# Patient Record
Sex: Male | Born: 1935 | Race: Black or African American | Hispanic: No | Marital: Married | Smoking: Current some day smoker
Health system: Southern US, Community
[De-identification: ages and names within clinical notes are randomized; demographics above are authoritative.]

## PROBLEM LIST (undated history)

## (undated) DIAGNOSIS — D72829 Elevated white blood cell count, unspecified: Secondary | ICD-10-CM

## (undated) DIAGNOSIS — N1330 Unspecified hydronephrosis: Secondary | ICD-10-CM

## (undated) DIAGNOSIS — Z992 Dependence on renal dialysis: Secondary | ICD-10-CM

## (undated) DIAGNOSIS — E0590 Thyrotoxicosis, unspecified without thyrotoxic crisis or storm: Secondary | ICD-10-CM

## (undated) DIAGNOSIS — R339 Retention of urine, unspecified: Secondary | ICD-10-CM

## (undated) DIAGNOSIS — A530 Latent syphilis, unspecified as early or late: Secondary | ICD-10-CM

## (undated) DIAGNOSIS — J189 Pneumonia, unspecified organism: Secondary | ICD-10-CM

## (undated) DIAGNOSIS — G9340 Encephalopathy, unspecified: Secondary | ICD-10-CM

## (undated) DIAGNOSIS — Z978 Presence of other specified devices: Secondary | ICD-10-CM

## (undated) DIAGNOSIS — R262 Difficulty in walking, not elsewhere classified: Secondary | ICD-10-CM

## (undated) DIAGNOSIS — M6281 Muscle weakness (generalized): Secondary | ICD-10-CM

## (undated) DIAGNOSIS — N390 Urinary tract infection, site not specified: Secondary | ICD-10-CM

## (undated) DIAGNOSIS — N320 Bladder-neck obstruction: Secondary | ICD-10-CM

## (undated) DIAGNOSIS — K5900 Constipation, unspecified: Secondary | ICD-10-CM

## (undated) DIAGNOSIS — Z960 Presence of urogenital implants: Secondary | ICD-10-CM

## (undated) DIAGNOSIS — N2589 Other disorders resulting from impaired renal tubular function: Secondary | ICD-10-CM

## (undated) DIAGNOSIS — E46 Unspecified protein-calorie malnutrition: Secondary | ICD-10-CM

## (undated) DIAGNOSIS — N179 Acute kidney failure, unspecified: Secondary | ICD-10-CM

## (undated) DIAGNOSIS — D649 Anemia, unspecified: Secondary | ICD-10-CM

---

## 2015-06-01 DIAGNOSIS — J189 Pneumonia, unspecified organism: Secondary | ICD-10-CM

## 2015-06-01 HISTORY — DX: Pneumonia, unspecified organism: J18.9

## 2015-06-13 ENCOUNTER — Emergency Department (HOSPITAL_COMMUNITY): Payer: Medicare Other

## 2015-06-13 ENCOUNTER — Encounter (HOSPITAL_COMMUNITY): Payer: Self-pay | Admitting: *Deleted

## 2015-06-13 ENCOUNTER — Inpatient Hospital Stay (HOSPITAL_COMMUNITY)
Admission: EM | Admit: 2015-06-13 | Discharge: 2015-06-28 | DRG: 871 | Disposition: A | Discharge status: Skilled Nursing Facility | Payer: Medicare Other | Attending: Internal Medicine | Admitting: Internal Medicine

## 2015-06-13 DIAGNOSIS — G9340 Encephalopathy, unspecified: Secondary | ICD-10-CM | POA: Diagnosis present

## 2015-06-13 DIAGNOSIS — E872 Acidosis: Secondary | ICD-10-CM | POA: Diagnosis present

## 2015-06-13 DIAGNOSIS — Z6823 Body mass index (BMI) 23.0-23.9, adult: Secondary | ICD-10-CM

## 2015-06-13 DIAGNOSIS — N390 Urinary tract infection, site not specified: Secondary | ICD-10-CM | POA: Diagnosis present

## 2015-06-13 DIAGNOSIS — K5900 Constipation, unspecified: Secondary | ICD-10-CM | POA: Diagnosis present

## 2015-06-13 DIAGNOSIS — N320 Bladder-neck obstruction: Secondary | ICD-10-CM | POA: Diagnosis present

## 2015-06-13 DIAGNOSIS — D638 Anemia in other chronic diseases classified elsewhere: Secondary | ICD-10-CM | POA: Diagnosis present

## 2015-06-13 DIAGNOSIS — D72829 Elevated white blood cell count, unspecified: Secondary | ICD-10-CM | POA: Diagnosis present

## 2015-06-13 DIAGNOSIS — D649 Anemia, unspecified: Secondary | ICD-10-CM | POA: Diagnosis present

## 2015-06-13 DIAGNOSIS — R339 Retention of urine, unspecified: Secondary | ICD-10-CM | POA: Diagnosis present

## 2015-06-13 DIAGNOSIS — A530 Latent syphilis, unspecified as early or late: Secondary | ICD-10-CM | POA: Diagnosis present

## 2015-06-13 DIAGNOSIS — R109 Unspecified abdominal pain: Secondary | ICD-10-CM

## 2015-06-13 DIAGNOSIS — Y95 Nosocomial condition: Secondary | ICD-10-CM | POA: Diagnosis not present

## 2015-06-13 DIAGNOSIS — N179 Acute kidney failure, unspecified: Secondary | ICD-10-CM | POA: Diagnosis present

## 2015-06-13 DIAGNOSIS — R509 Fever, unspecified: Secondary | ICD-10-CM | POA: Insufficient documentation

## 2015-06-13 DIAGNOSIS — N1330 Unspecified hydronephrosis: Secondary | ICD-10-CM | POA: Diagnosis present

## 2015-06-13 DIAGNOSIS — R030 Elevated blood-pressure reading, without diagnosis of hypertension: Secondary | ICD-10-CM

## 2015-06-13 DIAGNOSIS — J189 Pneumonia, unspecified organism: Secondary | ICD-10-CM | POA: Diagnosis not present

## 2015-06-13 DIAGNOSIS — A419 Sepsis, unspecified organism: Principal | ICD-10-CM | POA: Diagnosis present

## 2015-06-13 DIAGNOSIS — N401 Enlarged prostate with lower urinary tract symptoms: Secondary | ICD-10-CM | POA: Diagnosis present

## 2015-06-13 DIAGNOSIS — N19 Unspecified kidney failure: Secondary | ICD-10-CM | POA: Insufficient documentation

## 2015-06-13 DIAGNOSIS — R531 Weakness: Secondary | ICD-10-CM | POA: Diagnosis present

## 2015-06-13 DIAGNOSIS — R066 Hiccough: Secondary | ICD-10-CM | POA: Diagnosis not present

## 2015-06-13 DIAGNOSIS — E43 Unspecified severe protein-calorie malnutrition: Secondary | ICD-10-CM | POA: Diagnosis present

## 2015-06-13 DIAGNOSIS — K625 Hemorrhage of anus and rectum: Secondary | ICD-10-CM | POA: Diagnosis present

## 2015-06-13 DIAGNOSIS — E0590 Thyrotoxicosis, unspecified without thyrotoxic crisis or storm: Secondary | ICD-10-CM | POA: Diagnosis present

## 2015-06-13 DIAGNOSIS — J181 Lobar pneumonia, unspecified organism: Secondary | ICD-10-CM | POA: Diagnosis not present

## 2015-06-13 DIAGNOSIS — R4182 Altered mental status, unspecified: Secondary | ICD-10-CM

## 2015-06-13 DIAGNOSIS — IMO0001 Reserved for inherently not codable concepts without codable children: Secondary | ICD-10-CM | POA: Diagnosis present

## 2015-06-13 DIAGNOSIS — R630 Anorexia: Secondary | ICD-10-CM | POA: Diagnosis present

## 2015-06-13 DIAGNOSIS — E440 Moderate protein-calorie malnutrition: Secondary | ICD-10-CM | POA: Diagnosis present

## 2015-06-13 HISTORY — DX: Constipation, unspecified: K59.00

## 2015-06-13 LAB — URINALYSIS, ROUTINE W REFLEX MICROSCOPIC
Glucose, UA: NEGATIVE mg/dL
KETONES UR: 15 mg/dL — AB
NITRITE: POSITIVE — AB
PH: 7 (ref 5.0–8.0)
Protein, ur: 100 mg/dL — AB
Specific Gravity, Urine: 1.012 (ref 1.005–1.030)

## 2015-06-13 LAB — URINE MICROSCOPIC-ADD ON: Squamous Epithelial / HPF: NONE SEEN

## 2015-06-13 LAB — COMPREHENSIVE METABOLIC PANEL
ALBUMIN: 2.6 g/dL — AB (ref 3.5–5.0)
ALK PHOS: 203 U/L — AB (ref 38–126)
ALT: 62 U/L (ref 17–63)
AST: 40 U/L (ref 15–41)
Anion gap: 24 — ABNORMAL HIGH (ref 5–15)
BILIRUBIN TOTAL: 1.3 mg/dL — AB (ref 0.3–1.2)
BUN: 160 mg/dL — AB (ref 6–20)
CALCIUM: 8.8 mg/dL — AB (ref 8.9–10.3)
CO2: 15 mmol/L — AB (ref 22–32)
Chloride: 101 mmol/L (ref 101–111)
Creatinine, Ser: 14.21 mg/dL — ABNORMAL HIGH (ref 0.61–1.24)
GFR calc Af Amer: 3 mL/min — ABNORMAL LOW (ref >60)
GFR calc non Af Amer: 3 mL/min — ABNORMAL LOW (ref >60)
GLUCOSE: 110 mg/dL — AB (ref 65–99)
Potassium: 5 mmol/L (ref 3.5–5.1)
SODIUM: 140 mmol/L (ref 135–145)
TOTAL PROTEIN: 8 g/dL (ref 6.5–8.1)

## 2015-06-13 LAB — CBC
HEMATOCRIT: 34.4 % — AB (ref 39.0–52.0)
HEMOGLOBIN: 11.7 g/dL — AB (ref 13.0–17.0)
MCH: 27.9 pg (ref 26.0–34.0)
MCHC: 34 g/dL (ref 30.0–36.0)
MCV: 82.1 fL (ref 78.0–100.0)
Platelets: 383 10*3/uL (ref 150–400)
RBC: 4.19 MIL/uL — ABNORMAL LOW (ref 4.22–5.81)
RDW: 13.9 % (ref 11.5–15.5)
WBC: 19.3 10*3/uL — ABNORMAL HIGH (ref 4.0–10.5)

## 2015-06-13 LAB — PROTIME-INR
INR: 1.41 (ref 0.00–1.49)
Prothrombin Time: 17.3 s — ABNORMAL HIGH (ref 11.6–15.2)

## 2015-06-13 LAB — LIPASE, BLOOD: Lipase: 54 U/L — ABNORMAL HIGH (ref 11–51)

## 2015-06-13 LAB — LACTIC ACID, PLASMA: Lactic Acid, Venous: 1.1 mmol/L (ref 0.5–2.0)

## 2015-06-13 LAB — APTT: aPTT: 34 seconds (ref 24–37)

## 2015-06-13 MED ORDER — DEXTROSE 5 % IV SOLN
2.0000 g | INTRAVENOUS | Status: DC
  Administered 2015-06-14 – 2015-06-19 (×6): 2 g via INTRAVENOUS
  Filled 2015-06-13 (×8): qty 2

## 2015-06-13 MED ORDER — OXYCODONE HCL 5 MG PO TABS
5.0000 mg | ORAL_TABLET | ORAL | Status: DC | PRN
Start: 2015-06-13 — End: 2015-06-22
  Administered 2015-06-13 – 2015-06-19 (×7): 5 mg via ORAL
  Filled 2015-06-13 (×8): qty 1

## 2015-06-13 MED ORDER — SODIUM CHLORIDE 0.9 % IV SOLN
INTRAVENOUS | Status: DC
  Administered 2015-06-13: 22:00:00 via INTRAVENOUS

## 2015-06-13 MED ORDER — ACETAMINOPHEN 325 MG PO TABS
650.0000 mg | ORAL_TABLET | Freq: Four times a day (QID) | ORAL | Status: DC | PRN
  Administered 2015-06-19 – 2015-06-20 (×3): 650 mg via ORAL
  Filled 2015-06-13 (×3): qty 2

## 2015-06-13 MED ORDER — DEXTROSE 5 % IV SOLN
2.0000 g | Freq: Once | INTRAVENOUS | Status: AC
  Administered 2015-06-13: 2 g via INTRAVENOUS
  Filled 2015-06-13: qty 2

## 2015-06-13 MED ORDER — HYDRALAZINE HCL 20 MG/ML IJ SOLN: 2.0000 mg | Freq: Four times a day (QID) | INTRAMUSCULAR | Status: DC | PRN

## 2015-06-13 MED ORDER — ALUM & MAG HYDROXIDE-SIMETH 200-200-20 MG/5ML PO SUSP: 30.0000 mL | Freq: Four times a day (QID) | ORAL | Status: DC | PRN

## 2015-06-13 MED ORDER — SODIUM CHLORIDE 0.9 % IV BOLUS (SEPSIS)
1000.0000 mL | Freq: Once | INTRAVENOUS | Status: AC
  Administered 2015-06-13: 1000 mL via INTRAVENOUS

## 2015-06-13 MED ORDER — ONDANSETRON HCL 4 MG PO TABS: 4.0000 mg | ORAL_TABLET | Freq: Four times a day (QID) | ORAL | Status: DC | PRN

## 2015-06-13 MED ORDER — ENSURE ENLIVE PO LIQD
237.0000 mL | Freq: Two times a day (BID) | ORAL | Status: DC
  Administered 2015-06-14 – 2015-06-20 (×5): 237 mL via ORAL

## 2015-06-13 MED ORDER — HYDROMORPHONE HCL 1 MG/ML IJ SOLN: 0.5000 mg | INTRAMUSCULAR | Status: DC | PRN

## 2015-06-13 MED ORDER — ACETAMINOPHEN 650 MG RE SUPP: 650.0000 mg | Freq: Four times a day (QID) | RECTAL | Status: DC | PRN

## 2015-06-13 MED ORDER — ONDANSETRON HCL 4 MG/2ML IJ SOLN: 4.0000 mg | Freq: Four times a day (QID) | INTRAMUSCULAR | Status: DC | PRN

## 2015-06-13 NOTE — ED Notes (Signed)
Unsuccessful IV attempt; A Nease CN reports will attempt IV.

## 2015-06-13 NOTE — ED Notes (Signed)
Delay in bladder scan due to portable equipment person taking the scanner and also ultrasound in the room.

## 2015-06-13 NOTE — Progress Notes (Signed)
Pharmacy Antibiotic Note  Jeff Parks is a 82 y.o. male admitted on 06/13/2015 with urinary retention x3days, acute renal failure (SCr 14), and r/o sepsis.  Pharmacy has been consulted for Ceftriaxone dosing for UTI.  Plan:  Ceftriaxone 2g IV q24h  Follow up renal fxn, culture results, and clinical course.  Temp (24hrs), Avg:98.2 F (36.8 C), Min:98.2 F (36.8 C), Max:98.2 F (36.8 C)   Recent Labs Lab 06/13/15 1116  WBC 19.3*  CREATININE 14.21*    CrCl cannot be calculated (Unknown ideal weight.).    No Known Allergies  Antimicrobials this admission: 2/13 >> Ceftriaxone >>  Dose adjustments this admission: None  Microbiology results: 2/13 BCx: 2/13 UCx:   Thank you for allowing pharmacy to be a part of this patient's care.  Lynann Beaver PharmD, BCPS Pager [phone] 06/13/2015 6:27 PM

## 2015-06-13 NOTE — Progress Notes (Signed)
After reviewing orders, noted pt has cardiac monitoring orders. Call out to Dr. Mort Sawyers to see if continued cardiac monitoring is needed. Call back from Dr. Lovell Sheehan, she discontinued cardiac monitoring orders. Pt to remain on med surg 5W unit.

## 2015-06-13 NOTE — H&P (Signed)
[facility] Admission History and Physical       Jeff Parks WUJ:[id number] DOB: Jan 27, 1930 DOA: 06/13/2015   Referring physician: EDP PCP: No primary care provider on file.  Specialists:   Chief Complaint: Unable to Urinate  HPI: Jeff Parks is a 82 y.o. male with no previous medical problems who presents to the ED with complaints of Urinary retention x 3- 4 days.   He reports not being able to urinate for the past 12 hours.   He began to have fever today as well.    He was evaluated in the ED, and a foley catheter was placed in the ED, and his BUN/Cr was elevated, and a Renal US was ordered.   He was referred for admission.      Review of Systems:  Constitutional: No Weight Loss, No Weight Gain, Night Sweats, Fevers, Chills, Dizziness, Light Headedness, Fatigue, or Generalized Weakness HEENT: No Headaches, Difficulty Swallowing,Tooth/Dental Problems,Sore Throat,  No Sneezing, Rhinitis, Ear Ache, Nasal Congestion, or Post Nasal Drip,  Cardio-vascular:  No Chest pain, Orthopnea, PND, Edema in Lower Extremities, Anasarca, Dizziness, Palpitations  Resp: No Dyspnea, No DOE, No Productive Cough, No Non-Productive Cough, No Hemoptysis, No Wheezing.    GI: No Heartburn, Indigestion, +Abdominal Pain, Nausea, Vomiting, Diarrhea, Constipation, Hematemesis, Hematochezia, Melena, Change in Bowel Habits,  +Loss of Appetite  GU: No Dysuria, No Change in Color of Urine, +Urinary Retention, No Urgency or Urinary Frequency, No Flank pain.  Musculoskeletal: No Joint Pain or Swelling, No Decreased Range of Motion, No Back Pain.  Neurologic: No Syncope, No Seizures, Muscle Weakness, Paresthesia, Vision Disturbance or Loss, No Diplopia, No Vertigo, No Difficulty Walking,  Skin: No Rash or Lesions. Psych: No Change in Mood or Affect, No Depression or Anxiety, No Memory loss, No Confusion, or Hallucinations   Past Medical History  Diagnosis Date  . Constipation      History reviewed. No  pertinent past surgical history.    Prior to Admission medications   Medication Sig Start Date End Date Taking? Authorizing Provider  Multiple Vitamins-Minerals (MULTIVITAMIN ADULT PO) Take 1 tablet by mouth daily.   Yes Historical Provider, MD  naproxen sodium (ANAPROX) 220 MG tablet Take 400 mg by mouth every 12 (twelve) hours as needed (PAIN).   Yes Historical Provider, MD     No Known Allergies    Social History:  reports that he has never smoked. He does not have any smokeless tobacco history on file. He reports that he does not drink alcohol or use illicit drugs.     Family History :    Mother had Diabetes  Father had Anemia    Physical Exam:  GEN:  Pleasant Thin Elderly  82 y.o. African American male examined and in no acute distress; cooperative with exam Filed Vitals:   06/13/15 0949 06/13/15 1425  BP: 150/69 150/94  Pulse: 107 105  Temp: 98.2 F (36.8 C)   TempSrc: Oral   Resp: 18 14  SpO2: 98% 100%   Blood pressure 150/94, pulse 105, temperature 98.2 F (36.8 C), temperature source Oral, resp. rate 14, SpO2 100 %. PSYCH: He is alert and oriented x4; does not appear anxious does not appear depressed; affect is normal HEENT: Normocephalic and Atraumatic, Mucous membranes pink; PERRLA; EOM intact; Fundi:  Benign;  No scleral icterus, Nares: Patent, Oropharynx: Clear, Edentulous,    Neck:  FROM, No Cervical Lymphadenopathy nor Thyromegaly or Carotid Bruit; No JVD; Breasts:: Not examined CHEST WALL: No tenderness CHEST: Normal respiration, clear  to auscultation bilaterally HEART: Regular rate and rhythm; no murmurs rubs or gallops BACK: No kyphosis or scoliosis; No CVA tenderness ABDOMEN: Positive Bowel Sounds, Scaphoid, Soft Non-Tender, No Rebound or Guarding; No Masses, No Organomegaly Rectal Exam: Not done EXTREMITIES: No Cyanosis, Clubbing, or Edema; No Ulcerations. Genitalia: not examined PULSES: 2+ and symmetric SKIN: Normal hydration no rash or  ulceration CNS:  Alert and Oriented x 4, No Focal Deficits Vascular: pulses palpable throughout    Labs on Admission:  Basic Metabolic Panel:  Recent Labs Lab 06/13/15 1116  NA 140  K 5.0  CL 101  CO2 15*  GLUCOSE 110*  BUN 160*  CREATININE 14.21*  CALCIUM 8.8*   Liver Function Tests:  Recent Labs Lab 06/13/15 1116  AST 40  ALT 62  ALKPHOS 203*  BILITOT 1.3*  PROT 8.0  ALBUMIN 2.6*    Recent Labs Lab 06/13/15 1116  LIPASE 54*   No results for input(s): AMMONIA in the last 168 hours. CBC:  Recent Labs Lab 06/13/15 1116  WBC 19.3*  HGB 11.7*  HCT 34.4*  MCV 82.1  PLT 383   Cardiac Enzymes: No results for input(s): CKTOTAL, CKMB, CKMBINDEX, TROPONINI in the last 168 hours.  BNP (last 3 results) No results for input(s): BNP in the last 8760 hours.  ProBNP (last 3 results) No results for input(s): PROBNP in the last 8760 hours.  CBG: No results for input(s): GLUCAP in the last 168 hours.  Radiological Exams on Admission: Dg Chest 2 View  06/13/2015  CLINICAL DATA:  Shortness of breath for 5 days, constipation for 5 days, weakness EXAM: CHEST  2 VIEW COMPARISON:  None FINDINGS: Normal heart size and pulmonary vascularity. Tortuous thoracic aorta. Bronchitic changes with LEFT basilar atelectasis. Remaining lungs clear. No definite pleural effusion or pneumothorax. IMPRESSION: Bronchitic changes with LEFT basilar atelectasis. Electronically Signed   By: Ulyses Southward M.D.   On: 06/13/2015 17:31   Dg Abd 2 Views  06/13/2015  CLINICAL DATA:  Shortness of breath for 5 days, constipation for 5 days, weakness EXAM: ABDOMEN - 2 VIEW COMPARISON:  None FINDINGS: Bronchitic changes with subsegmental atelectasis at LEFT base. Normal bowel gas pattern. No bowel dilatation, bowel wall thickening or free intraperitoneal air. Probable nipple shadows on upright view. Numerous pelvic phleboliths. No acute osseous findings or urinary tract calcification. IMPRESSION: No acute  abdominal findings. Electronically Signed   By: Ulyses Southward M.D.   On: 06/13/2015 17:32    EKG:   Sinus Tachycardia rate =104   Assessment/Plan:   82 y.o. male with  Active Problems:       1.    AKI (acute kidney injury) (HCC)/Renal failure- due to BOO    Foley Catheter Placed    IVFs    Monitor BUN/Cr      2.    Urinary retention- due to BOO    Renal US ordered    Check PSA    May Need Urology Consult    Monitor Output      3.    Fever    Sepsis Workup initiated    IV Rocephin      4.    Anorexia    Nutrition consult    5.     Anemia    Send Anemia Panel in AM    6.     Leukocytosis- due to Early Sepsis    Monitor Trend     7.     Elevated Blood Pressures- due to HTN,  or due  Pain and Discomfort    Monitor BPs      8.    DVT Prophylaxis    Lovenox      Code Status:     FULL CODE        Family Communication:   Wife at Bedside   Disposition Plan:   Observation Status with Expected LOS 1-2 days      Time spent: 87 Minutes      Ron Parker [facility] Pager [phone]   If 7AM -7PM Please Contact the Day Rounding Team MD for [facility]  If 7PM-7AM, Please Contact Night-Floor Coverage  [email or web address] Password [email or web address] 06/13/2015, 5:53 PM     ADDENDUM:   Patient was seen and examined on 06/13/2015

## 2015-06-13 NOTE — ED Notes (Signed)
Multiple IV attempts by nurses to start IV; main lab to draw pending labs.

## 2015-06-13 NOTE — ED Provider Notes (Signed)
CSN: [id number]     Arrival date & time 06/13/15  0919 History   First MD Initiated Contact with Patient 06/13/15 1537     Chief Complaint  Patient presents with  . no appetite   . Constipation      Patient is a 82 y.o. male presenting with constipation. The history is provided by the patient.  Constipation Severity:  Moderate Associated symptoms: no abdominal pain, no back pain, no dysuria and no nausea    patient states he's been feeling weak over the last week. Has had a decreased oral intake and appetite. Has had some constipation. He is having a hard time describing what stool is light. States his abdomen is not distended. States he has possibly had some decreased urination. States his wife but no more of the stuff about him and he does. May have had some weight loss. No fevers. States he had a good appetite around a week ago for a birthday party. Somewhat poor historian overall. States she's not seen his doctor in a while. He  Past Medical History  Diagnosis Date  . Constipation    History reviewed. No pertinent past surgical history. No family history on file. Social History  Substance Use Topics  . Smoking status: Never Smoker   . Smokeless tobacco: None  . Alcohol Use: No    Review of Systems  Constitutional: Positive for activity change, appetite change and fatigue.  Cardiovascular: Negative for leg swelling.  Gastrointestinal: Positive for constipation. Negative for nausea and abdominal pain.  Genitourinary: Positive for difficulty urinating. Negative for dysuria.  Musculoskeletal: Negative for back pain.      Allergies  Review of patient's allergies indicates no known allergies.  Home Medications   Prior to Admission medications   Medication Sig Start Date End Date Taking? Authorizing Provider  Multiple Vitamins-Minerals (MULTIVITAMIN ADULT PO) Take 1 tablet by mouth daily.   Yes Historical Provider, MD  naproxen sodium (ANAPROX) 220 MG tablet Take 400 mg by  mouth every 12 (twelve) hours as needed (PAIN).   Yes Historical Provider, MD   BP 159/66 mmHg  Pulse 110  Temp(Src) 98.1 F (36.7 C) (Oral)  Resp 18  Wt 179 lb 10.8 oz (81.5 kg)  SpO2 100% Physical Exam  Cardiovascular:  Mild tachycardia  Pulmonary/Chest: Effort normal.  Abdominal: He exhibits distension and mass.  Inferior abdominal mass.  Musculoskeletal: He exhibits no edema.  Neurological: He is alert.  Skin: Skin is warm.    ED Course  Procedures (including critical care time) Labs Review Labs Reviewed  LIPASE, BLOOD - Abnormal; Notable for the following:    Lipase 54 (*)    All other components within normal limits  COMPREHENSIVE METABOLIC PANEL - Abnormal; Notable for the following:    CO2 15 (*)    Glucose, Bld 110 (*)    BUN 160 (*)    Creatinine, Ser 14.21 (*)    Calcium 8.8 (*)    Albumin 2.6 (*)    Alkaline Phosphatase 203 (*)    Total Bilirubin 1.3 (*)    GFR calc non Af Amer 3 (*)    GFR calc Af Amer 3 (*)    Anion gap 24 (*)    All other components within normal limits  CBC - Abnormal; Notable for the following:    WBC 19.3 (*)    RBC 4.19 (*)    Hemoglobin 11.7 (*)    HCT 34.4 (*)    All other components within normal limits  PROTIME-INR - Abnormal; Notable for the following:    Prothrombin Time 17.3 (*)    All other components within normal limits  URINALYSIS, ROUTINE W REFLEX MICROSCOPIC (NOT AT [facility]) - Abnormal; Notable for the following:    Color, Urine RED (*)    APPearance TURBID (*)    Hgb urine dipstick LARGE (*)    Bilirubin Urine MODERATE (*)    Ketones, ur 15 (*)    Protein, ur 100 (*)    Nitrite POSITIVE (*)    Leukocytes, UA LARGE (*)    All other components within normal limits  URINE MICROSCOPIC-ADD ON - Abnormal; Notable for the following:    Bacteria, UA FEW (*)    All other components within normal limits  CULTURE, BLOOD (ROUTINE X 2)  CULTURE, BLOOD (ROUTINE X 2)  LACTIC ACID, PLASMA  PROCALCITONIN  APTT  LACTIC  ACID, PLASMA  VITAMIN B12  FOLATE  IRON AND TIBC  FERRITIN  RETICULOCYTES  BASIC METABOLIC PANEL  CBC    Imaging Review Dg Chest 2 View  06/13/2015  CLINICAL DATA:  Shortness of breath for 5 days, constipation for 5 days, weakness EXAM: CHEST  2 VIEW COMPARISON:  None FINDINGS: Normal heart size and pulmonary vascularity. Tortuous thoracic aorta. Bronchitic changes with LEFT basilar atelectasis. Remaining lungs clear. No definite pleural effusion or pneumothorax. IMPRESSION: Bronchitic changes with LEFT basilar atelectasis. Electronically Signed   By: Ulyses Southward M.D.   On: 06/13/2015 17:31   US Renal  06/13/2015  CLINICAL DATA:  Renal failure EXAM: RENAL / URINARY TRACT ULTRASOUND COMPLETE COMPARISON:  None. FINDINGS: Right Kidney: Length: 12.6 cm. Normal echogenicity. Mild hydronephrosis. There is a 12 cm renal cyst and a 3 cm renal cyst, both of which appear relatively simple. Left Kidney: Length: 12.7 cm. Mild hydronephrosis. Normal echogenicity. 14 cm cysts with numerous internal septations and wall thickening. Bladder: Numerous bladder calculi identified. There is debris layering within the bladder and the largest calculus measures about 13 mm. Prostate is enlarged at 5.4 cm. Incidentally detected is a wall echo shadow complex involving the gallbladder indicating cholelithiasis. IMPRESSION: 1. Extensive cholelithiasis 2. Enlarged prostate 3. Bladder calculi 4. Large bilateral renal cysts. On the left, a 14 cm cyst shows a complex appearance and malignancy is not excluded. Further evaluation with renal protocol CT scan or MRI suggested. 5. Mild bilateral hydronephrosis Electronically Signed   By: Esperanza Heir M.D.   On: 06/13/2015 18:48   Dg Abd 2 Views  06/13/2015  CLINICAL DATA:  Shortness of breath for 5 days, constipation for 5 days, weakness EXAM: ABDOMEN - 2 VIEW COMPARISON:  None FINDINGS: Bronchitic changes with subsegmental atelectasis at LEFT base. Normal bowel gas pattern. No  bowel dilatation, bowel wall thickening or free intraperitoneal air. Probable nipple shadows on upright view. Numerous pelvic phleboliths. No acute osseous findings or urinary tract calcification. IMPRESSION: No acute abdominal findings. Electronically Signed   By: Ulyses Southward M.D.   On: 06/13/2015 17:32   I have personally reviewed and evaluated these images and lab results as part of my medical decision-making.   EKG Interpretation   Date/Time:  Monday June 13 2015 16:39:49 EST Ventricular Rate:  104 PR Interval:  163 QRS Duration: 90 QT Interval:  357 QTC Calculation: 470 R Axis:   20 Text Interpretation:  Sinus tachycardia Confirmed by Rubin Payor  MD, Harrold Donath  [phone]) on 06/13/2015 5:09:31 PM      MDM   Final diagnoses:  Weakness  Renal failure  Urinary retention   Patient with generalized weakness constipation and decreased appetite. Found to be in acute renal failure with a BUN is 160 and a creatinine of 14. Bedside ultrasound showed large distended bladder. Foley catheter to be placed. With this level renal failure patient will require admission.     Benjiman Core, MD 06/14/15 0020

## 2015-06-13 NOTE — ED Notes (Signed)
PT CAN GO UP AT 1930

## 2015-06-13 NOTE — ED Notes (Signed)
Bed: WA10 Expected date:  Expected time:  Means of arrival:  Comments: Hall C 

## 2015-06-13 NOTE — ED Notes (Signed)
Pt transported to [facility]; will bladder scan/insert foley with pt return.

## 2015-06-13 NOTE — Progress Notes (Signed)
Utilization Review completed.  Damond Borchers RN CM  

## 2015-06-13 NOTE — ED Notes (Signed)
Pt's wife reports constipation and decreased appetite with weakness x 1 week.

## 2015-06-13 NOTE — ED Notes (Signed)
Called pt for lab work - currently in bathroom per wife.  Will check back in a few minutes.

## 2015-06-14 DIAGNOSIS — R630 Anorexia: Secondary | ICD-10-CM

## 2015-06-14 DIAGNOSIS — D72829 Elevated white blood cell count, unspecified: Secondary | ICD-10-CM

## 2015-06-14 DIAGNOSIS — R339 Retention of urine, unspecified: Secondary | ICD-10-CM

## 2015-06-14 DIAGNOSIS — N19 Unspecified kidney failure: Secondary | ICD-10-CM

## 2015-06-14 DIAGNOSIS — R509 Fever, unspecified: Secondary | ICD-10-CM

## 2015-06-14 DIAGNOSIS — N179 Acute kidney failure, unspecified: Secondary | ICD-10-CM

## 2015-06-14 DIAGNOSIS — K625 Hemorrhage of anus and rectum: Secondary | ICD-10-CM | POA: Diagnosis present

## 2015-06-14 LAB — BASIC METABOLIC PANEL
ANION GAP: 22 — AB (ref 5–15)
BUN: 151 mg/dL — AB (ref 6–20)
CO2: 15 mmol/L — ABNORMAL LOW (ref 22–32)
Calcium: 8.3 mg/dL — ABNORMAL LOW (ref 8.9–10.3)
Chloride: 106 mmol/L (ref 101–111)
Creatinine, Ser: 12.57 mg/dL — ABNORMAL HIGH (ref 0.61–1.24)
GFR calc Af Amer: 4 mL/min — ABNORMAL LOW (ref >60)
GFR calc non Af Amer: 3 mL/min — ABNORMAL LOW (ref >60)
Glucose, Bld: 127 mg/dL — ABNORMAL HIGH (ref 65–99)
POTASSIUM: 4.4 mmol/L (ref 3.5–5.1)
SODIUM: 143 mmol/L (ref 135–145)

## 2015-06-14 LAB — PROTEIN / CREATININE RATIO, URINE
Creatinine, Urine: 106.16 mg/dL
PROTEIN CREATININE RATIO: 1.27 mg/mg{creat} — AB (ref 0.00–0.15)
TOTAL PROTEIN, URINE: 135 mg/dL

## 2015-06-14 LAB — IRON AND TIBC
IRON: 47 ug/dL (ref 45–182)
Saturation Ratios: 37 % (ref 17.9–39.5)
TIBC: 126 ug/dL — ABNORMAL LOW (ref 250–450)
UIBC: 79 ug/dL

## 2015-06-14 LAB — LACTIC ACID, PLASMA: LACTIC ACID, VENOUS: 0.8 mmol/L (ref 0.5–2.0)

## 2015-06-14 LAB — FOLATE: FOLATE: 12.8 ng/mL (ref >5.9)

## 2015-06-14 LAB — FERRITIN: Ferritin: 987 ng/mL — ABNORMAL HIGH (ref 24–336)

## 2015-06-14 LAB — CBC
HEMATOCRIT: 31.8 % — AB (ref 39.0–52.0)
HEMOGLOBIN: 11 g/dL — AB (ref 13.0–17.0)
MCH: 28.2 pg (ref 26.0–34.0)
MCHC: 34.6 g/dL (ref 30.0–36.0)
MCV: 81.5 fL (ref 78.0–100.0)
Platelets: 390 10*3/uL (ref 150–400)
RBC: 3.9 MIL/uL — ABNORMAL LOW (ref 4.22–5.81)
RDW: 13.9 % (ref 11.5–15.5)
WBC: 20.4 10*3/uL — AB (ref 4.0–10.5)

## 2015-06-14 LAB — RETICULOCYTES
RBC.: 3.9 MIL/uL — AB (ref 4.22–5.81)
RETIC COUNT ABSOLUTE: 27.3 10*3/uL (ref 19.0–186.0)
Retic Ct Pct: 0.7 % (ref 0.4–3.1)

## 2015-06-14 LAB — PROCALCITONIN: Procalcitonin: 7.12 ng/mL

## 2015-06-14 LAB — VITAMIN B12: VITAMIN B 12: 1686 pg/mL — AB (ref 180–914)

## 2015-06-14 LAB — OCCULT BLOOD X 1 CARD TO LAB, STOOL: Fecal Occult Bld: POSITIVE — AB

## 2015-06-14 MED ORDER — POLYETHYLENE GLYCOL 3350 17 G PO PACK: 17.0000 g | PACK | Freq: Every day | ORAL | Status: DC | PRN

## 2015-06-14 MED ORDER — SODIUM BICARBONATE 8.4 % IV SOLN
INTRAVENOUS | Status: DC
  Administered 2015-06-14 – 2015-06-16 (×3): via INTRAVENOUS
  Filled 2015-06-14 (×5): qty 100

## 2015-06-14 MED ORDER — DOCUSATE SODIUM 100 MG PO CAPS
100.0000 mg | ORAL_CAPSULE | Freq: Two times a day (BID) | ORAL | Status: DC
  Administered 2015-06-19 – 2015-06-27 (×12): 100 mg via ORAL
  Filled 2015-06-14 (×22): qty 1

## 2015-06-14 NOTE — Consult Note (Signed)
Urology Consult   Physician requesting consult: Della Goo  Reason for consult: Urinary retention  History of Present Illness: Jeff Parks is a 82 y.o. male with no significant PMH who presented to the ED yesterday with c/o loss of appetite, constipation, and reduced urine output.  He seems somewhat confused and is not able to provide an adequate hx, therefore, the wife provides most of the information.  She states he has been taking NSAIDs on a daily basis for quite some time and Miralax for constipation for approx 1 month.  She has noted fatigue and decreased appetite for approx 2 weeks.  She has also noticed his increased urinary frequency, urgency, and nocturia particularly over the past two weeks.  He denies incontinence, hematuria, and dysuria.  He has had difficulty starting his stream and a weak stream with feelings of incomplete emptying but he does not know for how long.    In the ED he was noted to have a Cr of 14.2, GFR 3, WBC 19.  UA was nitrite positive with increased RBCs, and WBCs.  RUS revealed bilateral mild hydro, normal kidney sizes, a lot of debris and stones (largest 13mm) in the bladder, an enlarged prostate, and large bilateral renal cysts with a 14mm cyst on the left that is complex appearing with malignancy not excluded.   Foley placed in the ED with initial return of 2.15L but has had little urine output since. Cr down to 12.57 today. WBC up to 20.  Pt is currently resting and his only complaint is of spasms and feeling the constant urge to defecate.  He has had a BM since admission.  Past Medical History  Diagnosis Date  . Constipation     History reviewed. No pertinent past surgical history.  Current Hospital Medications:  Home Meds:    Medication List    ASK your doctor about these medications        MULTIVITAMIN ADULT PO  Take 1 tablet by mouth daily.     naproxen sodium 220 MG tablet  Commonly known as:  ANAPROX  Take 400 mg by mouth every 12  (twelve) hours as needed (PAIN).        Scheduled Meds: . cefTRIAXone (ROCEPHIN)  IV  2 g Intravenous Q24H  . feeding supplement (ENSURE ENLIVE)  237 mL Oral BID BM   Continuous Infusions: .  sodium bicarbonate  infusion 1000 mL 125 mL/hr at 06/14/15 1115   PRN Meds:.acetaminophen **OR** acetaminophen, alum & mag hydroxide-simeth, hydrALAZINE, ondansetron **OR** ondansetron (ZOFRAN) IV, oxyCODONE  Allergies: No Known Allergies  No family history on file.  Social History:  reports that he has never smoked. He does not have any smokeless tobacco history on file. He reports that he does not drink alcohol or use illicit drugs.  ROS: A complete review of systems was performed.  All systems are negative except for pertinent findings as noted.  Physical Exam:  Vital signs in last 24 hours: Temp:  [98 F (36.7 C)-98.2 F (36.8 C)] 98 F (36.7 C) (02/14 1055) Pulse Rate:  [53-110] 107 (02/14 1055) Resp:  [14-18] 16 (02/14 1055) BP: (118-159)/(47-94) 118/47 mmHg (02/14 1055) SpO2:  [98 %-100 %] 99 % (02/14 1055) Weight:  [81.5 kg (179 lb 10.8 oz)] 81.5 kg (179 lb 10.8 oz) (02/13 2025) Constitutional:  Alert and oriented, No acute distress Cardiovascular: Regular rate and rhythm Respiratory: Normal respiratory effort GI: Abdomen is soft, nontender, nondistended, no abdominal masses GU: foley in place with very little  pink tinged urine in bag Lymphatic: No lymphadenopathy Neurologic: Grossly intact, no focal deficits Psychiatric: Normal mood and affect  Laboratory Data:   Recent Labs  06/13/15 1116 06/14/15 0250  WBC 19.3* 20.4*  HGB 11.7* 11.0*  HCT 34.4* 31.8*  PLT 383 390     Recent Labs  06/13/15 1116 06/14/15 0250  NA 140 143  K 5.0 4.4  CL 101 106  GLUCOSE 110* 127*  BUN 160* 151*  CALCIUM 8.8* 8.3*  CREATININE 14.21* 12.57*     Results for orders placed or performed during the hospital encounter of 06/13/15 (from the past 24 hour(s))  Urinalysis,  Routine w reflex microscopic (not at [facility])     Status: Abnormal   Collection Time: 06/13/15  7:19 PM  Result Value Ref Range   Color, Urine RED (A) YELLOW   APPearance TURBID (A) CLEAR   Specific Gravity, Urine 1.012 1.005 - 1.030   pH 7.0 5.0 - 8.0   Glucose, UA NEGATIVE NEGATIVE mg/dL   Hgb urine dipstick LARGE (A) NEGATIVE   Bilirubin Urine MODERATE (A) NEGATIVE   Ketones, ur 15 (A) NEGATIVE mg/dL   Protein, ur 161 (A) NEGATIVE mg/dL   Nitrite POSITIVE (A) NEGATIVE   Leukocytes, UA LARGE (A) NEGATIVE  Urine microscopic-add on     Status: Abnormal   Collection Time: 06/13/15  7:19 PM  Result Value Ref Range   Squamous Epithelial / LPF NONE SEEN NONE SEEN   WBC, UA TOO NUMEROUS TO COUNT 0 - 5 WBC/hpf   RBC / HPF TOO NUMEROUS TO COUNT 0 - 5 RBC/hpf   Bacteria, UA FEW (A) NONE SEEN   Urine-Other MICROSCOPIC EXAM PERFORMED ON UNCONCENTRATED URINE   Lactic acid, plasma     Status: None   Collection Time: 06/13/15 11:05 PM  Result Value Ref Range   Lactic Acid, Venous 1.1 0.5 - 2.0 mmol/L  Procalcitonin     Status: None   Collection Time: 06/13/15 11:05 PM  Result Value Ref Range   Procalcitonin 7.12 ng/mL  Protime-INR     Status: Abnormal   Collection Time: 06/13/15 11:05 PM  Result Value Ref Range   Prothrombin Time 17.3 (H) 11.6 - 15.2 seconds   INR 1.41 0.00 - 1.49  APTT     Status: None   Collection Time: 06/13/15 11:05 PM  Result Value Ref Range   aPTT 34 24 - 37 seconds  Lactic acid, plasma     Status: None   Collection Time: 06/14/15  2:47 AM  Result Value Ref Range   Lactic Acid, Venous 0.8 0.5 - 2.0 mmol/L  Vitamin B12     Status: Abnormal   Collection Time: 06/14/15  2:50 AM  Result Value Ref Range   Vitamin B-12 1686 (H) 180 - 914 pg/mL  Folate     Status: None   Collection Time: 06/14/15  2:50 AM  Result Value Ref Range   Folate 12.8 >5.9 ng/mL  Iron and TIBC     Status: Abnormal   Collection Time: 06/14/15  2:50 AM  Result Value Ref Range   Iron 47 45 -  182 ug/dL   TIBC 096 (L) 045 - 409 ug/dL   Saturation Ratios 37 17.9 - 39.5 %   UIBC 79 ug/dL  Ferritin     Status: Abnormal   Collection Time: 06/14/15  2:50 AM  Result Value Ref Range   Ferritin 987 (H) 24 - 336 ng/mL  Reticulocytes     Status: Abnormal  Collection Time: 06/14/15  2:50 AM  Result Value Ref Range   Retic Ct Pct 0.7 0.4 - 3.1 %   RBC. 3.90 (L) 4.22 - 5.81 MIL/uL   Retic Count, Manual 27.3 19.0 - 186.0 K/uL  Basic metabolic panel     Status: Abnormal   Collection Time: 06/14/15  2:50 AM  Result Value Ref Range   Sodium 143 135 - 145 mmol/L   Potassium 4.4 3.5 - 5.1 mmol/L   Chloride 106 101 - 111 mmol/L   CO2 15 (L) 22 - 32 mmol/L   Glucose, Bld 127 (H) 65 - 99 mg/dL   BUN 161 (H) 6 - 20 mg/dL   Creatinine, Ser 09.60 (H) 0.61 - 1.24 mg/dL   Calcium 8.3 (L) 8.9 - 10.3 mg/dL   GFR calc non Af Amer 3 (L) >60 mL/min   GFR calc Af Amer 4 (L) >60 mL/min   Anion gap 22 (H) 5 - 15  CBC     Status: Abnormal   Collection Time: 06/14/15  2:50 AM  Result Value Ref Range   WBC 20.4 (H) 4.0 - 10.5 K/uL   RBC 3.90 (L) 4.22 - 5.81 MIL/uL   Hemoglobin 11.0 (L) 13.0 - 17.0 g/dL   HCT 45.4 (L) 09.8 - 11.9 %   MCV 81.5 78.0 - 100.0 fL   MCH 28.2 26.0 - 34.0 pg   MCHC 34.6 30.0 - 36.0 g/dL   RDW 14.7 82.9 - 56.2 %   Platelets 390 150 - 400 K/uL   No results found for this or any previous visit (from the past 240 hour(s)).  Renal Function:  Recent Labs  06/13/15 1116 06/14/15 0250  CREATININE 14.21* 12.57*   Estimated Creatinine Clearance: 4.9 mL/min (by C-G formula based on Cr of 12.57).  Radiologic Imaging: Dg Chest 2 View  06/13/2015  CLINICAL DATA:  Shortness of breath for 5 days, constipation for 5 days, weakness EXAM: CHEST  2 VIEW COMPARISON:  None FINDINGS: Normal heart size and pulmonary vascularity. Tortuous thoracic aorta. Bronchitic changes with LEFT basilar atelectasis. Remaining lungs clear. No definite pleural effusion or pneumothorax. IMPRESSION:  Bronchitic changes with LEFT basilar atelectasis. Electronically Signed   By: Ulyses Southward M.D.   On: 06/13/2015 17:31   US Renal  06/13/2015  CLINICAL DATA:  Renal failure EXAM: RENAL / URINARY TRACT ULTRASOUND COMPLETE COMPARISON:  None. FINDINGS: Right Kidney: Length: 12.6 cm. Normal echogenicity. Mild hydronephrosis. There is a 12 cm renal cyst and a 3 cm renal cyst, both of which appear relatively simple. Left Kidney: Length: 12.7 cm. Mild hydronephrosis. Normal echogenicity. 14 cm cysts with numerous internal septations and wall thickening. Bladder: Numerous bladder calculi identified. There is debris layering within the bladder and the largest calculus measures about 13 mm. Prostate is enlarged at 5.4 cm. Incidentally detected is a wall echo shadow complex involving the gallbladder indicating cholelithiasis. IMPRESSION: 1. Extensive cholelithiasis 2. Enlarged prostate 3. Bladder calculi 4. Large bilateral renal cysts. On the left, a 14 cm cyst shows a complex appearance and malignancy is not excluded. Further evaluation with renal protocol CT scan or MRI suggested. 5. Mild bilateral hydronephrosis Electronically Signed   By: Esperanza Heir M.D.   On: 06/13/2015 18:48   Dg Abd 2 Views  06/13/2015  CLINICAL DATA:  Shortness of breath for 5 days, constipation for 5 days, weakness EXAM: ABDOMEN - 2 VIEW COMPARISON:  None FINDINGS: Bronchitic changes with subsegmental atelectasis at LEFT base. Normal bowel gas pattern. No bowel  dilatation, bowel wall thickening or free intraperitoneal air. Probable nipple shadows on upright view. Numerous pelvic phleboliths. No acute osseous findings or urinary tract calcification. IMPRESSION: No acute abdominal findings. Electronically Signed   By: Ulyses Southward M.D.   On: 06/13/2015 17:32     Impression/Recommendation  Urinary retention--this is likely an issue that has been going on for quite some time as he has bladder stones and he was not in pain on presentation  from an overly expanded bladder with 2L in it.  He has an enlarged prostate that is contributing but at this point also likely has a stretched/distended bladder from prolonged retention.  Maintain foley for bladder rest and appropriate kidney drainage. He will need rectal exam and possible cysto/urodynamics/TURP in the future. Pt has had constipation which can also contribute to retention and should be addressed.    IM has ordered a PSA, however, this will be falsely elevated in the setting of retention.    UTI--on Rocephin.  Culture pending.  F/u culture and treat appropriately for 7 days  Microhematuria--likely due to overly distended bladder and infection.  F/U UA to ensure resolution.  Bladder stones--not an acute issue but could be a nidus for infection.  Can be addressed at a later time (if the pt has a TURP they can be removed simultaneously).    Hydro--f/u renal U/S to ensure resolution  Complex left renal cyst--if Cr improves pt will need CT vs MRI to completely eval.    AKI--per nephrology.  Cr slightly improved with foley placement but pt with decreased UO since admission. He has agreed to HD if necessary.  Harrie Foreman 06/14/2015, 1:57 PM   Urology Attending Note: Pt seen and examined. Nephrology note noted. U/s reviewed. We will follow pt. He will most likely eventually need cysto and bladder stone removal. Poor prognosis for kidney recovery with gfr=3.

## 2015-06-14 NOTE — Progress Notes (Signed)
[facility] PROGRESS NOTE  Jeff Parks KDX:[id number] DOB: Sep 24, 1929 DOA: 06/13/2015 PCP: No primary care provider on file.  Brief Summary  The patient is an 82 year old male with no significant known past medical history but has not been to the doctor in many many years who presented with progressive weakness, clouded thinking, difficulty urinating and decreased urine output. He also also had hiccups for the last 5 days prior to admission and poor by mouth intake. He denied vomiting. He had some constipation that resolved with stool softeners and resulted in diarrhea.   Assessment/Plan  Acute kidney injury with uremia, likely partly secondary to bladder outlet obstruction from enlarged prostate and from NSAID use. He had been using ibuprofen 1-2 tabs per dose approximately 1-2 times per day and prior to that he had been using Naprosyn. -  Continue Foley catheter -  Start Flomax -  Continue IV fluids -  Nephrology consultation -  Change to renal diet -  Minimize nephrotoxins and renally dose medications -  Patient is teetering on needing hemodialysis  Bladder outlet obstruction -  Continue Foley catheter -  Start Flomax  Left complex renal cyst -  Urology consultation -  Consider noncontrast MRI versus CT scan For further delineation   Sepsis with fever, leukocytosis, tachycardia likely secondary to probable urinary tract infection, present at the time of admission  -  Continue ceftriaxone  -  Follow-up urine culture -  Chest x-ray without acute infiltrate  Constipation, unclear etiology.  No recent colonoscopy and already has a concerning mass on his left kidney.   -  Check TSH -  Conversing with urology about next imaging test, but hoping they will be okay with non-contrast CT of the abd/pelvis -  Colace -  Daily miralax -  Bisacodyl prn  Blood in stools, sounds like hemorrhoids related to constipation -  Clearly occult positive, but hemoglobin relatively stable -   Repeat CBC in AM -  If increased bleeding, may need to transfer to stepdown and get GI consultation  Leukocytosis, likely related to sepsis vs. AKI/acute obstruction -  Repeat in AM   Normocytic anemia -  B12, folate and iron levels wnl -  Elevated ferritin could be related to sepsis vs. Malignancy  + anion gap metabolic acidosis, likely due to uremia -  Sodium bicarbonate fluids per nephrology recommendation  Minimal elevation of bilirubin and alk phos and lipase concerning for dehydration -  Continue IVF and repeat in AM -  No symptoms of gallstones on history or exam -  RUQ [place] does demonstrate gallstones  Gait instability -  PT/OT assessments  Diet:  Renal diet  Access:  PIV IVF:  yes Proph:  SCDs  Code Status: full Family Communication: patient, his wife, and daughter Disposition Plan: Pending declaration of kidney function, further evaluation for possible malignancy   Consultants:  Nephrology  Urology  Procedures:  Renal ultrasound  Antibiotics:  Ceftriaxone from 2/13   HPI/Subjective:  Patient is a difficult historian. He has been somewhat confused for the last week according to his wife. He denies pain. He had some rectal bleeding that was witnessed by one of the earlier nurses. He had a second episode of rectal bleeding that he flushed but left a rim of blood on the stool but his wife cleaned up.    Objective: Filed Vitals:   06/14/15 0200 06/14/15 0452 06/14/15 1055 06/14/15 1406  BP:  156/64 118/47 126/60  Pulse:  53 107 111  Temp:  98.2  F (36.8 C) 98 F (36.7 C) 98 F (36.7 C)  TempSrc:  Oral Oral Oral  Resp:  16 16 16  Height: 6' (1.829 m)     Weight:      SpO2:  98% 99% 97%    Intake/Output Summary (Last 24 hours) at 06/14/15 1409 Last data filed at 06/14/15 1406  Gross per 24 hour  Intake 1113.75 ml  Output   2225 ml  Net -1111.25 ml   Filed Weights   06/13/15 2025  Weight: 81.5 kg (179 lb 10.8 oz)   Body mass index is 24.36  kg/(m^2).  Exam:   General:  Adult male, cachectic around temples, No acute distress  HEENT:  NCAT, mildly dry MM  Cardiovascular:  RRR, nl S1, S2 no mrg, 2+ pulses, warm extremities  Respiratory:  CTAB, no increased WOB  Abdomen:   NABS, soft but distended suggesting ascites, NT  MSK:   Normal tone and bulk, no LEE, dry skin  Neuro:  Grossly intact, somewhat unsteady on his feet when walking  Data Reviewed: Basic Metabolic Panel:  Recent Labs Lab 06/13/15 1116 06/14/15 0250  NA 140 143  K 5.0 4.4  CL 101 106  CO2 15* 15*  GLUCOSE 110* 127*  BUN 160* 151*  CREATININE 14.21* 12.57*  CALCIUM 8.8* 8.3*   Liver Function Tests:  Recent Labs Lab 06/13/15 1116  AST 40  ALT 62  ALKPHOS 203*  BILITOT 1.3*  PROT 8.0  ALBUMIN 2.6*    Recent Labs Lab 06/13/15 1116  LIPASE 54*   No results for input(s): AMMONIA in the last 168 hours. CBC:  Recent Labs Lab 06/13/15 1116 06/14/15 0250  WBC 19.3* 20.4*  HGB 11.7* 11.0*  HCT 34.4* 31.8*  MCV 82.1 81.5  PLT 383 390    No results found for this or any previous visit (from the past 240 hour(s)).   Studies: Dg Chest 2 View  06/13/2015  CLINICAL DATA:  Shortness of breath for 5 days, constipation for 5 days, weakness EXAM: CHEST  2 VIEW COMPARISON:  None FINDINGS: Normal heart size and pulmonary vascularity. Tortuous thoracic aorta. Bronchitic changes with LEFT basilar atelectasis. Remaining lungs clear. No definite pleural effusion or pneumothorax. IMPRESSION: Bronchitic changes with LEFT basilar atelectasis. Electronically Signed   By: Mark  Boles M.D.   On: 06/13/2015 17:31   Us Renal  06/13/2015  CLINICAL DATA:  Renal failure EXAM: RENAL / URINARY TRACT ULTRASOUND COMPLETE COMPARISON:  None. FINDINGS: Right Kidney: Length: 12.6 cm. Normal echogenicity. Mild hydronephrosis. There is a 12 cm renal cyst and a 3 cm renal cyst, both of which appear relatively simple. Left Kidney: Length: 12.7 cm. Mild  hydronephrosis. Normal echogenicity. 14 cm cysts with numerous internal septations and wall thickening. Bladder: Numerous bladder calculi identified. There is debris layering within the bladder and the largest calculus measures about 13 mm. Prostate is enlarged at 5.4 cm. Incidentally detected is a wall echo shadow complex involving the gallbladder indicating cholelithiasis. IMPRESSION: 1. Extensive cholelithiasis 2. Enlarged prostate 3. Bladder calculi 4. Large bilateral renal cysts. On the left, a 14 cm cyst shows a complex appearance and malignancy is not excluded. Further evaluation with renal protocol CT scan or MRI suggested. 5. Mild bilateral hydronephrosis Electronically Signed   By: Raymond  Rubner M.D.   On: 06/13/2015 18:48   Dg Abd 2 Views  06/13/2015  CLINICAL DATA:  Shortness of breath for 5 days, constipation for 5 days, weakness EXAM: ABDOMEN - 2 VIEW COMPARISON:    None FINDINGS: Bronchitic changes with subsegmental atelectasis at LEFT base. Normal bowel gas pattern. No bowel dilatation, bowel wall thickening or free intraperitoneal air. Probable nipple shadows on upright view. Numerous pelvic phleboliths. No acute osseous findings or urinary tract calcification. IMPRESSION: No acute abdominal findings. Electronically Signed   By: Lavonia Dana M.D.   On: 06/13/2015 17:32    Scheduled Meds: . cefTRIAXone (ROCEPHIN)  IV  2 g Intravenous Q24H  . feeding supplement (ENSURE ENLIVE)  237 mL Oral BID BM   Continuous Infusions: .  sodium bicarbonate  infusion 1000 mL 125 mL/hr at 06/14/15 1115    Active Problems:   Renal failure   AKI (acute kidney injury) ([place])   Urinary retention   Fever   Anorexia   Leukocytosis   Elevated blood pressure   Anemia    Time spent: 30 min    Chance Munter, [place] Pager [phone]. If 7PM-7AM, please contact night-coverage at [email or web address], password [facility] 06/14/2015, 2:09 PM  LOS: 1 day

## 2015-06-14 NOTE — Progress Notes (Signed)
Initial Nutrition Assessment  DOCUMENTATION CODES:   Severe malnutrition in context of acute illness/injury  INTERVENTION:   Continue Ensure Enlive po BID, each supplement provides 350 kcal and 20 grams of protein Encourage PO intake RD to continue to monitor  NUTRITION DIAGNOSIS:   Malnutrition related to acute illness as evidenced by moderate depletion of body fat, moderate depletions of muscle mass.  GOAL:   Patient will meet greater than or equal to 90% of their needs  MONITOR:   PO intake, Supplement acceptance, Labs, Weight trends, I & O's  REASON FOR ASSESSMENT:   Consult Assessment of nutrition requirement/status  ASSESSMENT:   82 year old male with no significant known past medical history but has not been to the doctor in many many years who presented with progressive weakness, clouded thinking, difficulty urinating and decreased urine output. He also also had hiccups for the last 5 days prior to admission and poor by mouth intake. He denied vomiting. He had some constipation that resolved with stool softeners and resulted in diarrhea.   Patient in room asleep with daughter at bedside. Per daughter, pt has not been eating well. For lunch today he managed to eat a few bites of greens, a bite of his [place], and a whole container of applesauce. States he will only drink water and juice. He refuses to drink Ensure/Boost drinks. Pt's grandson bought pt Glucerna shakes to try at home and he did not want to drink them. Explained the benefit of drinking nutritional supplements, will continue order at this time but unsure if he will drink them. Pt's daughter states he does not drink dairy. Encouraged pt to try and prioritize his protein foods with meals if he is only eating little amounts.  Pt has been experiencing constipation for a month and poor appetite. Pt woke up during visit but was unable to provide any information. Per daughter, pt has had a BM but it was very bloody 2/13.   Per ED notes, pt's wife reported poor intake for a week PTA.  Nutrition-Focused physical exam completed. Findings are moderate fat depletion, moderate muscle depletion, and no edema.   Labs reviewed: Elevated BUN & Creatinine  Diet Order:  Diet renal with fluid restriction Fluid restriction:: 2000 mL Fluid; Room service appropriate?: Yes; Fluid consistency:: Thin  Skin:  Reviewed, no issues  Last BM:  2/13  Height:   Ht Readings from Last 1 Encounters:  06/14/15 6' (1.829 m)    Weight:   Wt Readings from Last 1 Encounters:  06/13/15 179 lb 10.8 oz (81.5 kg)    Ideal Body Weight:  80.9 kg  BMI:  Body mass index is 24.36 kg/(m^2).  Estimated Nutritional Needs:   Kcal:  2100-2300  Protein:  90-100g  Fluid:  2L/day  EDUCATION NEEDS:   Education needs addressed  Tilda Franco, MS, RD, LDN Pager: [phone] After Hours Pager: [phone]

## 2015-06-14 NOTE — Consult Note (Signed)
[facility] Consult Note Requesting Physician:  Dr. Sheran Fava Reason for Consult:  Renal failure  HPI: The patient is a 82 y.o. year-old man with no significant PMH (wife says has never seen a doctor) who developed a reduced appetite, progressive weakness (starting around Feb 4), reduced urine output with some difficulty urinating, and constipation (of about a months duration) . According to his wife he was taking either motrin or aleve on a daily basis. Only other med was miralax for constipation that started about a month ago. 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  On presentation to the ED he had a creatinine of 14.2, BUN 160. Mild bilateral hydronephrosis with normal kidney sizes and normal echogenicity was noted on ultrasound. Also seen was a lot of debris in the bladder and bladder stones.  Foley was placed and he had an initial output of 2.15 liters but has had little urine since. Creatinine is down to 12.57 (had a liter bolus of NS since admission, currently getting 2/3 isotonic bicarb at 150 per hour. We are asked to see.  Today he has a foley in place with only a few cc of blood tinged urine, and is passing blood from his rectum but no stool. He presently denies CP, SOB, abd pain, suprapubic pain. Says he thinks his appetite is picking up.  Denies any nausea or vomiting.   He had a sister who was on dialysis. He said he would do "whatever it takes" and if that means dialysis he would do it.  Creatinine data are limited to the current admission and are as follows: CREATININE, SER  Date/Time Value Ref Range Status  06/14/2015 02:50 AM 12.57* 0.61 - 1.24 mg/dL Final  06/13/2015 11:16 AM 14.21* 0.61 - 1.24 mg/dL Final    Past Medical History  Diagnosis Date  . Constipation     Past Surgical History: History reviewed. No pertinent past surgical history.  Family History: No family history on  file. Social History:  reports that he has never smoked. He does not have any smokeless tobacco history on file. He reports that he does not drink alcohol or use illicit drugs.Drives a cab. Wife was former CNA at [facility] in the PACU.   Allergies: No Known Allergies  Home medications: Prior to Admission medications   Medication Sig Start Date End Date Taking? Authorizing Provider  Multiple Vitamins-Minerals (MULTIVITAMIN ADULT PO) Take 1 tablet by mouth daily.   Yes Historical Provider, MD  naproxen sodium (ANAPROX) 220 MG tablet Take 400 mg by mouth every 12 (twelve) hours as needed (PAIN).   Yes Historical Provider, MD    Inpatient medications: . cefTRIAXone (ROCEPHIN)  IV  2 g Intravenous Q24H  . feeding supplement (ENSURE ENLIVE)  237 mL Oral BID BM    Review of Systems Gen:  Denies headache, fever, chills, sweats.  No weight loss. HEENT:  No visual change, sore throat, difficulty swallowing. Resp:  No difficulty breathing, DOE.  No cough or hemoptysis. Cardiac:  No chest pain, orthopnea, PND.  Denies edema. GI:   Denies abdominal pain.   No nausea, vomiting, + constipation for a month with some blood when trying to have BM GU:  Denies difficulty or change in voiding until past few days MS:  Denies joint pain or swelling.   Derm:  Denies skin rash or itching.  No chronic skin conditions.  Neuro:   Denies  focal weakness, memory problems, hx stroke or TIA. (though wife says "mentally not himself" past week or so   Physical Exam:  BP 118/47 mmHg  Pulse 107  Temp(Src) 98 F (36.7 C) (Oral)  Resp 16  Ht 6' (1.829 m)  Wt 81.5 kg (179 lb 10.8 oz)  BMI 24.36 kg/m2  SpO2 99% Gen: Tall slender [name] NAD Skin: no rash, cyanosis Neck: No JVD or carotid bruits Chest: Clear lung fields Heart: Regular tachy around 100 S1S2 Normal. No S3 Abdomen: soft, no focal tenderness. No masses felt. Ext: No edema whatsoever Neuro: alert, Ox3, no asterixus Foley in place - a few cc of blood tinged  urine and some sediment   Labs: Basic Metabolic Panel:  Recent Labs Lab 06/13/15 1116 06/14/15 0250  NA 140 143  K 5.0 4.4  CL 101 106  CO2 15* 15*  GLUCOSE 110* 127*  BUN 160* 151*  CREATININE 14.21* 12.57*  CALCIUM 8.8* 8.3*     Recent Labs Lab 06/13/15 1116  AST 40  ALT 62  ALKPHOS 203*  BILITOT 1.3*  PROT 8.0  ALBUMIN 2.6*    Recent Labs Lab 06/13/15 1116  LIPASE 54*   CBC:  Recent Labs Lab 06/13/15 1116 06/14/15 0250  WBC 19.3* 20.4*  HGB 11.7* 11.0*  HCT 34.4* 31.8*  MCV 82.1 81.5  PLT 383 390    Iron Studies:  Recent Labs Lab 06/14/15 0250  IRON 47  TIBC 126*  FERRITIN 987*   Results for ABE, SCHOOLS (MRN [id number]) as of 06/14/2015 11:59  Ref. Range 06/13/2015 19:19  Appearance Latest Ref Range: CLEAR  TURBID (A)  Bacteria, UA Latest Ref Range: NONE SEEN  FEW (A)  Bilirubin Urine Latest Ref Range: NEGATIVE  MODERATE (A)  Color, Urine Latest Ref Range: YELLOW  RED (A)  Glucose Latest Ref Range: NEGATIVE mg/dL NEGATIVE  Hgb urine dipstick Latest Ref Range: NEGATIVE  LARGE (A)  Ketones, ur Latest Ref Range: NEGATIVE mg/dL 15 (A)  Leukocytes, UA Latest Ref Range: NEGATIVE  LARGE (A)  Nitrite Latest Ref Range: NEGATIVE  POSITIVE (A)  pH Latest Ref Range: 5.0-8.0  7.0  Protein Latest Ref Range: NEGATIVE mg/dL 100 (A)  RBC / HPF Latest Ref Range: 0-5 RBC/hpf TOO NUMEROUS TO C...  Specific Gravity, Urine Latest Ref Range: 1.005-1.030  1.012  Squamous Epithelial / LPF Latest Ref Range: NONE SEEN  NONE SEEN  Urine-Other Unknown MICROSCOPIC EXAM ...  WBC, UA Latest Ref Range: 0-5 WBC/hpf TOO NUMEROUS TO C...    Xrays/Other Studies: Dg Chest 2 View  06/13/2015  CLINICAL DATA:  Shortness of breath for 5 days, constipation for 5 days, weakness EXAM: CHEST  2 VIEW COMPARISON:  None FINDINGS: Normal heart size and pulmonary vascularity. Tortuous thoracic aorta. Bronchitic changes with LEFT basilar atelectasis. Remaining lungs clear. No definite  pleural effusion or pneumothorax. IMPRESSION: Bronchitic changes with LEFT basilar atelectasis. Electronically Signed   By: Lavonia Dana M.D.   On: 06/13/2015 17:31   US Renal  06/13/2015  CLINICAL DATA:  Renal failure EXAM: RENAL / URINARY TRACT ULTRASOUND COMPLETE COMPARISON:  None. FINDINGS: Right Kidney: Length: 12.6 cm. Normal echogenicity. Mild hydronephrosis. There is a 12 cm renal cyst and a 3 cm renal cyst, both of which appear relatively simple. Left Kidney: Length: 12.7 cm. Mild hydronephrosis. Normal echogenicity. 14 cm cysts with numerous internal septations and wall thickening. Bladder: Numerous bladder calculi identified. There is debris layering within the bladder and the largest calculus measures about 13  mm. Prostate is enlarged at 5.4 cm. Incidentally detected is a wall echo shadow complex involving the gallbladder indicating cholelithiasis. IMPRESSION: 1. Extensive cholelithiasis 2. Enlarged prostate 3. Bladder calculi 4. Large bilateral renal cysts. On the left, a 14 cm cyst shows a complex appearance and malignancy is not excluded. Further evaluation with renal protocol CT scan or MRI suggested. 5. Mild bilateral hydronephrosis Electronically Signed   By: Skipper Cliche M.D.   On: 06/13/2015 18:48   Dg Abd 2 Views  06/13/2015  CLINICAL DATA:  Shortness of breath for 5 days, constipation for 5 days, weakness EXAM: ABDOMEN - 2 VIEW COMPARISON:  None FINDINGS: Bronchitic changes with subsegmental atelectasis at LEFT base. Normal bowel gas pattern. No bowel dilatation, bowel wall thickening or free intraperitoneal air. Probable nipple shadows on upright view. Numerous pelvic phleboliths. No acute osseous findings or urinary tract calcification. IMPRESSION: No acute abdominal findings. Electronically Signed   By: Lavonia Dana M.D.   On: 06/13/2015 17:32    Background 82 y.o. year-old man with no significant PMH who developed a reduced appetite, progressive weakness, reduced urin output with  some difficulty urinating, and constipation. He was taking NSAIDS's at home PTA. On presentation to the ED he had a creatinine of 14.2, BUN 160. Mild bilateral hydronephrosis with normal kidney sizes and normal echogenicity was noted on ultrasound. Also seen was a lot of debris in the bladder and bladder stones.  Foley was placed and he had an initial output of 2.15 liters but has had little urine since. UA shows WBC's, RBC's, protein.  Creatinine is down to 12.57 today  (had a liter bolus of NS since admission, currently getting 2/3 isotonic bicarb at 150 per hour). We are asked to see.  Impression/Plan  1. Renal failure with bilateral hydronephrosis on [place] - initially with 2L urine in the bladder, minimal UOP since. Creatinine down some but may just be dilution from IVF. Urine with WBC, RBC, protein but looks infected. Hydro on [place], NSAIDS PTA - in the best of all worlds this would be obstruction with NSAIDS and could possibly get better. 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  I think we need to go ahead and throw out a wider net re diagnosis (kidneys are still normal size, normal echogenicity) Will go ahead and send SPEP, UPEP, GN serologies to have those cooking. 2. Metabolic acidosis with gap of 22 - 2/3 isotonic bicarb IVF 3. Leukocytosis - possible UTI. On rocephin 4. Mild anemia - ? CKD. Fe stores adequate. No ESA requirement. 5. Bladder sludge/bladder stones - irrigate foley QShift (to make sure lack of urine output isn't related to bladder sludge occluding foley) 6. Large complex renal cyst - cannot exclude malignancy. Not the cause of his RF but will need eval at some point.  7. Constipation with no stool/+ blood on toilet seat - ? Hemorrhoids. Per primary service. Has never had a colonoscopy.  The constipation is new and fairly severe 8. Elevated alk phos and bili. Gallstones on [place]. Recheck lab AM   Thanks for the consult and will follow with you. Wife and daughter as well as pt updated on the situation, questions answered (I actually know his wife from her past days working in PACU at [facility].)    Caren Griffins  Lorrene Reid,  MD [facility] [phone] pager 06/14/2015, 12:00 PM

## 2015-06-15 ENCOUNTER — Encounter (HOSPITAL_COMMUNITY): Payer: Self-pay | Admitting: *Deleted

## 2015-06-15 DIAGNOSIS — N390 Urinary tract infection, site not specified: Secondary | ICD-10-CM | POA: Diagnosis present

## 2015-06-15 DIAGNOSIS — A419 Sepsis, unspecified organism: Principal | ICD-10-CM | POA: Diagnosis present

## 2015-06-15 DIAGNOSIS — E0590 Thyrotoxicosis, unspecified without thyrotoxic crisis or storm: Secondary | ICD-10-CM | POA: Diagnosis present

## 2015-06-15 DIAGNOSIS — K625 Hemorrhage of anus and rectum: Secondary | ICD-10-CM

## 2015-06-15 DIAGNOSIS — D638 Anemia in other chronic diseases classified elsewhere: Secondary | ICD-10-CM

## 2015-06-15 LAB — T4, FREE: FREE T4: 1.06 ng/dL (ref 0.61–1.12)

## 2015-06-15 LAB — COMPREHENSIVE METABOLIC PANEL
ALT: 43 U/L (ref 17–63)
AST: 29 U/L (ref 15–41)
Albumin: 2 g/dL — ABNORMAL LOW (ref 3.5–5.0)
Alkaline Phosphatase: 133 U/L — ABNORMAL HIGH (ref 38–126)
Anion gap: 20 — ABNORMAL HIGH (ref 5–15)
BILIRUBIN TOTAL: 0.9 mg/dL (ref 0.3–1.2)
BUN: 162 mg/dL — AB (ref 6–20)
CO2: 16 mmol/L — ABNORMAL LOW (ref 22–32)
CREATININE: 12.5 mg/dL — AB (ref 0.61–1.24)
Calcium: 7.8 mg/dL — ABNORMAL LOW (ref 8.9–10.3)
Chloride: 104 mmol/L (ref 101–111)
GFR calc Af Amer: 4 mL/min — ABNORMAL LOW (ref >60)
GFR, EST NON AFRICAN AMERICAN: 3 mL/min — AB (ref >60)
Glucose, Bld: 171 mg/dL — ABNORMAL HIGH (ref 65–99)
Potassium: 4 mmol/L (ref 3.5–5.1)
Sodium: 140 mmol/L (ref 135–145)
TOTAL PROTEIN: 6.6 g/dL (ref 6.5–8.1)

## 2015-06-15 LAB — PHOSPHORUS: Phosphorus: 5.8 mg/dL — ABNORMAL HIGH (ref 2.5–4.6)

## 2015-06-15 LAB — CBC
HCT: 28.6 % — ABNORMAL LOW (ref 39.0–52.0)
Hemoglobin: 10.1 g/dL — ABNORMAL LOW (ref 13.0–17.0)
MCH: 28.6 pg (ref 26.0–34.0)
MCHC: 35.3 g/dL (ref 30.0–36.0)
MCV: 81 fL (ref 78.0–100.0)
Platelets: 371 10*3/uL (ref 150–400)
RBC: 3.53 MIL/uL — ABNORMAL LOW (ref 4.22–5.81)
RDW: 13.8 % (ref 11.5–15.5)
WBC: 22.3 10*3/uL — AB (ref 4.0–10.5)

## 2015-06-15 LAB — TSH: TSH: 0.127 u[IU]/mL — AB (ref 0.350–4.500)

## 2015-06-15 LAB — URINE CULTURE: CULTURE: NO GROWTH

## 2015-06-15 LAB — LACTIC ACID, PLASMA: Lactic Acid, Venous: 1.6 mmol/L (ref 0.5–2.0)

## 2015-06-15 LAB — LIPASE, BLOOD: LIPASE: 49 U/L (ref 11–51)

## 2015-06-15 MED ORDER — CALCIUM ACETATE (PHOS BINDER) 667 MG PO CAPS
667.0000 mg | ORAL_CAPSULE | Freq: Three times a day (TID) | ORAL | Status: DC
  Administered 2015-06-17 – 2015-06-28 (×13): 667 mg via ORAL
  Filled 2015-06-15 (×20): qty 1

## 2015-06-15 NOTE — Progress Notes (Signed)
[facility] Progress Note  Patient: Jeff Parks ZOX:[id number]   PCP: No primary care provider on file. DOB: 17-Jun-1929   DOA: 06/13/2015   DOS: 06/15/2015   Date of Service: the patient was seen and examined on 06/15/2015  Subjective: Patient started complaining of hiccups, also has been breathing heavily. It has some pain in rectum with bowel movement also had one episode of bleeding. Denies any nausea or vomiting. No chest pain. Nutrition: Tolerating oral diet with minimal oral intake Activity: Ambulating in the room Last BM: 06/15/2015  Assessment and Plan: 1. AKI (acute kidney injury) [facility]) Patient presents with complaints of generalized fatigue and weakness and anorexia. Workup shows that he has developed acute kidney injury. No prior lab work to compare renal function. These can represent a possible acute on chronic kidney damage, but cannot verify baseline renal function. Given the presentation on admission and development of hyperventilation, hiccups, dizziness the patient is developing evidence of uremia and will need hemodialysis. Patient will be transferred to [facility]. Nephrology will consult IR for HD catheter placement. Further workup is currently pending.  2. Sepsis due to UTI, urinary retention with bladder stone. Suspected obstructive uropathy. Appreciate input from urology. Patient has mild hydronephrosis and will need follow-up renal ultrasound to ensure resolution. Patient also has minimal hematuria but no overt evidence of blood loss. Continue Foley catheter will need CBI if does not improve. As per urology recommendation patient will need TURP as well as cystoscopy for bladder stone as well as BPH in future once he is clinically more stable. Currently continue ceftriaxone. Follow cultures. Lactic acid level normal.  3. Bright red blood per rectum. No active evidence of significant bleeding. Will check H&H every 12 hours.  4. Suspected  hyperthyroidism. TSH is suppressed we will await free T4. Holding off on treatment at present.  5. Anemia of chronic disease. Hemoglobin currently remains stable. Anemia panel shows normal reticulocyte count which should be elevated if the patient having acute bleeding. Normal iron level as well as saturation, elevated ferritin and B-12 level. Replace folic acid level.  DVT Prophylaxis: mechanical compression device. Nutrition: Renal diet Advance goals of care discussion: Full code  Brief Summary of Hospitalization:  HPI: As per the H and P dictated on admission, "Jeff Parks is a 82 y.o. male with no previous medical problems who presents to the ED with complaints of Urinary retention x 3- 4 days. He reports not being able to urinate for the past 12 hours. He began to have fever today as well. He was evaluated in the ED, and a foley catheter was placed in the ED, and his BUN/Cr was elevated, and a Renal US was ordered. He was referred for admission. " Daily update, Procedures: Transferred to [facility] 06/15/2015 Consultants: Nephrology, urology Antibiotics: Anti-infectives    Start     Dose/Rate Route Frequency Ordered Stop   06/14/15 1800  cefTRIAXone (ROCEPHIN) 2 g in dextrose 5 % 50 mL IVPB     2 g 100 mL/hr over 30 Minutes Intravenous Every 24 hours 06/13/15 1828     06/13/15 1815  cefTRIAXone (ROCEPHIN) 2 g in dextrose 5 % 50 mL IVPB     2 g 100 mL/hr over 30 Minutes Intravenous  Once 06/13/15 1805 06/13/15 1955      Family Communication: family was present at bedside, at the time of interview.  Opportunity was given to ask question and all questions were answered satisfactorily.   Disposition:  Barriers to safe  discharge: Renal function improvement   Intake/Output Summary (Last 24 hours) at 06/15/15 1741 Last data filed at 06/15/15 1600  Gross per 24 hour  Intake   2430 ml  Output    975 ml  Net   1455 ml   Filed Weights   06/13/15 2025  Weight: 81.5 kg  (179 lb 10.8 oz)    Objective: Physical Exam: Filed Vitals:   06/15/15 0114 06/15/15 0615 06/15/15 1000 06/15/15 1400  BP: 162/63 124/68 133/64 137/51  Pulse: 109 110 111 108  Temp: 97.7 F (36.5 C) 99.1 F (37.3 C) 98.9 F (37.2 C) 98.7 F (37.1 C)  TempSrc: Oral Oral Oral Axillary  Resp: Height:      Weight:      SpO2: 99% 100% 98% 97%     General: Appear in moderate distress, no Rash; Oral Mucosa moist. Cardiovascular: S1 and S2 Present, no Murmur, no JVD Respiratory: Bilateral Air entry present and Clear to Auscultation, no Crackles, no wheezes Abdomen: Bowel Sound present, Soft and no tenderness Extremities: no Pedal edema, no calf tenderness Neurology: Grossly no focal neuro deficit.  Data Reviewed: CBC:  Recent Labs Lab 06/13/15 1116 06/14/15 0250 06/15/15 0437  WBC 19.3* 20.4* 22.3*  HGB 11.7* 11.0* 10.1*  HCT 34.4* 31.8* 28.6*  MCV 82.1 81.5 81.0  PLT 383 390 371   Basic Metabolic Panel:  Recent Labs Lab 06/13/15 1116 06/14/15 0250 06/15/15 0437  NA 140 143 140  K 5.0 4.4 4.0  CL 101 106 104  CO2 15* 15* 16*  GLUCOSE 110* 127* 171*  BUN 160* 151* 162*  CREATININE 14.21* 12.57* 12.50*  CALCIUM 8.8* 8.3* 7.8*  PHOS  --   --  5.8*   Liver Function Tests:  Recent Labs Lab 06/13/15 1116 06/15/15 0437  AST 40 29  ALT 62 43  ALKPHOS 203* 133*  BILITOT 1.3* 0.9  PROT 8.0 6.6  ALBUMIN 2.6* 2.0*    Recent Labs Lab 06/13/15 1116 06/15/15 1152  LIPASE 54* 49   No results for input(s): AMMONIA in the last 168 hours.  Cardiac Enzymes: No results for input(s): CKTOTAL, CKMB, CKMBINDEX, TROPONINI in the last 168 hours.  BNP (last 3 results) No results for input(s): BNP in the last 8760 hours.  CBG: No results for input(s): GLUCAP in the last 168 hours.  Recent Results (from the past 240 hour(s))  Culture, blood (x 2)     Status: None (Preliminary result)   Collection Time: 06/13/15 11:03 PM  Result Value Ref Range  Status   Specimen Description BLOOD LEFT ARM  Final   Special Requests BOTTLES DRAWN AEROBIC AND ANAEROBIC 5 CC  Final   Culture   Final    NO GROWTH 1 DAY Performed at [facility]    Report Status PENDING  Incomplete  Culture, blood (x 2)     Status: None (Preliminary result)   Collection Time: 06/13/15 11:04 PM  Result Value Ref Range Status   Specimen Description BLOOD RIGHT HAND  Final   Special Requests BOTTLES DRAWN AEROBIC AND ANAEROBIC 5 CC  Final   Culture   Final    NO GROWTH 1 DAY Performed at [facility]    Report Status PENDING  Incomplete  Culture, Urine     Status: None   Collection Time: 06/14/15  7:41 AM  Result Value Ref Range Status   Specimen Description URINE, RANDOM  Final   Special Requests NONE  Final  Culture   Final    NO GROWTH 1 DAY Performed at [facility]    Report Status 06/15/2015 FINAL  Final     Studies: No results found.   Scheduled Meds: . calcium acetate  667 mg Oral TID WC  . cefTRIAXone (ROCEPHIN)  IV  2 g Intravenous Q24H  . docusate sodium  100 mg Oral BID  . feeding supplement (ENSURE ENLIVE)  237 mL Oral BID BM   Continuous Infusions: .  sodium bicarbonate  infusion 1000 mL 125 mL/hr at 06/15/15 0224   PRN Meds: acetaminophen **OR** acetaminophen, alum & mag hydroxide-simeth, ondansetron **OR** ondansetron (ZOFRAN) IV, oxyCODONE, polyethylene glycol  Time spent: 30 minutes  Author: Lynden Oxford, MD [facility] Pager: [phone] 06/15/2015 5:41 PM  If 7PM-7AM, please contact night-coverage at [email or web address], password [facility]

## 2015-06-15 NOTE — Evaluation (Signed)
Physical Therapy Evaluation Patient Details Name: Jeff Parks MRN: [id number] DOB: [birth year] Today's Date: 06/15/2015   History of Present Illness  Jeff Parks is a 82 y.o. male with no previous medical problems who presents to the ED with complaints of Urinary retention x 3- 4 days. He reports not being able to urinate for the past 12 hours. He began to have fever today as well. He was evaluated in the ED, and a foley catheter was placed in the ED, and his BUN/Cr was elevated, and a Renal US was ordered and was admitted. Dx: Renal failure, urinary retention, Fever, anorexia, anemia, leukocytosis - due to early sepsis, HTN.  Clinical Impression  Pt admitted with above diagnosis. Pt currently with functional limitations due to the deficits listed below (see PT Problem List). Max assist for sit to stand, pt ambulated 50' with RW and min A. May need ST-SNF, depending on progress, wife is hopeful that pt can DC home. She is currently declining SNF.  Pt will benefit from skilled PT to increase their independence and safety with mobility to allow discharge to the venue listed below.       Follow Up Recommendations SNF;Home health PT (pt's wife wants to take pt home, wants to wait to see if he still requires physical assist closer to DC to decide on SNF)    Equipment Recommendations  Rolling walker with 5" wheels    Recommendations for Other Services OT consult     Precautions / Restrictions Precautions Precautions: Fall Restrictions Weight Bearing Restrictions: No      Mobility  Bed Mobility Overal bed mobility: Needs Assistance;+2 for physical assistance Bed Mobility: Sit to Supine   Sidelying to sit: Max assist;+2 for physical assistance;HOB elevated Supine to sit: Max assist;+2 for physical assistance;HOB elevated Sit to supine: Min assist   General bed mobility comments: min A for BLEs into bed  Transfers Overall transfer level: Needs assistance Equipment used: Rolling  walker (2 wheeled) Transfers: Sit to/from Stand Sit to Stand: Max assist Stand pivot transfers: Max assist       General transfer comment: max A to rise from recliner, Verbal cues for hand placment, pt 50%  Ambulation/Gait Ambulation/Gait assistance: Min assist Ambulation Distance (Feet): 50 Feet Assistive device: Rolling walker (2 wheeled) Gait Pattern/deviations: Step-through pattern;Decreased stride length;Trunk flexed   Gait velocity interpretation: Below normal speed for age/gender General Gait Details: min A to maneuver RW with turns, Verbal cues for positioning in RW and to maintain contact with RW when backing up to bed, no LOB  Stairs            Wheelchair Mobility    Modified Rankin (Stroke Patients Only)       Balance Overall balance assessment: Needs assistance Sitting-balance support: Feet supported Sitting balance-Leahy Scale: Fair Sitting balance - Comments: HOB elevated significantly and +2 assist for initial static sitting at EOB (this may be medication related per family/spouse report) Postural control: Posterior lean;Right lateral lean Standing balance support: Bilateral upper extremity supported Standing balance-Leahy Scale: Fair Standing balance comment: Static standing with RW was initially +2 and decreased to +1 Mod A                             Pertinent Vitals/Pain Pain Assessment: No/denies pain    Home Living Family/patient expects to be discharged to:: Private residence Living Arrangements: Spouse/significant other Available Help at Discharge: Family;Available PRN/intermittently Type of Home: House Home Access: Stairs  to enter Entrance Stairs-Rails: Can reach both Entrance Stairs-Number of Steps: 4  STE Home Layout: One level Home Equipment: None      Prior Function Level of Independence: Independent         Comments: no falls in past year per wife, pt did yard work      Higher education careers adviser   Dominant Hand:  Left    Extremity/Trunk Assessment   Upper Extremity Assessment: Overall WFL for tasks assessed           Lower Extremity Assessment: Generalized weakness (knee extension 5/5 LLE, 4/5 RLE; sensation intact to light touch)      Cervical / Trunk Assessment: Normal  Communication   Communication: No difficulties  Cognition Arousal/Alertness: Lethargic;Suspect due to medications   Overall Cognitive Status: Impaired/Different from baseline Area of Impairment: Orientation;Attention;Following commands;Safety/judgement;Awareness;Problem solving Orientation Level: Disoriented to;Time;Situation     Following Commands: Follows one step commands inconsistently Safety/Judgement: Decreased awareness of safety   Problem Solving: Slow processing;Decreased initiation;Difficulty sequencing;Requires verbal cues;Requires tactile cues      General Comments      Exercises        Assessment/Plan    PT Assessment Patient needs continued PT services  PT Diagnosis Difficulty walking;Generalized weakness;Altered mental status   PT Problem List Decreased strength;Decreased activity tolerance;Decreased balance;Decreased knowledge of use of DME;Decreased mobility  PT Treatment Interventions DME instruction;Gait training;Stair training;Functional mobility training;Therapeutic activities;Patient/family education;Balance training;Therapeutic exercise   PT Goals (Current goals can be found in the Care Plan section) Acute Rehab PT Goals Patient Stated Goal: "to do whatever I want", wife wants to bring pt home PT Goal Formulation: With family Time For Goal Achievement: 06/29/15 Potential to Achieve Goals: Good    Frequency Min 3X/week   Barriers to discharge        Co-evaluation               End of Session Equipment Utilized During Treatment: Gait belt Activity Tolerance: Patient tolerated treatment well;Patient limited by fatigue Patient left: in bed;with call bell/phone within  reach;with bed alarm set           Time: 1610-9604 PT Time Calculation (min) (ACUTE ONLY): 23 min   Charges:   PT Evaluation $PT Eval Low Complexity: 1 Procedure PT Treatments $Gait Training: 8-22 mins   PT G Codes:        Tamala Ser 06/15/2015, 11:52 AM [phone]

## 2015-06-15 NOTE — Clinical Documentation Improvement (Signed)
Hospitalist  Can the diagnosis of sepsis be ruled in or out?   In some notes sepsis is documented, other notes say early sepsis and other notes do not list sepsis.   Sepsis from UTI ruled in  Sepsis ruled out  Other  Clinically Undetermined   Supporting Information: Progress note on 2/14 by Dr. Malachi Bonds. -  Sepsis with fever, leukocytosis, tachycardia likely secondary to probable urinary tract infection, present at the time of admission  - Continue ceftriaxone  - Follow-up urine culture  - Chest x-ray without acute infiltrate    Please exercise your independent, professional judgment when responding. A specific answer is not anticipated or expected.   Thank Modesta Messing [facility] Health Information Management [place] [phone]

## 2015-06-15 NOTE — Evaluation (Signed)
Occupational Therapy Evaluation Patient Details Name: Jeff Parks MRN: [id number] DOB: [birth year] Today's Date: 06/15/2015    History of Present Illness Herb Beltre is a 82 y.o. male with no previous medical problems who presents to the ED with complaints of Urinary retention x 3- 4 days. He reports not being able to urinate for the past 12 hours. He began to have fever today as well. He was evaluated in the ED, and a foley catheter was placed in the ED, and his BUN/Cr was elevated, and a Renal US was ordered and was admitted. Dx: Renal failure, urinary retention, Fever, anorexia, anemia, leukocytosis - due to early sepsis, HTN.   Clinical Impression   Pt was admitted as above and currently demonstrates deficits (see OT problem list below) in his ability to perform functional mobility/transfers related to ADL's and self care activities. He required fluctuating levels of assist and this may have been due to medications. Recommended SNF Rehab, however family plans for d/c home w/ PRN A. Will follow acutely for OT.    Follow Up Recommendations  Home health OT;Supervision/Assistance - 24 hour (Rec SNF Rehab, pt currently plans to go home w/ PRN A, cont to monitor)    Equipment Recommendations  3 in 1 bedside comode;Tub/shower bench (Cont to assess as current status may be medication related.)    Recommendations for Other Services       Precautions / Restrictions Precautions Precautions: Fall Restrictions Weight Bearing Restrictions: No      Mobility Bed Mobility Overal bed mobility: Needs Assistance;+2 for physical assistance Bed Mobility: Sidelying to Sit;Supine to Sit   Sidelying to sit: Max assist;+2 for physical assistance;HOB elevated Supine to sit: Max assist;+2 for physical assistance;HOB elevated     General bed mobility comments: Pt required +2 max A for all bed mobility, was given multimodal cues throughout including raising HOB significantly as pt had difficulty  following 1 step commands.  Transfers Overall transfer level: Needs assistance Equipment used: Rolling walker (2 wheeled) Transfers: Sit to/from [employer] Sit to Stand: Max assist;+2 physical assistance Stand pivot transfers: Max assist       General transfer comment: Initial sit to stand was Max +2, then pt became less dependent in standing and decreased to Mod A by end of session    Balance Overall balance assessment: Needs assistance Sitting-balance support: Bilateral upper extremity supported;Feet supported Sitting balance-Leahy Scale: Poor Sitting balance - Comments: HOB elevated significantly and +2 assist for initial static sitting at EOB (this may be medication related per family/spouse report) Postural control: Posterior lean;Right lateral lean Standing balance support: Bilateral upper extremity supported Standing balance-Leahy Scale: Fair Standing balance comment: Static standing with RW was initially +2 and decreased to +1 Mod A                            ADL Overall ADL's : Needs assistance/impaired     Grooming: Set up;Minimal assistance;Sitting   Upper Body Bathing: Set up;Minimal assitance;Sitting   Lower Body Bathing: Set up;Sitting/lateral leans;Sit to/from stand;Cueing for safety;Cueing for sequencing;Maximal assistance   Upper Body Dressing : Set up;Minimal assistance;Sitting   Lower Body Dressing: Maximal assistance;Sitting/lateral leans;Sit to/from stand;Cueing for safety   Toilet Transfer: Maximal assistance;Stand-pivot;BSC;RW;Cueing for safety;Cueing for sequencing (Initially Max Assist then improved to Mod A during assessment for simuklated toilet transfer from EOB to chair)   Toileting- Clothing Manipulation and Hygiene: Total assistance;+2 for physical assistance;+2 for safety/equipment;Sit to/from stand  Functional mobility during ADLs: Moderate assistance;Rolling walker;+2 for physical assistance;+2 for  safety/equipment General ADL Comments: Pt/Family were educated in role of OT.  Pt is very lethargic, fatigues easily during OT assessment. Not following 1 step commands consistantly - may be medication related. Pt was Max x2 for safety then at times would improve to Mod A, but this was inconsistent as well. Family present throughout session. Recommended SNF Rehab, but family plans for home with PRN Assist at d/c as his spouse believes that once medication is out of his system, he will be more independent.  Pt sat EOB w/ +1-2 Assist ~27min with fluctuating levels of assist required. Pt stood statically w/ RW again w/ fluctuating level of assist improving to Mod A +1 and transferred to chair with Mod -max A +1.     Vision  Wears glasses for reading only. Pt states no change from baseline however frequently opens and closes eyes as if trying to focus.   Perception     Praxis      Pertinent Vitals/Pain Pain Assessment: No/denies pain     Hand Dominance Left   Extremity/Trunk Assessment Upper Extremity Assessment Upper Extremity Assessment: Overall WFL for tasks assessed;Generalized weakness   Lower Extremity Assessment Lower Extremity Assessment: Defer to PT evaluation       Communication Communication Communication: No difficulties   Cognition Arousal/Alertness: Lethargic;Suspect due to medications   Overall Cognitive Status: Impaired/Different from baseline Area of Impairment: Orientation;Attention;Following commands;Safety/judgement;Awareness;Problem solving Orientation Level: Disoriented to;Time;Situation     Following Commands: Follows one step commands inconsistently Safety/Judgement: Decreased awareness of safety   Problem Solving: Slow processing;Decreased initiation;Difficulty sequencing;Requires verbal cues;Requires tactile cues     General Comments       Exercises       Shoulder Instructions      Home Living Family/patient expects to be discharged to:: Private  residence Living Arrangements: Spouse/significant other Available Help at Discharge: Family;Available PRN/intermittently Type of Home: House Home Access: Stairs to enter [employer] of Steps: 4  STE Entrance Stairs-Rails: Can reach both Home Layout: One level     Bathroom Shower/Tub: Tub/shower unit Shower/tub characteristics: Engineer, building services: Standard     Home Equipment: None          Prior Functioning/Environment Level of Independence: Independent             OT Diagnosis: Generalized weakness;Altered mental status   OT Problem List: Decreased activity tolerance;Impaired balance (sitting and/or standing);Decreased knowledge of use of DME or AE;Decreased safety awareness;Decreased strength   OT Treatment/Interventions: Self-care/ADL training;DME and/or AE instruction;Patient/family education;Cognitive remediation/compensation;Therapeutic activities;Balance training    OT Goals(Current goals can be found in the care plan section) Acute Rehab OT Goals Patient Stated Goal: Pt unalbe. Family states "Go home" w/ PRN Assist OT Goal Formulation: Patient unable to participate in goal setting Time For Goal Achievement: 06/22/15 Potential to Achieve Goals: Good ADL Goals Pt Will Perform Grooming: with modified independence;sitting;standing Pt Will Perform Lower Body Bathing: with supervision;with caregiver independent in assisting;sit to/from stand;sitting/lateral leans Pt Will Perform Lower Body Dressing: with supervision;with caregiver independent in assisting;sitting/lateral leans;sit to/from stand Pt Will Transfer to Toilet: with supervision;ambulating;bedside commode;stand pivot transfer Pt Will Perform Toileting - Clothing Manipulation and hygiene: with supervision;with caregiver independent in assisting;sitting/lateral leans;sit to/from stand Pt Will Perform Tub/Shower Transfer: with min assist;with caregiver independent in assisting;ambulating;tub  bench;rolling walker  OT Frequency: Min 2X/week   Barriers to D/C:            Co-evaluation  End of Session Equipment Utilized During Treatment: Gait belt;Rolling walker Nurse Communication: Mobility status  Activity Tolerance: Patient tolerated treatment well;Patient limited by lethargy;Patient limited by fatigue Patient left: in chair;with call bell/phone within reach;with chair alarm set;with family/visitor present   Time: 1610-9604 OT Time Calculation (min): 33 min Charges:  OT General Charges $OT Visit: 1 Procedure OT Evaluation $OT Eval Moderate Complexity: 1 Procedure OT Treatments $Therapeutic Activity: 8-22 mins G-Codes:    Alm Bustard, OTR/L 06/15/2015, 8:51 AM

## 2015-06-15 NOTE — Progress Notes (Signed)
[facility] Rounding Note  Subjective:  No appetite, has developed hiccups Some increase in UOP but no improvement in kidney function  IV has fallen out - needs restart Denies CP, SOB Complains of rectal pain and still has blood in stool  Objective Vital signs in last 24 hours: Filed Vitals:   06/15/15 0114 06/15/15 0615 06/15/15 1000 06/15/15 1400  BP: 162/63 124/68 133/64 137/51  Pulse: 109 110 111 108  Temp: 97.7 F (36.5 C) 99.1 F (37.3 C) 98.9 F (37.2 C) 98.7 F (37.1 C)  TempSrc: Oral Oral Oral Axillary  Resp: '18 19 18 18  ' Height:      Weight:      SpO2: 99% 100% 98% 97%   Weight change:   Intake/Output Summary (Last 24 hours) at 06/15/15 1524 Last data filed at 06/15/15 1400  Gross per 24 hour  Intake   1680 ml  Output    975 ml  Net    705 ml   Physical Exam:  BP 137/51 mmHg  Pulse 108  Temp(Src) 98.7 F (37.1 C) (Axillary)  Resp 18  Ht 6' (1.829 m)  Wt 81.5 kg (179 lb 10.8 oz)  BMI 24.36 kg/m2  SpO2 97%  Gen: Tall slender [name] NAD Skin: no rash, cyanosis Neck: No JVD or carotid bruits Chest: Clear lung fields Heart: Regular  S1S2 Normal. No S3 Abdomen: soft, no focal tenderness. No masses felt. Ext: No edema whatsoever Neuro: alert, Ox3, no asterixus but + hiccups Foley in place - a few cc of blood tinged urine    Labs: Basic Metabolic Panel:  Recent Labs Lab 06/13/15 1116 06/14/15 0250 06/15/15 0437  NA 140 143 140  K 5.0 4.4 4.0  CL 101 106 104  CO2 15* 15* 16*  GLUCOSE 110* 127* 171*  BUN 160* 151* 162*  CREATININE 14.21* 12.57* 12.50*  CALCIUM 8.8* 8.3* 7.8*  PHOS  --   --  5.8*   Liver Function Tests:  Recent Labs Lab 06/13/15 1116 06/15/15 0437  AST 40 29  ALT 62 43  ALKPHOS 203* 133*  BILITOT 1.3* 0.9  PROT 8.0 6.6  ALBUMIN 2.6* 2.0*    Recent Labs Lab 06/13/15 1116 06/15/15 1152  LIPASE 54* 49   CBC:  Recent Labs Lab 06/13/15 1116 06/14/15 0250 06/15/15 0437  WBC 19.3* 20.4* 22.3*  HGB 11.7* 11.0* 10.1*   HCT 34.4* 31.8* 28.6*  MCV 82.1 81.5 81.0  PLT 383 390 371   Results for MENELIK, MCFARREN (MRN [id number]) as of 06/14/2015 11:59  Ref. Range 06/13/2015 19:19  Appearance Latest Ref Range: CLEAR  TURBID (A)  Bacteria, UA Latest Ref Range: NONE SEEN  FEW (A)  Bilirubin Urine Latest Ref Range: NEGATIVE  MODERATE (A)  Color, Urine Latest Ref Range: YELLOW  RED (A)  Glucose Latest Ref Range: NEGATIVE mg/dL NEGATIVE  Hgb urine dipstick Latest Ref Range: NEGATIVE  LARGE (A)  Ketones, ur Latest Ref Range: NEGATIVE mg/dL 15 (A)  Leukocytes, UA Latest Ref Range: NEGATIVE  LARGE (A)  Nitrite Latest Ref Range: NEGATIVE  POSITIVE (A)  pH Latest Ref Range: 5.0-8.0  7.0  Protein Latest Ref Range: NEGATIVE mg/dL 100 (A)  RBC / HPF Latest Ref Range: 0-5 RBC/hpf TOO NUMEROUS TO C...  Specific Gravity, Urine Latest Ref Range: 1.005-1.030  1.012  Squamous Epithelial / LPF Latest Ref Range: NONE SEEN  NONE SEEN  Urine-Other Unknown MICROSCOPIC EXAM ...  WBC, UA Latest Ref Range: 0-5 WBC/hpf TOO NUMEROUS TO C.Marland KitchenMarland Kitchen  Iron Studies:  Recent Labs Lab 06/14/15 0250  IRON 47  TIBC 126*  FERRITIN 987*   SPEP, UPEP, ANCA, C3, C4, anti GBM, hep B and C all pending UPC  1.27 grams\  Studies/Results: Dg Chest 2 View  06/13/2015  CLINICAL DATA:  Shortness of breath for 5 days, constipation for 5 days, weakness EXAM: CHEST  2 VIEW COMPARISON:  None FINDINGS: Normal heart size and pulmonary vascularity. Tortuous thoracic aorta. Bronchitic changes with LEFT basilar atelectasis. Remaining lungs clear. No definite pleural effusion or pneumothorax. IMPRESSION: Bronchitic changes with LEFT basilar atelectasis. Electronically Signed   By: Lavonia Dana M.D.   On: 06/13/2015 17:31   US Renal  06/13/2015  CLINICAL DATA:  Renal failure EXAM: RENAL / URINARY TRACT ULTRASOUND COMPLETE COMPARISON:  None. FINDINGS: Right Kidney: Length: 12.6 cm. Normal echogenicity. Mild  hydronephrosis. There is a 12 cm renal cyst and a 3 cm renal cyst, both of which appear relatively simple. Left Kidney: Length: 12.7 cm. Mild hydronephrosis. Normal echogenicity. 14 cm cysts with numerous internal septations and wall thickening. Bladder: Numerous bladder calculi identified. There is debris layering within the bladder and the largest calculus measures about 13 mm. Prostate is enlarged at 5.4 cm. Incidentally detected is a wall echo shadow complex involving the gallbladder indicating cholelithiasis. IMPRESSION: 1. Extensive cholelithiasis 2. Enlarged prostate 3. Bladder calculi 4. Large bilateral renal cysts. On the left, a 14 cm cyst shows a complex appearance and malignancy is not excluded. Further evaluation with renal protocol CT scan or MRI suggested. 5. Mild bilateral hydronephrosis Electronically Signed   By: Skipper Cliche M.D.   On: 06/13/2015 18:48   Dg Abd 2 Views  06/13/2015  CLINICAL DATA:  Shortness of breath for 5 days, constipation for 5 days, weakness EXAM: ABDOMEN - 2 VIEW COMPARISON:  None FINDINGS: Bronchitic changes with subsegmental atelectasis at LEFT base. Normal bowel gas pattern. No bowel dilatation, bowel wall thickening or free intraperitoneal air. Probable nipple shadows on upright view. Numerous pelvic phleboliths. No acute osseous findings or urinary tract calcification. IMPRESSION: No acute abdominal findings. Electronically Signed   By: Lavonia Dana M.D.   On: 06/13/2015 17:32   Medications: .  sodium bicarbonate  infusion 1000 mL 125 mL/hr at 06/15/15 0224   . cefTRIAXone (ROCEPHIN)  IV  2 g Intravenous Q24H  . docusate sodium  100 mg Oral BID  . feeding supplement (ENSURE ENLIVE)  237 mL Oral BID BM    Background 82 y.o. year-old man with no significant PMH who developed a reduced appetite, progressive weakness, reduced urin output with some difficulty urinating, and constipation. He was taking NSAIDS's at home PTA. On presentation to the ED he had a  creatinine of 14.2, BUN 160. Mild bilateral hydronephrosis with normal kidney sizes and normal echogenicity was noted on ultrasound. Also seen was a lot of debris in the bladder and bladder stones. Foley was placed and he had an initial output of 2.15 liters but little urine since. UA shows WBC's, RBC's, protein. Creatinine remains around 12.5 (had a liter bolus of NS since admission, currently getting 2/3 isotonic bicarb at 150 per hour).  Advised transfer to [facility] for HD while continuing to investigate causes of renal failure  Impression/Plan  1. Renal failure with bilateral hydronephrosis on [place] - initially with 2L urine in the bladder, minimal UOP since foley placed. Creatinine came down a little the first day but today unchanged today at 12.5 and with uremic symptoms.  Had hydro on  [place], was on NSAIDS PTA - in the best of all worlds this would have been obstruction with NSAIDS but he is not improving with foley/IVF. Feel we need to transfer to [facility] for HD as we continue to investigate causes for his renal failure. Multiple tests are pending. Kidneys are normal size on [place] and not echogenic - if serologies yield no dx a renal biopsy, even at his age, is not out of the question.  2. Metabolic acidosis with gap of 22 - 2/3 isotonic bicarb IVF - continue until HD started. Needs IV restart.  3. Leukocytosis - possible UTI. On rocephin (this may influence IR decision re TDC vs temp - all cx neg so far and afeb today)   4. Mild anemia - ? CKD. Fe stores adequate. No ESA requirement yet  5. Bladder sludge/bladder stones - irrigate foley QShift (to make sure lack of urine output isn't related to bladder sludge occluding foley)  6. Large complex renal cyst - cannot exclude malignancy. Not the cause of his RF but will need eval at some point.  7. Constipation with no stool/+ blood on toilet seat - ? Hemorrhoids. Per primary service. Has never had a colonoscopy. The constipation is new and fairly severe - I  think this needs to be investigated  8. Elevated alk phos and bili. Gallstones on [place]. Labs normalized as of today    Jamal Maes, MD [facility] [phone] pager 06/15/2015, 3:24 PM

## 2015-06-15 NOTE — Clinical Documentation Improvement (Signed)
Nephrology/Renal  Can the diagnosis of CKD be further specified?   CKD Stage I - GFR greater than or equal to 90  CKD Stage II - GFR 60-89  CKD Stage III - GFR 30-59  CKD Stage IV - GFR 15-29  CKD Stage V - GFR < 15  ESRD (End Stage Renal Disease)  Other condition  Unable to clinically determine   Supporting Information: : (risk factors, signs and symptoms, diagnostics, treatment) On admission BUN was 160; Creatinine 14.21 and GFR 3.  As of today no real change in values.  Please exercise your independent, professional judgment when responding. A specific answer is not anticipated or expected.   Thank Modesta Messing [facility] Health Information Management [place] [phone]

## 2015-06-16 ENCOUNTER — Inpatient Hospital Stay (HOSPITAL_COMMUNITY): Payer: Medicare Other

## 2015-06-16 LAB — ANTINUCLEAR ANTIBODIES, IFA: ANA Ab, IFA: NEGATIVE

## 2015-06-16 LAB — PROTEIN ELECTROPHORESIS, SERUM
A/G RATIO SPE: 0.7 (ref 0.7–1.7)
ALPHA-1-GLOBULIN: 0.4 g/dL (ref 0.0–0.4)
Albumin ELP: 2.6 g/dL — ABNORMAL LOW (ref 2.9–4.4)
Alpha-2-Globulin: 0.9 g/dL (ref 0.4–1.0)
BETA GLOBULIN: 1.1 g/dL (ref 0.7–1.3)
GLOBULIN, TOTAL: 3.5 g/dL (ref 2.2–3.9)
Gamma Globulin: 1.1 g/dL (ref 0.4–1.8)
TOTAL PROTEIN ELP: 6.1 g/dL (ref 6.0–8.5)

## 2015-06-16 LAB — GLOMERULAR BASEMENT MEMBRANE ANTIBODIES: GBM AB: 4 U (ref 0–20)

## 2015-06-16 LAB — C3 COMPLEMENT: C3 COMPLEMENT: 155 mg/dL (ref 82–167)

## 2015-06-16 LAB — MPO/PR-3 (ANCA) ANTIBODIES: Myeloperoxidase Abs: <9 U/mL (ref 0.0–9.0)

## 2015-06-16 LAB — HEPATITIS C ANTIBODY (REFLEX): HCV Ab: <0.1 s/co ratio (ref 0.0–0.9)

## 2015-06-16 LAB — HEPATITIS B SURFACE ANTIGEN: HEP B S AG: NEGATIVE

## 2015-06-16 LAB — HCV COMMENT:

## 2015-06-16 LAB — C4 COMPLEMENT: Complement C4, Body Fluid: 32 mg/dL (ref 14–44)

## 2015-06-16 MED ORDER — HEPARIN SODIUM (PORCINE) 1000 UNIT/ML IJ SOLN
INTRAMUSCULAR | Status: AC
  Filled 2015-06-16: qty 1

## 2015-06-16 MED ORDER — LIDOCAINE HCL 1 % IJ SOLN
INTRAMUSCULAR | Status: AC
  Filled 2015-06-16: qty 20

## 2015-06-16 MED ORDER — RENA-VITE PO TABS
1.0000 | ORAL_TABLET | Freq: Every day | ORAL | Status: DC
  Administered 2015-06-17 – 2015-06-27 (×10): 1 via ORAL
  Filled 2015-06-16 (×12): qty 1

## 2015-06-16 NOTE — NC FL2 (Signed)
[place] MEDICAID FL2 LEVEL OF CARE SCREENING TOOL     IDENTIFICATION  Patient Name: Jeff Parks Birthdate: May 20, 1929 Sex: male Admission Date (Current Location): 06/13/2015  [facility] and [place] Number:  Producer, television/film/video and Address:  [facility] [place], [address] [place], [place] [address]      Provider Number: [id number]  Attending Physician Name and Address:  Penny Pia, MD  Relative Name and Phone Number:  Wake Conlee [phone])    Current Level of Care: Hospital Recommended Level of Care: Skilled Nursing Facility Prior Approval Number:    Date Approved/Denied:   PASRR Number: [id number]  Discharge Plan: SNF    Current Diagnoses: Patient Active Problem List   Diagnosis Date Noted  . UTI (urinary tract infection) 06/15/2015  . Sepsis secondary to UTI (HCC) 06/15/2015  . Hyperthyroidism 06/15/2015  . Bright red blood per rectum 06/14/2015  . Renal failure 06/13/2015  . AKI (acute kidney injury) (HCC) 06/13/2015  . Urinary retention 06/13/2015  . Fever 06/13/2015  . Anorexia 06/13/2015  . Leukocytosis 06/13/2015  . Elevated blood pressure 06/13/2015  . Anemia 06/13/2015  . Weakness     Orientation RESPIRATION BLADDER Height & Weight     Self, Time, Situation, Place  Normal Continent Weight: 179 lb 10.8 oz (81.5 kg) Height:  6' (182.9 cm)  BEHAVIORAL SYMPTOMS/MOOD NEUROLOGICAL BOWEL NUTRITION STATUS      Incontinent Diet (See discharge summary)  AMBULATORY STATUS COMMUNICATION OF NEEDS Skin   Limited Assist Verbally Normal                       Personal Care Assistance Level of Assistance  Bathing, Feeding, Dressing Bathing Assistance: Maximum assistance Feeding assistance: Independent Dressing Assistance: Maximum assistance (Upper body dressing: minimal assist with set up)     Functional Limitations Info  Sight, Hearing, Speech Sight Info: Adequate (Reading glasses only ) Hearing Info: Adequate Speech Info:  Adequate    SPECIAL CARE FACTORS FREQUENCY  PT (By licensed PT), OT (By licensed OT)     PT Frequency: Evaluated: 06-15-15, Min 3x/week OT Frequency: Evaluated: 06-15-15, Min 2x/week             Contractures      Additional Factors Info  Code Status, Allergies Code Status Info: FULL CODE  Allergies Info: No known allergies            Current Medications (06/16/2015):  This is the current hospital active medication list Current Facility-Administered Medications  Medication Dose Route Frequency Provider Last Rate Last Dose  . acetaminophen (TYLENOL) tablet 650 mg  650 mg Oral Q6H PRN Ron Parker, MD       Or  . acetaminophen (TYLENOL) suppository 650 mg  650 mg Rectal Q6H PRN Ron Parker, MD      . calcium acetate (PHOSLO) capsule 667 mg  667 mg Oral TID WC Camille Bal, MD   667 mg at 06/15/15 1744  . cefTRIAXone (ROCEPHIN) 2 g in dextrose 5 % 50 mL IVPB  2 g Intravenous Q24H Winfield Rast, RPH   2 g at 06/15/15 1737  . docusate sodium (COLACE) capsule 100 mg  100 mg Oral BID Renae Fickle, MD   100 mg at 06/14/15 2200  . feeding supplement (ENSURE ENLIVE) (ENSURE ENLIVE) liquid 237 mL  237 mL Oral BID BM Ron Parker, MD   237 mL at 06/14/15 1500  . heparin 1000 UNIT/ML injection           .  lidocaine (XYLOCAINE) 1 % (with pres) injection           . multivitamin (RENA-VIT) tablet 1 tablet  1 tablet Oral QHS Weston Settle, PA-C      . ondansetron [facility]) tablet 4 mg  4 mg Oral Q6H PRN Ron Parker, MD       Or  . ondansetron (ZOFRAN) injection 4 mg  4 mg Intravenous Q6H PRN Ron Parker, MD      . oxyCODONE (Oxy IR/ROXICODONE) immediate release tablet 5 mg  5 mg Oral Q4H PRN Ron Parker, MD   5 mg at 06/16/15 0051  . polyethylene glycol (MIRALAX / GLYCOLAX) packet 17 g  17 g Oral Daily PRN Renae Fickle, MD      . sodium bicarbonate 100 mEq in dextrose 5 % 1,000 mL infusion   Intravenous Continuous Renae Fickle, MD 125  mL/hr at 06/16/15 0051       Discharge Medications: Please see discharge summary for a list of discharge medications.  Relevant Imaging Results:  Relevant Lab Results:   Additional Information SSN: [id number]  Baldo Daub, Student-SW [phone]

## 2015-06-16 NOTE — Care Management Important Message (Signed)
Important Message  Patient Details  Name: Jeff Parks MRN: [id number] Date of Birth: October 02, 1929   Medicare Important Message Given:  Yes    [name] [name] 06/16/2015, 12:08 PM

## 2015-06-16 NOTE — Clinical Social Work Placement (Signed)
   CLINICAL SOCIAL WORK PLACEMENT  NOTE  Date:  06/16/2015  Patient Details  Name: Sourish Allender MRN: [id number] Date of Birth: 1929/06/09  Clinical Social Work is seeking post-discharge placement for this patient at the Skilled  Nursing Facility level of care (*CSW will initial, date and re-position this form in  chart as items are completed):  Yes   Patient/family provided with [place] Clinical Social Work Department's list of facilities offering this level of care within the geographic area requested by the patient (or if unable, by the patient's family).  Yes   Patient/family informed of their freedom to choose among providers that offer the needed level of care, that participate in Medicare, Medicaid or managed care program needed by the patient, have an available bed and are willing to accept the patient.  Yes   Patient/family informed of [facility]'s ownership interest in [facility] and [facility], as well as of the fact that they are under no obligation to receive care at these facilities.  PASRR submitted to EDS on       PASRR number received on 06/16/15     Existing PASRR number confirmed on 06/16/15     FL2 transmitted to all facilities in geographic area requested by pt/family on 06/16/15     FL2 transmitted to all facilities within larger geographic area on 06/16/15     Patient informed that his/her managed care company has contracts with or will negotiate with certain facilities, including the following:            Patient/family informed of bed offers received.  Patient chooses bed at       Physician recommends and patient chooses bed at      Patient to be transferred to   on  .  Patient to be transferred to facility by       Patient family notified on   of transfer.  Name of family member notified:        PHYSICIAN Please sign FL2, Please prepare priority discharge summary, including medications     Additional Comment:     _______________________________________________ Baldo Daub, Student-SW 06/16/2015, 11:32 AM

## 2015-06-16 NOTE — Progress Notes (Signed)
Patient arrived on unit via [facility]. Patient alert and oriented x4. Patient oriented to unit, staff and room. Patient's daughter at bedside. Patient placed on telemetry monitor, CCMD notified. Skin assessment completed with two RNs, no skin issues noted. Patient's IV clean, dry and infusing. Patient denies pain. Safety Fall Prevention Plan was given, discussed and signed. Orders have been reviewed and implemented. Call light has been placed within reach and bed alarm has been activated. RN will continue to monitor the patient.  Rivka Barbara BSN, RN  Phone Number: [phone]

## 2015-06-16 NOTE — Procedures (Signed)
Placement of right jugular Trialysis catheter.  Tip at SVC/RA junction.  No immediate complication.  Catheter is ready to use.

## 2015-06-16 NOTE — Progress Notes (Signed)
[facility] PROGRESS NOTE  Jeff Parks VFI:[id number] DOB: 1930/01/03 DOA: 06/13/2015 PCP: No primary care provider on file.  Assessment/Plan:  Principal Problem:   AKI (acute kidney injury) [facility]) - Nephrology on board and managing - Renal failure with BL hydronephrosis on [place]  Active Problems:   Urinary retention - foley to be irrigated q shift - As per urology recommendation patient will need TURP as well as cystoscopy for bladder stone as well as BPH in future once he is clinically more stable.    Anorexia   Leukocytosis - suspecting uti. Continue rocephin    Elevated blood pressure - we have had fluctuations in BP's    Anemia - no active bleeding reported.    Bright red blood per rectum -No active evidence of significant bleeding. Will check H&H every 12 hours.   Code Status: full Family Communication: d/c patient directly Disposition Plan: Pending recommendations from specialist   Consultants:  Nephrology  Radiology  Procedures:  HD  Antibiotics:  Rocephin  HPI/Subjective: Pt has no new complaints  Objective: Filed Vitals:   06/16/15 0442 06/16/15 1040  BP: 131/64 173/65  Pulse: 105 110  Temp: 97.9 F (36.6 C) 98.4 F (36.9 C)  Resp: 20 20    Intake/Output Summary (Last 24 hours) at 06/16/15 1412 Last data filed at 06/16/15 0900  Gross per 24 hour  Intake   1875 ml  Output     30 ml  Net   1845 ml   Filed Weights   06/13/15 2025  Weight: 81.5 kg (179 lb 10.8 oz)    Exam:   General:  Pt in nad, alert and awake  Cardiovascular: rrr, no mrg  Respiratory: cta bl, no wheezes  Abdomen: soft, no guarding  Musculoskeletal: no cyanosis on limited exam   Data Reviewed: Basic Metabolic Panel:  Recent Labs Lab 06/13/15 1116 06/14/15 0250 06/15/15 0437  NA 140 143 140  K 5.0 4.4 4.0  CL 101 106 104  CO2 15* 15* 16*  GLUCOSE 110* 127* 171*  BUN 160* 151* 162*  CREATININE 14.21* 12.57* 12.50*  CALCIUM 8.8* 8.3* 7.8*   PHOS  --   --  5.8*   Liver Function Tests:  Recent Labs Lab 06/13/15 1116 06/15/15 0437  AST 40 29  ALT 62 43  ALKPHOS 203* 133*  BILITOT 1.3* 0.9  PROT 8.0 6.6  ALBUMIN 2.6* 2.0*    Recent Labs Lab 06/13/15 1116 06/15/15 1152  LIPASE 54* 49   No results for input(s): AMMONIA in the last 168 hours. CBC:  Recent Labs Lab 06/13/15 1116 06/14/15 0250 06/15/15 0437  WBC 19.3* 20.4* 22.3*  HGB 11.7* 11.0* 10.1*  HCT 34.4* 31.8* 28.6*  MCV 82.1 81.5 81.0  PLT 383 390 371   Cardiac Enzymes: No results for input(s): CKTOTAL, CKMB, CKMBINDEX, TROPONINI in the last 168 hours. BNP (last 3 results) No results for input(s): BNP in the last 8760 hours.  ProBNP (last 3 results) No results for input(s): PROBNP in the last 8760 hours.  CBG: No results for input(s): GLUCAP in the last 168 hours.  Recent Results (from the past 240 hour(s))  Culture, blood (x 2)     Status: None (Preliminary result)   Collection Time: 06/13/15 11:03 PM  Result Value Ref Range Status   Specimen Description BLOOD LEFT ARM  Final   Special Requests BOTTLES DRAWN AEROBIC AND ANAEROBIC 5 CC  Final   Culture   Final    NO GROWTH 2 DAYS Performed  at [facility]    Report Status PENDING  Incomplete  Culture, blood (x 2)     Status: None (Preliminary result)   Collection Time: 06/13/15 11:04 PM  Result Value Ref Range Status   Specimen Description BLOOD RIGHT HAND  Final   Special Requests BOTTLES DRAWN AEROBIC AND ANAEROBIC 5 CC  Final   Culture   Final    NO GROWTH 2 DAYS Performed at [facility]    Report Status PENDING  Incomplete  Culture, Urine     Status: None   Collection Time: 06/14/15  7:41 AM  Result Value Ref Range Status   Specimen Description URINE, RANDOM  Final   Special Requests NONE  Final   Culture   Final    NO GROWTH 1 DAY Performed at [facility]    Report Status 06/15/2015 FINAL  Final     Studies: Ir Fluoro Guide Cv Line  Right  06/16/2015  INDICATION: Renal failure and needs hemodialysis. Plan for placement of non tunneled catheter due to leukocytosis and possible urinary tract infection. EXAM: FLUOROSCOPIC AND ULTRASOUND GUIDED PLACEMENT OF A NON TUNNELED DIALYSIS CATHETER Physician: Rachelle Hora. Henn, MD MEDICATIONS: None ANESTHESIA/SEDATION: None FLUOROSCOPY TIME:  Fluoroscopy Time: 24 seconds, 1.3 mGy COMPLICATIONS: None immediate. PROCEDURE: Informed consent was obtained for placement of a tunneled dialysis catheter. The patient was placed supine on the interventional table. Ultrasound confirmed a patent right internal jugular vein. Ultrasound images were obtained for documentation. The right side of the neck was prepped and draped in a sterile fashion. The right side of the neck was anesthetized with 1% lidocaine. Maximal barrier sterile technique was utilized including caps, mask, sterile gowns, sterile gloves, sterile drape, hand hygiene and skin antiseptic. A small incision was made with #11 blade scalpel. A 21 gauge needle directed into the right internal jugular vein with ultrasound guidance. A micropuncture dilator set was placed. J wire was advanced into the central veins. A 20 cm Trialysis catheter was advanced over the wire and positioned at the superior cavoatrial junction. Both dialysis lumens were found to aspirate and flush well. The proper amount of heparin was placed in both lumens. The central lumen aspirated and flushed well. Central lumen was flushed with normal saline. The catheter was secured to the skin using Prolene suture. Fluoroscopic and ultrasound images were taken and saved for documentation. FINDINGS: Catheter tip at the superior cavoatrial junction. IMPRESSION: Successful placement of a non tunneled dialysis catheter using ultrasound and fluoroscopic guidance. Electronically Signed   By: Richarda Overlie M.D.   On: 06/16/2015 12:32   Ir US Guide Vasc Access Right  06/16/2015  INDICATION: Renal failure and  needs hemodialysis. Plan for placement of non tunneled catheter due to leukocytosis and possible urinary tract infection. EXAM: FLUOROSCOPIC AND ULTRASOUND GUIDED PLACEMENT OF A NON TUNNELED DIALYSIS CATHETER Physician: Rachelle Hora. Henn, MD MEDICATIONS: None ANESTHESIA/SEDATION: None FLUOROSCOPY TIME:  Fluoroscopy Time: 24 seconds, 1.3 mGy COMPLICATIONS: None immediate. PROCEDURE: Informed consent was obtained for placement of a tunneled dialysis catheter. The patient was placed supine on the interventional table. Ultrasound confirmed a patent right internal jugular vein. Ultrasound images were obtained for documentation. The right side of the neck was prepped and draped in a sterile fashion. The right side of the neck was anesthetized with 1% lidocaine. Maximal barrier sterile technique was utilized including caps, mask, sterile gowns, sterile gloves, sterile drape, hand hygiene and skin antiseptic. A small incision was made with #11 blade scalpel. A  21 gauge needle directed into the right internal jugular vein with ultrasound guidance. A micropuncture dilator set was placed. J wire was advanced into the central veins. A 20 cm Trialysis catheter was advanced over the wire and positioned at the superior cavoatrial junction. Both dialysis lumens were found to aspirate and flush well. The proper amount of heparin was placed in both lumens. The central lumen aspirated and flushed well. Central lumen was flushed with normal saline. The catheter was secured to the skin using Prolene suture. Fluoroscopic and ultrasound images were taken and saved for documentation. FINDINGS: Catheter tip at the superior cavoatrial junction. IMPRESSION: Successful placement of a non tunneled dialysis catheter using ultrasound and fluoroscopic guidance. Electronically Signed   By: Richarda Overlie M.D.   On: 06/16/2015 12:32    Scheduled Meds: . calcium acetate  667 mg Oral TID WC  . cefTRIAXone (ROCEPHIN)  IV  2 g Intravenous Q24H  . docusate  sodium  100 mg Oral BID  . feeding supplement (ENSURE ENLIVE)  237 mL Oral BID BM  . heparin      . lidocaine      . multivitamin  1 tablet Oral QHS   Continuous Infusions: .  sodium bicarbonate  infusion 1000 mL 125 mL/hr at 06/16/15 0051    Time spent: > 35 minutes  Penny Pia  [facility] Pager [phone] If 7PM-7AM, please contact night-coverage at [email or web address], password [facility] 06/16/2015, 2:12 PM  LOS: 3 days

## 2015-06-16 NOTE — Progress Notes (Signed)
[facility] Progress Note  Background 82 y.o. year-old man with no significant PMH who developed a reduced appetite, progressive weakness, reduced urin output with some difficulty urinating, and constipation. He was taking NSAIDS's at home PTA. On presentation to the ED he had a creatinine of 14.2, BUN 160. Mild bilateral hydronephrosis with normal kidney sizes and normal echogenicity was noted on ultrasound. Also seen was a lot of debris in the bladder and bladder stones. Foley was placed and he had an initial output of 2.15 liters but has had little urine since. UA shows WBC's, RBC's, protein. Creatinine around 12.5, BUN 160's. No improvement with fluids.  We were asked to see.  Assessment/Plan: 1. Renal failure with bilateral hydronephrosis on [place] - initially  2L urine in the bladder, minimal UOP since foley placed. Creatinine came down a little the first day but  unchanged 2/15 at 12.5  BUN 162, K 4.0, CO2 16 and with uremic symptoms. Was on NSAIDS PTA - in the best of all worlds this would have been obstruction plus  NSAIDS but he is not improving with foley/IVF. Transferred  to [facility] for HD as we continue to investigate causes for his renal failure. Multiple tests are pending. Kidneys are normal size on [place] and not echogenic - if serologies yield no dx a renal biopsy, even at his age, is not out of the question. He will have a catheter placed today in IR with HD to follow - labs will be be drawn with HD. Output today 280. (800 2/14-2/15)  2. Metabolic acidosis with gap of 22 - 2/3 isotonic bicarb IVF - continue until HD started later today  3. Leukocytosis - possible UTI, but urine culture no growth final. On rocephin WBC still elevated 2/15 - recheck with pre HD labs today  4. Mild anemia - Hgb 10.1 possibly due to CKD. Fe stores adequate 37% ferritin 987  sat  B12 1686 No ESA requirement yet  5. Bladder sludge/bladder stones - irrigate foley QShift (to make sure lack of  urine output isn't related to bladder sludge occluding foley)  6. Large complex renal cyst - cannot exclude malignancy. Not the cause of his RF but will need eval at some point.  7. Constipation with no stool/+ blood on toilet seat - ? Hemorrhoids. Per primary service. Has never had a colonoscopy. The constipation is new and fairly severe - I think this needs to be investigated  8. Elevated alk phos and bili. Gallstones on [place]. Labs normalized as of today  9. MBD - check iPTH -  P 5.8 - on phoslo - last Ca 7.8  10. Nutrition Alb 2 -NPO at present/Ensure when taking pos - add multivit  Myriam Jacobson, PA-C [place] 06/16/2015,11:21 AM  LOS: 3 days   I have seen and examined this patient and agree with plan and assessment. For HD today and another treatment tomorrow for uremia. Still not sure if this is acute or chronic.As urology points out they  suspect longstanding, since no discomfort from an overly expanded bladder with 2 liters in it on admission.  They recommend maintaining foley, and possible further urologic eval in the future. Pt has temp cath rather than tunnelled d/t his leukocytosis.    Hevin Jeffcoat B,MD 06/16/2015 6:31 PM  Subjective:    Still has hiccups  Objective Filed Vitals:   06/15/15 2127 06/15/15 2228 06/16/15 0442 06/16/15 1040  BP: 166/94 156/73 131/64 173/65  Pulse: 121 114 105 110  Temp: 98.4 F (36.9 C) 97.8 F (36.6 C) 97.9 F (36.6 C) 98.4 F (36.9 C)  TempSrc:  Oral Oral Oral  Resp:  '20 20 20  ' Height:      Weight:      SpO2: 94% 95% 99% 98%   Physical Exam  General: Thin older WM NAD Heart: Regular S1S2 No S3 Lungs: Clear Abdomen: Soft and not tende (IR 2/16)r Extremities: No edema Dialysis Access: Right jugular trialysis catheter  Dialysis Orders:  Additional Objective Labs: Basic Metabolic Panel:  Recent Labs Lab 06/13/15 1116 06/14/15 0250 06/15/15 0437  NA 140 143 140  K 5.0 4.4 4.0   CL 101 106 104  CO2 15* 15* 16*  GLUCOSE 110* 127* 171*  BUN 160* 151* 162*  CREATININE 14.21* 12.57* 12.50*  CALCIUM 8.8* 8.3* 7.8*  PHOS  --   --  5.8*   Liver Function Tests:  Recent Labs Lab 06/13/15 1116 06/15/15 0437  AST 40 29  ALT 62 43  ALKPHOS 203* 133*  BILITOT 1.3* 0.9  PROT 8.0 6.6  ALBUMIN 2.6* 2.0*    Recent Labs Lab 06/13/15 1116 06/15/15 1152  LIPASE 54* 49   CBC:  Recent Labs Lab 06/13/15 1116 06/14/15 0250 06/15/15 0437  WBC 19.3* 20.4* 22.3*  HGB 11.7* 11.0* 10.1*  HCT 34.4* 31.8* 28.6*  MCV 82.1 81.5 81.0  PLT 383 390 371   Blood Culture    Component Value Date/Time   SDES URINE, RANDOM 06/14/2015 0741   SPECREQUEST NONE 06/14/2015 0741   CULT  06/14/2015 0741    NO GROWTH 1 DAY Performed at [facility] 06/15/2015 FINAL 06/14/2015 0741    Iron Studies:  Recent Labs  06/14/15 0250  IRON 47  TIBC 126*  FERRITIN 987*   ANCA neg, complements normal, Hep B and C negative, UPEP no m-spike, SPEP no M-spike  Medications: .  sodium bicarbonate  infusion 1000 mL 125 mL/hr at 06/16/15 0051   . calcium acetate  667 mg Oral TID WC  . cefTRIAXone (ROCEPHIN)  IV  2 g Intravenous Q24H  . docusate sodium  100 mg Oral BID  . feeding supplement (ENSURE ENLIVE)  237 mL Oral BID BM  . heparin      . lidocaine

## 2015-06-16 NOTE — Progress Notes (Signed)
Referring Physician(s): Eliott Nine  Chief Complaint:  Acute kidney injury Cr worsening  Subjective:  No known previous illness--no baseline Cr Admitted through ED 2/13 with anorexia; urinary retention x 3 days---no urination x 12 hrs or more Fatigue; weakness; hiccups Uremia  US shows mild B hydronephrosis and stones BRBPR---not active now  Request for tunneled dialysis catheter per Dr Eliott Nine Still with Wbc 22.3-- UTI 2/13 Afeb; Cx neg  Will plan for temporary catheter placement today in IR Possible conversion when leukocytosis is resolved   Allergies: Review of patient's allergies indicates no known allergies.  Medications: Prior to Admission medications   Medication Sig Start Date End Date Taking? Authorizing Provider  Multiple Vitamins-Minerals (MULTIVITAMIN ADULT PO) Take 1 tablet by mouth daily.   Yes Historical Provider, MD  naproxen sodium (ANAPROX) 220 MG tablet Take 400 mg by mouth every 12 (twelve) hours as needed (PAIN).   Yes Historical Provider, MD     Vital Signs: BP 131/64 mmHg  Pulse 105  Temp(Src) 97.9 F (36.6 C) (Oral)  Resp 20  Ht 6' (1.829 m)  Wt 179 lb 10.8 oz (81.5 kg)  BMI 24.36 kg/m2  SpO2 99%  Physical Exam  Cardiovascular: Normal rate and regular rhythm.   Pulmonary/Chest: Effort normal and breath sounds normal.  Abdominal: Soft. Bowel sounds are normal.  Musculoskeletal: Normal range of motion.  Neurological: He is alert.  Slight confusion Able to answer name and dob -- although with dificulty  Skin: Skin is warm and dry.  Psychiatric: He has a normal mood and affect.  Consented dtr at bedside  Nursing note and vitals reviewed.   Imaging: Dg Chest 2 View  06/13/2015  CLINICAL DATA:  Shortness of breath for 5 days, constipation for 5 days, weakness EXAM: CHEST  2 VIEW COMPARISON:  None FINDINGS: Normal heart size and pulmonary vascularity. Tortuous thoracic aorta. Bronchitic changes with LEFT basilar atelectasis. Remaining  lungs clear. No definite pleural effusion or pneumothorax. IMPRESSION: Bronchitic changes with LEFT basilar atelectasis. Electronically Signed   By: Ulyses Southward M.D.   On: 06/13/2015 17:31   US Renal  06/13/2015  CLINICAL DATA:  Renal failure EXAM: RENAL / URINARY TRACT ULTRASOUND COMPLETE COMPARISON:  None. FINDINGS: Right Kidney: Length: 12.6 cm. Normal echogenicity. Mild hydronephrosis. There is a 12 cm renal cyst and a 3 cm renal cyst, both of which appear relatively simple. Left Kidney: Length: 12.7 cm. Mild hydronephrosis. Normal echogenicity. 14 cm cysts with numerous internal septations and wall thickening. Bladder: Numerous bladder calculi identified. There is debris layering within the bladder and the largest calculus measures about 13 mm. Prostate is enlarged at 5.4 cm. Incidentally detected is a wall echo shadow complex involving the gallbladder indicating cholelithiasis. IMPRESSION: 1. Extensive cholelithiasis 2. Enlarged prostate 3. Bladder calculi 4. Large bilateral renal cysts. On the left, a 14 cm cyst shows a complex appearance and malignancy is not excluded. Further evaluation with renal protocol CT scan or MRI suggested. 5. Mild bilateral hydronephrosis Electronically Signed   By: Esperanza Heir M.D.   On: 06/13/2015 18:48   Dg Abd 2 Views  06/13/2015  CLINICAL DATA:  Shortness of breath for 5 days, constipation for 5 days, weakness EXAM: ABDOMEN - 2 VIEW COMPARISON:  None FINDINGS: Bronchitic changes with subsegmental atelectasis at LEFT base. Normal bowel gas pattern. No bowel dilatation, bowel wall thickening or free intraperitoneal air. Probable nipple shadows on upright view. Numerous pelvic phleboliths. No acute osseous findings or urinary tract calcification. IMPRESSION: No acute  abdominal findings. Electronically Signed   By: Ulyses Southward M.D.   On: 06/13/2015 17:32    Labs:  CBC:  Recent Labs  06/13/15 1116 06/14/15 0250 06/15/15 0437  WBC 19.3* 20.4* 22.3*  HGB 11.7*  11.0* 10.1*  HCT 34.4* 31.8* 28.6*  PLT 383 390 371    COAGS:  Recent Labs  06/13/15 2305  INR 1.41  APTT 34    BMP:  Recent Labs  06/13/15 1116 06/14/15 0250 06/15/15 0437  NA 140 143 140  K 5.0 4.4 4.0  CL 101 106 104  CO2 15* 15* 16*  GLUCOSE 110* 127* 171*  BUN 160* 151* 162*  CALCIUM 8.8* 8.3* 7.8*  CREATININE 14.21* 12.57* 12.50*  GFRNONAA 3* 3* 3*  GFRAA 3* 4* 4*    LIVER FUNCTION TESTS:  Recent Labs  06/13/15 1116 06/15/15 0437  BILITOT 1.3* 0.9  AST 40 29  ALT 62 43  ALKPHOS 203* 133*  PROT 8.0 6.6  ALBUMIN 2.6* 2.0*    Assessment and Plan:  Acute kidney injury Cr 12.5 Uremia Scheduled for temporary dialysis catheter placement in IR today Pts family aware of procedure benefits and risks including but not limited to: infection; bleeding; Vessel damage; damage to surrounding structures Agreeable to proceed Consent signed and in chart  Electronically Signed: Veverly Larimer A 06/16/2015, 7:54 AM   I spent a total of 25 Minutes at the the patient's bedside AND on the patient's hospital floor or unit, greater than 50% of which was counseling/coordinating care for temp cath

## 2015-06-17 DIAGNOSIS — D631 Anemia in chronic kidney disease: Secondary | ICD-10-CM

## 2015-06-17 DIAGNOSIS — N189 Chronic kidney disease, unspecified: Secondary | ICD-10-CM

## 2015-06-17 LAB — RENAL FUNCTION PANEL
ALBUMIN: 1.7 g/dL — AB (ref 3.5–5.0)
ANION GAP: 22 — AB (ref 5–15)
BUN: 132 mg/dL — AB (ref 6–20)
CHLORIDE: 97 mmol/L — AB (ref 101–111)
CO2: 21 mmol/L — ABNORMAL LOW (ref 22–32)
Calcium: 7.8 mg/dL — ABNORMAL LOW (ref 8.9–10.3)
Creatinine, Ser: 13.52 mg/dL — ABNORMAL HIGH (ref 0.61–1.24)
GFR calc Af Amer: 3 mL/min — ABNORMAL LOW (ref >60)
GFR, EST NON AFRICAN AMERICAN: 3 mL/min — AB (ref >60)
Glucose, Bld: 150 mg/dL — ABNORMAL HIGH (ref 65–99)
PHOSPHORUS: 7.1 mg/dL — AB (ref 2.5–4.6)
POTASSIUM: 4.2 mmol/L (ref 3.5–5.1)
Sodium: 140 mmol/L (ref 135–145)

## 2015-06-17 LAB — CBC
HCT: 29.1 % — ABNORMAL LOW (ref 39.0–52.0)
HEMOGLOBIN: 10.2 g/dL — AB (ref 13.0–17.0)
MCH: 28.5 pg (ref 26.0–34.0)
MCHC: 35.1 g/dL (ref 30.0–36.0)
MCV: 81.3 fL (ref 78.0–100.0)
PLATELETS: 302 10*3/uL (ref 150–400)
RBC: 3.58 MIL/uL — AB (ref 4.22–5.81)
RDW: 13.8 % (ref 11.5–15.5)
WBC: 29.3 10*3/uL — ABNORMAL HIGH (ref 4.0–10.5)

## 2015-06-17 LAB — CBC WITH DIFFERENTIAL/PLATELET
BASOS ABS: 0 10*3/uL (ref 0.0–0.1)
Basophils Relative: 0 %
EOS PCT: 1 %
Eosinophils Absolute: 0.3 10*3/uL (ref 0.0–0.7)
HCT: 29.7 % — ABNORMAL LOW (ref 39.0–52.0)
Hemoglobin: 10.1 g/dL — ABNORMAL LOW (ref 13.0–17.0)
Lymphocytes Relative: 6 %
Lymphs Abs: 1.9 10*3/uL (ref 0.7–4.0)
MCH: 27.7 pg (ref 26.0–34.0)
MCHC: 34 g/dL (ref 30.0–36.0)
MCV: 81.6 fL (ref 78.0–100.0)
MONO ABS: 0.9 10*3/uL (ref 0.1–1.0)
Monocytes Relative: 3 %
Neutro Abs: 27.8 10*3/uL — ABNORMAL HIGH (ref 1.7–7.7)
Neutrophils Relative %: 90 %
Platelets: 307 10*3/uL (ref 150–400)
RBC: 3.64 MIL/uL — AB (ref 4.22–5.81)
RDW: 13.7 % (ref 11.5–15.5)
WBC: 30.9 10*3/uL — AB (ref 4.0–10.5)

## 2015-06-17 LAB — HEPATITIS B SURFACE ANTIBODY,QUALITATIVE: HEP B S AB: NONREACTIVE

## 2015-06-17 LAB — HEPATITIS B CORE ANTIBODY, TOTAL: HEP B C TOTAL AB: NEGATIVE

## 2015-06-17 NOTE — Progress Notes (Signed)
Subjective:  Feels slightly better ,tolerated first HD yest. / for sec hd today/ no urine in foley today  Objective Vital signs in last 24 hours: Filed Vitals:   06/16/15 1637 06/16/15 1818 06/16/15 2045 06/17/15 0535  BP: 133/67 149/71 133/57 123/64  Pulse: 118 118 114 103  Temp: 98.6 F (37 C) 97.8 F (36.6 C) 98.8 F (37.1 C) 98.6 F (37 C)  TempSrc: Oral Oral Oral   Resp: '20 20 18 18  ' Height:      Weight:      SpO2: 98% 99% 99% 96%   Weight change:   Physical Exam: General: Thin Elderly  [name] NAD , appears appropriate Heart: RRR  no m, r, g /No S3 Lungs: CTA  bilat Abdomen: Soft and not tender Extremities: No  Pedal edema Dialysis Access: Right jugular trialysis catheter   Problem/Plan: 1. Renal failure with bilateral hydronephrosis on [place] -Was on NSAIDS/ initially 2L urine in the bladder, minimal UOP since foley placed. Creatinine came down a little the first day but unchanged 2/15 at 12.5 BUN 162, K 4.0, CO2 16 and with uremic symptoms. labs pend HD today /  PTA - in the best of all worlds this would have been obstruction plus NSAIDS but he is not improving with foley/IVF. Transferred to [facility] for HD as we continue to investigate causes for his renal failure.  Kidneys are normal size on [place] and not echogenic - if serologies yield no dx a renal biopsy, even at his age, is not out of the question.  Temporary  HD catheter placed yesterday in IR with HD#1 yesterday and for HD #2 today.  Will also re-ultrasound to see of hydro has resolved with bladder decompression.   2. Metabolic acidosis- labs pend today pre hd / isotonic bicarb IVF dc after continue until HD started yest.  3. Leukocytosis -20.4>22.3> 29.3 today/ possible UTI, but urine culture no growth final./ BC 2/14= No growth so far / AFEBRILE/ On rocephin  4. Mild anemia - Hgb 10.1 labs pending pre hd today / possibly due to CKD. Fe stores adequate 37% ferritin 987 sat B12 1686 No ESA requirement  yet  5. Bladder sludge/bladder stones - irrigate foley QShift (to make sure lack of urine output isn't related to bladder sludge occluding foley)/ RX per urology  6. Large complex renal cyst - cannot exclude malignancy. Not the cause of his RF but will need eval at some point. / Urology RX   7.    Elevated alk phos and bili. Gallstones on [place]. Labs normalized  8      MBD - check iPTH - P 5.8 - on phoslo - last Ca 7.8  9      Nutrition - Alb 2.0 - Ensure / renal diet -  multivit   Larin Haber, PA-C [place] [phone] 06/17/2015,8:35 AM  LOS: 4 days   I have seen and examined this patient and agree with plan and assessment above. For second HD today No labs done pre HD as ordered. Check labs in AM and decide on more HD. Repeat renal US to determine if hydro has resolved with bladder decompression (making no urine after large amyt initially in the bladder) Dalonda Simoni B,MD 06/17/2015 11:11 PM  Labs: Basic Metabolic Panel:  Recent Labs Lab 06/13/15 1116 06/14/15 0250 06/15/15 0437  NA 140 143 140  K 5.0 4.4 4.0  CL 101 106 104  CO2 15* 15* 16*  GLUCOSE 110* 127* 171*  BUN  160* 151* 162*  CREATININE 14.21* 12.57* 12.50*  CALCIUM 8.8* 8.3* 7.8*  PHOS  --   --  5.8*   Liver Function Tests:  Recent Labs Lab 06/13/15 1116 06/15/15 0437  AST 40 29  ALT 62 43  ALKPHOS 203* 133*  BILITOT 1.3* 0.9  PROT 8.0 6.6  ALBUMIN 2.6* 2.0*    Recent Labs Lab 06/13/15 1116 06/15/15 1152  LIPASE 54* 49    Recent Labs Lab 06/13/15 1116 06/14/15 0250 06/15/15 0437  WBC 19.3* 20.4* 22.3*  HGB 11.7* 11.0* 10.1*  HCT 34.4* 31.8* 28.6*  MCV 82.1 81.5 81.0  PLT 383 390 371   Studies/Results: Ir Fluoro Guide Cv Line Right  06/16/2015  INDICATION: Renal failure and needs hemodialysis. Plan for placement of non tunneled catheter due to leukocytosis and possible urinary tract infection. EXAM: FLUOROSCOPIC AND ULTRASOUND GUIDED PLACEMENT OF A NON  TUNNELED DIALYSIS CATHETER Physician: Stephan Minister. Henn, MD MEDICATIONS: None ANESTHESIA/SEDATION: None FLUOROSCOPY TIME:  Fluoroscopy Time: 24 seconds, 1.3 mGy COMPLICATIONS: None immediate. PROCEDURE: Informed consent was obtained for placement of a tunneled dialysis catheter. The patient was placed supine on the interventional table. Ultrasound confirmed a patent right internal jugular vein. Ultrasound images were obtained for documentation. The right side of the neck was prepped and draped in a sterile fashion. The right side of the neck was anesthetized with 1% lidocaine. Maximal barrier sterile technique was utilized including caps, mask, sterile gowns, sterile gloves, sterile drape, hand hygiene and skin antiseptic. A small incision was made with #11 blade scalpel. A 21 gauge needle directed into the right internal jugular vein with ultrasound guidance. A micropuncture dilator set was placed. J wire was advanced into the central veins. A 20 cm Trialysis catheter was advanced over the wire and positioned at the superior cavoatrial junction. Both dialysis lumens were found to aspirate and flush well. The proper amount of heparin was placed in both lumens. The central lumen aspirated and flushed well. Central lumen was flushed with normal saline. The catheter was secured to the skin using Prolene suture. Fluoroscopic and ultrasound images were taken and saved for documentation. FINDINGS: Catheter tip at the superior cavoatrial junction. IMPRESSION: Successful placement of a non tunneled dialysis catheter using ultrasound and fluoroscopic guidance. Electronically Signed   By: Markus Daft M.D.   On: 06/16/2015 12:32   Ir US Guide Vasc Access Right  06/16/2015  INDICATION: Renal failure and needs hemodialysis. Plan for placement of non tunneled catheter due to leukocytosis and possible urinary tract infection. EXAM: FLUOROSCOPIC AND ULTRASOUND GUIDED PLACEMENT OF A NON TUNNELED DIALYSIS CATHETER Physician: Stephan Minister.  Henn, MD MEDICATIONS: None ANESTHESIA/SEDATION: None FLUOROSCOPY TIME:  Fluoroscopy Time: 24 seconds, 1.3 mGy COMPLICATIONS: None immediate. PROCEDURE: Informed consent was obtained for placement of a tunneled dialysis catheter. The patient was placed supine on the interventional table. Ultrasound confirmed a patent right internal jugular vein. Ultrasound images were obtained for documentation. The right side of the neck was prepped and draped in a sterile fashion. The right side of the neck was anesthetized with 1% lidocaine. Maximal barrier sterile technique was utilized including caps, mask, sterile gowns, sterile gloves, sterile drape, hand hygiene and skin antiseptic. A small incision was made with #11 blade scalpel. A 21 gauge needle directed into the right internal jugular vein with ultrasound guidance. A micropuncture dilator set was placed. J wire was advanced into the central veins. A 20 cm Trialysis catheter was advanced over the wire and positioned at the superior cavoatrial  junction. Both dialysis lumens were found to aspirate and flush well. The proper amount of heparin was placed in both lumens. The central lumen aspirated and flushed well. Central lumen was flushed with normal saline. The catheter was secured to the skin using Prolene suture. Fluoroscopic and ultrasound images were taken and saved for documentation. FINDINGS: Catheter tip at the superior cavoatrial junction. IMPRESSION: Successful placement of a non tunneled dialysis catheter using ultrasound and fluoroscopic guidance. Electronically Signed   By: Markus Daft M.D.   On: 06/16/2015 12:32   Medications:   . calcium acetate  667 mg Oral TID WC  . cefTRIAXone (ROCEPHIN)  IV  2 g Intravenous Q24H  . docusate sodium  100 mg Oral BID  . feeding supplement (ENSURE ENLIVE)  237 mL Oral BID BM  . multivitamin  1 tablet Oral QHS

## 2015-06-17 NOTE — Progress Notes (Signed)
[facility] PROGRESS NOTE  Jeff Parks ZOX:[id number] DOB: December 26, 1929 DOA: 06/13/2015 PCP: No primary care provider on file.  Assessment/Plan:   AKI (acute kidney injury) [facility]) - Nephrology on board and managing - Renal failure with BL hydronephrosis on [place] -Currently requiring dialysis  -Will follow recommendations and further instructions    Urinary retention - foley to be irrigated q shift - As per urology recommendation patient will need TURP as well as cystoscopy for bladder stone as well as BPH in future once he is clinically more stable.    Anorexia -A slightly improved -Continue feeding supplements and encouraged patient to take by mouth     sepsis secondary to UTI/Leukocytosis -sepsis was present on admission  -Sepsis features now improving (no dysuria, improvement on his heart rate and afebrile) -Wbc's still elevated; concerns for initiation of dialysis and stressed demargination  -Continue rocephin -Follow culture data and clinical response    Elevated blood pressure -Stable currently. -Will continue current antihypertensive regimen    Anemia - Appears to be anemia of chronic disease -Iron therapy and epogen as per renal service discretion  -mild episode of blood in stool due to hemorrhoids most likely  -will monitor hemoglobin trend -Most recent hemoglobin 10.1    Bright red blood per rectum -No active evidence of significant bleeding.  -Most likely secondary to hemorrhoids -Will follow hemoglobin treatment   Code Status: full Family Communication: d/c patient directly Disposition Plan: Pending recommendations from specialist; physical therapy has been asked to evaluate patient and provide recommendations.   Consultants:  Nephrology  Radiology  Procedures:  HD  Antibiotics:  Rocephin  HPI/Subjective: Patient denies chest pain, shortness of breath, nausea and vomiting. Is now actively moving his bowels and has not had any further episode  of blood in his stools. Patient is more alert and oriented 2.  Objective: Filed Vitals:   06/17/15 1457 06/17/15 1500  BP: 140/73 131/67  Pulse: 103 101  Temp:    Resp:      Intake/Output Summary (Last 24 hours) at 06/17/15 1655 Last data filed at 06/17/15 1417  Gross per 24 hour  Intake   1110 ml  Output     10 ml  Net   1100 ml   Filed Weights   06/13/15 2025 06/16/15 1344 06/17/15 1442  Weight: 81.5 kg (179 lb 10.8 oz) 83.4 kg (183 lb 13.8 oz) 84.7 kg (186 lb 11.7 oz)    Exam:   General:  Pt in nad, more oriented and able to follow commands. Still with off-balance working with physical therapy, but more active.  Cardiovascular: rrr, no mrg  Respiratory: cta bl, no wheezes, no crackles on exam  Abdomen: soft, no guarding  Musculoskeletal: no cyanosis on limited exam   Data Reviewed: Basic Metabolic Panel:  Recent Labs Lab 06/13/15 1116 06/14/15 0250 06/15/15 0437 06/17/15 1511  NA 140 143 140 140  K 5.0 4.4 4.0 4.2  CL 101 106 104 97*  CO2 15* 15* 16* 21*  GLUCOSE 110* 127* 171* 150*  BUN 160* 151* 162* 132*  CREATININE 14.21* 12.57* 12.50* 13.52*  CALCIUM 8.8* 8.3* 7.8* 7.8*  PHOS  --   --  5.8* 7.1*   Liver Function Tests:  Recent Labs Lab 06/13/15 1116 06/15/15 0437 06/17/15 1511  AST 40 29  --   ALT 62 43  --   ALKPHOS 203* 133*  --   BILITOT 1.3* 0.9  --   PROT 8.0 6.6  --   ALBUMIN 2.6*  2.0* 1.7*    Recent Labs Lab 06/13/15 1116 06/15/15 1152  LIPASE 54* 49   CBC:  Recent Labs Lab 06/13/15 1116 06/14/15 0250 06/15/15 0437 06/17/15 0722 06/17/15 1511  WBC 19.3* 20.4* 22.3* 29.3* 30.9*  NEUTROABS  --   --   --   --  27.8*  HGB 11.7* 11.0* 10.1* 10.2* 10.1*  HCT 34.4* 31.8* 28.6* 29.1* 29.7*  MCV 82.1 81.5 81.0 81.3 81.6  PLT 383 390 371 302 307    CBG: No results for input(s): GLUCAP in the last 168 hours.  Recent Results (from the past 240 hour(s))  Culture, blood (x 2)     Status: None (Preliminary result)    Collection Time: 06/13/15 11:03 PM  Result Value Ref Range Status   Specimen Description BLOOD LEFT ARM  Final   Special Requests BOTTLES DRAWN AEROBIC AND ANAEROBIC 5 CC  Final   Culture   Final    NO GROWTH 3 DAYS Performed at [facility]    Report Status PENDING  Incomplete  Culture, blood (x 2)     Status: None (Preliminary result)   Collection Time: 06/13/15 11:04 PM  Result Value Ref Range Status   Specimen Description BLOOD RIGHT HAND  Final   Special Requests BOTTLES DRAWN AEROBIC AND ANAEROBIC 5 CC  Final   Culture   Final    NO GROWTH 3 DAYS Performed at [facility]    Report Status PENDING  Incomplete  Culture, Urine     Status: None   Collection Time: 06/14/15  7:41 AM  Result Value Ref Range Status   Specimen Description URINE, RANDOM  Final   Special Requests NONE  Final   Culture   Final    NO GROWTH 1 DAY Performed at [facility]    Report Status 06/15/2015 FINAL  Final     Studies: Ir Fluoro Guide Cv Line Right  06/16/2015  INDICATION: Renal failure and needs hemodialysis. Plan for placement of non tunneled catheter due to leukocytosis and possible urinary tract infection. EXAM: FLUOROSCOPIC AND ULTRASOUND GUIDED PLACEMENT OF A NON TUNNELED DIALYSIS CATHETER Physician: Rachelle Hora. Henn, MD MEDICATIONS: None ANESTHESIA/SEDATION: None FLUOROSCOPY TIME:  Fluoroscopy Time: 24 seconds, 1.3 mGy COMPLICATIONS: None immediate. PROCEDURE: Informed consent was obtained for placement of a tunneled dialysis catheter. The patient was placed supine on the interventional table. Ultrasound confirmed a patent right internal jugular vein. Ultrasound images were obtained for documentation. The right side of the neck was prepped and draped in a sterile fashion. The right side of the neck was anesthetized with 1% lidocaine. Maximal barrier sterile technique was utilized including caps, mask, sterile gowns, sterile gloves, sterile drape, hand hygiene and skin  antiseptic. A small incision was made with #11 blade scalpel. A 21 gauge needle directed into the right internal jugular vein with ultrasound guidance. A micropuncture dilator set was placed. J wire was advanced into the central veins. A 20 cm Trialysis catheter was advanced over the wire and positioned at the superior cavoatrial junction. Both dialysis lumens were found to aspirate and flush well. The proper amount of heparin was placed in both lumens. The central lumen aspirated and flushed well. Central lumen was flushed with normal saline. The catheter was secured to the skin using Prolene suture. Fluoroscopic and ultrasound images were taken and saved for documentation. FINDINGS: Catheter tip at the superior cavoatrial junction. IMPRESSION: Successful placement of a non tunneled dialysis catheter using ultrasound and fluoroscopic guidance. Electronically Signed  By: Richarda Overlie M.D.   On: 06/16/2015 12:32   Ir US Guide Vasc Access Right  06/16/2015  INDICATION: Renal failure and needs hemodialysis. Plan for placement of non tunneled catheter due to leukocytosis and possible urinary tract infection. EXAM: FLUOROSCOPIC AND ULTRASOUND GUIDED PLACEMENT OF A NON TUNNELED DIALYSIS CATHETER Physician: Rachelle Hora. Henn, MD MEDICATIONS: None ANESTHESIA/SEDATION: None FLUOROSCOPY TIME:  Fluoroscopy Time: 24 seconds, 1.3 mGy COMPLICATIONS: None immediate. PROCEDURE: Informed consent was obtained for placement of a tunneled dialysis catheter. The patient was placed supine on the interventional table. Ultrasound confirmed a patent right internal jugular vein. Ultrasound images were obtained for documentation. The right side of the neck was prepped and draped in a sterile fashion. The right side of the neck was anesthetized with 1% lidocaine. Maximal barrier sterile technique was utilized including caps, mask, sterile gowns, sterile gloves, sterile drape, hand hygiene and skin antiseptic. A small incision was made with #11  blade scalpel. A 21 gauge needle directed into the right internal jugular vein with ultrasound guidance. A micropuncture dilator set was placed. J wire was advanced into the central veins. A 20 cm Trialysis catheter was advanced over the wire and positioned at the superior cavoatrial junction. Both dialysis lumens were found to aspirate and flush well. The proper amount of heparin was placed in both lumens. The central lumen aspirated and flushed well. Central lumen was flushed with normal saline. The catheter was secured to the skin using Prolene suture. Fluoroscopic and ultrasound images were taken and saved for documentation. FINDINGS: Catheter tip at the superior cavoatrial junction. IMPRESSION: Successful placement of a non tunneled dialysis catheter using ultrasound and fluoroscopic guidance. Electronically Signed   By: Richarda Overlie M.D.   On: 06/16/2015 12:32    Scheduled Meds: . calcium acetate  667 mg Oral TID WC  . cefTRIAXone (ROCEPHIN)  IV  2 g Intravenous Q24H  . docusate sodium  100 mg Oral BID  . feeding supplement (ENSURE ENLIVE)  237 mL Oral BID BM  . multivitamin  1 tablet Oral QHS   Continuous Infusions:    Time spent: 25 minutes  Vassie Loll  [facility] Pager [phone] If 7PM-7AM, please contact night-coverage at [email or web address], password [facility] 06/17/2015, 4:55 PM  LOS: 4 days

## 2015-06-17 NOTE — Progress Notes (Signed)
Pharmacy Antibiotic Note  Jeff Parks is a 82 y.o. male admitted on 06/13/2015 with urinary retention, ARF.   Pharmacy has been consulted for Ceftriaxone dosing for UTI and sepsis coverage.   Day # 5 Ceftriaxone.   Afebrile, WBC up to 29.3. Urine culture negative. Blood cultures negative to date.  Plan:  Continue Ceftriaxone 2gm IV q24hrs. Daily dose due at 6pm.  Follow up final cultures, length of therapy.  Height: 6' (182.9 cm) Weight: 183 lb 13.8 oz (83.4 kg) (Standing scale) IBW/kg (Calculated) : 77.6  Temp (24hrs), Avg:98.5 F (36.9 C), Min:97.8 F (36.6 C), Max:98.8 F (37.1 C)   Recent Labs Lab 06/13/15 1116 06/13/15 2305 06/14/15 0247 06/14/15 0250 06/15/15 0437 06/15/15 1152 06/17/15 0722  WBC 19.3*  --   --  20.4* 22.3*  --  29.3*  CREATININE 14.21*  --   --  12.57* 12.50*  --   --   LATICACIDVEN  --  1.1 0.8  --   --  1.6  --     Estimated Creatinine Clearance: 4.9 mL/min (by C-G formula based on Cr of 12.5).    No Known Allergies  Antimicrobials this admission: Ceftriaxone 2/13 >>  Dose adjustments this admission:  none. Renal adjustment not needed for Ceftriaxone  Microbiology results: 2/13 Blood x 2 - [place] x 2 days so far 2/13 Urine - neg  Dennie Fetters, [place] Pager: [phone] 06/17/2015 1:29 PM

## 2015-06-17 NOTE — Procedures (Signed)
I have personally attended this patient's dialysis session.   HD #2 250/500 3 hours Trialysis cath Well tolerated   Camille Bal, MD [facility] [phone] Pager 06/17/2015, 6:31 PM

## 2015-06-17 NOTE — Progress Notes (Signed)
Physical Therapy Treatment Patient Details Name: Jeff Parks MRN: [id number] DOB: 12/31/1929 Today's Date: 06/17/2015    History of Present Illness Jeff Parks is a 82 y.o. male with no previous medical problems who presents to the ED with complaints of Urinary retention x 3- 4 days. He reports not being able to urinate for the past 12 hours. He began to have fever today as well. He was evaluated in the ED, and a foley catheter was placed in the ED, and his BUN/Cr was elevated, and a Renal US was ordered and was admitted. Dx: Renal failure, urinary retention, Fever, anorexia, anemia, leukocytosis - due to early sepsis, HTN.    PT Comments    Making some progress towards functional goals. Still with difficulty following simple commands, easily distracted. Min assist frequently for balance and walker control with short distance gait; refused to ambulate outside of room and he was quite fatigued at end of short distance today. Patient will continue to benefit from skilled physical therapy services to further improve independence with functional mobility.   Follow Up Recommendations  SNF;Supervision/Assistance - 24 hour (Pending progress - wife wants pt home) She seems more open to possible need for SNF at d/c. We will monitor and progress as tolerated.     Equipment Recommendations  Rolling walker with 5" wheels    Recommendations for Other Services       Precautions / Restrictions Precautions Precautions: Fall Restrictions Weight Bearing Restrictions: No    Mobility  Bed Mobility               General bed mobility comments: sitting in recliner when PT entered room  Transfers Overall transfer level: Needs assistance Equipment used: Rolling walker (2 wheeled) Transfers: Sit to/from Stand Sit to Stand: Min assist         General transfer comment: Min assist for boost and balance. Max VC for hand placement and technique.  Ambulation/Gait Ambulation/Gait assistance:  Min assist Ambulation Distance (Feet): 30 Feet Assistive device: Rolling walker (2 wheeled) Gait Pattern/deviations: Step-through pattern;Decreased stride length;Scissoring;Ataxic;Drifts right/left;Narrow base of support Gait velocity: slow Gait velocity interpretation: <1.8 ft/sec, indicative of risk for recurrent falls General Gait Details: Poor coordination of gait sequencing and use of RW simultaneously requiring frequent min assist for balance and walker control. VC for walker placement and to hold at all times. Posterior loss of balance noted intermittently. No buckling. Refused to step outside of room. Very tired, tremulous at end of distance.   Stairs            Wheelchair Mobility    Modified Rankin (Stroke Patients Only)       Balance                                    Cognition Arousal/Alertness: Awake/alert Behavior During Therapy: Flat affect Overall Cognitive Status: Impaired/Different from baseline Area of Impairment: Orientation;Attention;Following commands;Safety/judgement;Problem solving Orientation Level: Disoriented to;Time;Situation Current Attention Level: Focused   Following Commands: Follows one step commands inconsistently;Follows one step commands with increased time Safety/Judgement: Decreased awareness of safety   Problem Solving: Slow processing;Decreased initiation;Difficulty sequencing;Requires verbal cues;Requires tactile cues      Exercises      General Comments General comments (skin integrity, edema, etc.): Bowel incontinence in chair. Pt cleaned, linens changed in chair.      Pertinent Vitals/Pain Pain Assessment: No/denies pain    Home Living  Prior Function            PT Goals (current goals can now be found in the care plan section) Acute Rehab PT Goals PT Goal Formulation: With family Time For Goal Achievement: 06/29/15 Potential to Achieve Goals: Good Progress towards PT  goals: Progressing toward goals    Frequency  Min 3X/week    PT Plan Current plan remains appropriate    Co-evaluation             End of Session Equipment Utilized During Treatment: Gait belt Activity Tolerance: Patient limited by fatigue Patient left: with call bell/phone within reach;in chair;with family/visitor present (MD in room)     Time: 1610-9604 PT Time Calculation (min) (ACUTE ONLY): 22 min  Charges:  $Gait Training: 8-22 mins                    G Codes:      Berton Mount 06-20-2015, 10:23 AM  Charlsie Merles, PT [phone]

## 2015-06-18 ENCOUNTER — Other Ambulatory Visit (HOSPITAL_COMMUNITY): Payer: Medicare Other

## 2015-06-18 ENCOUNTER — Inpatient Hospital Stay (HOSPITAL_COMMUNITY): Payer: Medicare Other

## 2015-06-18 LAB — RENAL FUNCTION PANEL
ALBUMIN: 1.6 g/dL — AB (ref 3.5–5.0)
Anion gap: 13 (ref 5–15)
BUN: 72 mg/dL — ABNORMAL HIGH (ref 6–20)
CHLORIDE: 96 mmol/L — AB (ref 101–111)
CO2: 26 mmol/L (ref 22–32)
Calcium: 7.3 mg/dL — ABNORMAL LOW (ref 8.9–10.3)
Creatinine, Ser: 9.44 mg/dL — ABNORMAL HIGH (ref 0.61–1.24)
GFR calc Af Amer: 5 mL/min — ABNORMAL LOW (ref >60)
GFR calc non Af Amer: 4 mL/min — ABNORMAL LOW (ref >60)
GLUCOSE: 151 mg/dL — AB (ref 65–99)
PHOSPHORUS: 5.3 mg/dL — AB (ref 2.5–4.6)
POTASSIUM: 3.5 mmol/L (ref 3.5–5.1)
Sodium: 135 mmol/L (ref 135–145)

## 2015-06-18 LAB — CBC
HEMATOCRIT: 29.1 % — AB (ref 39.0–52.0)
HEMOGLOBIN: 9.5 g/dL — AB (ref 13.0–17.0)
MCH: 27.1 pg (ref 26.0–34.0)
MCHC: 32.6 g/dL (ref 30.0–36.0)
MCV: 82.9 fL (ref 78.0–100.0)
Platelets: 243 10*3/uL (ref 150–400)
RBC: 3.51 MIL/uL — ABNORMAL LOW (ref 4.22–5.81)
RDW: 13.8 % (ref 11.5–15.5)
WBC: 26.2 10*3/uL — ABNORMAL HIGH (ref 4.0–10.5)

## 2015-06-18 MED ORDER — SODIUM CHLORIDE 0.9% FLUSH
10.0000 mL | INTRAVENOUS | Status: DC | PRN
  Administered 2015-06-21 – 2015-06-24 (×4): 10 mL
  Filled 2015-06-18 (×4): qty 40

## 2015-06-18 NOTE — Progress Notes (Signed)
[facility] PROGRESS NOTE  Ademide Schaberg ZOX:[id number] DOB: [birth year] DOA: 06/13/2015 PCP: No primary care provider on file.  Assessment/Plan:  Principal Problem:   AKI (acute kidney injury) [facility]) - Nephrology on board and managing - Renal failure with BL hydronephrosis on [place]  Active Problems:   Urinary retention - foley to be irrigated q shift - As per urology recommendation patient will need TURP as well as cystoscopy for bladder stone as well as BPH in future once he is clinically more stable.    Anorexia   Leukocytosis - suspecting uti. Continue rocephin    Elevated blood pressure - we have had fluctuations in BP's    Anemia - no active bleeding reported.    Bright red blood per rectum - No active evidence of significant bleeding. Will check H&H every 12 hours. - hgb stable   Code Status: full Family Communication: d/c patient directly Disposition Plan: Pending recommendations from specialist   Consultants:  Nephrology  Radiology  Procedures:  HD  Antibiotics:  Rocephin  HPI/Subjective: Pt has no new complaints reported to me today  Objective: Filed Vitals:   06/18/15 0820 06/18/15 1517  BP: 147/68   Pulse: 111   Temp: 98.4 F (36.9 C) 99.1 F (37.3 C)  Resp: 18     Intake/Output Summary (Last 24 hours) at 06/18/15 1539 Last data filed at 06/18/15 1047  Gross per 24 hour  Intake    530 ml  Output    100 ml  Net    430 ml   Filed Weights   06/16/15 1344 06/17/15 1442 06/17/15 1820  Weight: 83.4 kg (183 lb 13.8 oz) 84.7 kg (186 lb 11.7 oz) 84.7 kg (186 lb 11.7 oz)    Exam:   General:  Pt in nad, alert and awake  Cardiovascular: rrr, no mrg  Respiratory: cta bl, no wheezes  Abdomen: soft, no guarding  Musculoskeletal: no cyanosis on limited exam   Data Reviewed: Basic Metabolic Panel:  Recent Labs Lab 06/13/15 1116 06/14/15 0250 06/15/15 0437 06/17/15 1511 06/18/15 1119  NA 140 143 140 140 135  K 5.0 4.4 4.0  4.2 3.5  CL 101 106 104 97* 96*  CO2 15* 15* 16* 21* 26  GLUCOSE 110* 127* 171* 150* 151*  BUN 160* 151* 162* 132* 72*  CREATININE 14.21* 12.57* 12.50* 13.52* 9.44*  CALCIUM 8.8* 8.3* 7.8* 7.8* 7.3*  PHOS  --   --  5.8* 7.1* 5.3*   Liver Function Tests:  Recent Labs Lab 06/13/15 1116 06/15/15 0437 06/17/15 1511 06/18/15 1119  AST 40 29  --   --   ALT 62 43  --   --   ALKPHOS 203* 133*  --   --   BILITOT 1.3* 0.9  --   --   PROT 8.0 6.6  --   --   ALBUMIN 2.6* 2.0* 1.7* 1.6*    Recent Labs Lab 06/13/15 1116 06/15/15 1152  LIPASE 54* 49   No results for input(s): AMMONIA in the last 168 hours. CBC:  Recent Labs Lab 06/14/15 0250 06/15/15 0437 06/17/15 0722 06/17/15 1511 06/18/15 1119  WBC 20.4* 22.3* 29.3* 30.9* 26.2*  NEUTROABS  --   --   --  27.8*  --   HGB 11.0* 10.1* 10.2* 10.1* 9.5*  HCT 31.8* 28.6* 29.1* 29.7* 29.1*  MCV 81.5 81.0 81.3 81.6 82.9  PLT 390 371 302 307 243   Cardiac Enzymes: No results for input(s): CKTOTAL, CKMB, CKMBINDEX, TROPONINI in the last 168  hours. BNP (last 3 results) No results for input(s): BNP in the last 8760 hours.  ProBNP (last 3 results) No results for input(s): PROBNP in the last 8760 hours.  CBG: No results for input(s): GLUCAP in the last 168 hours.  Recent Results (from the past 240 hour(s))  Culture, blood (x 2)     Status: None (Preliminary result)   Collection Time: 06/13/15 11:03 PM  Result Value Ref Range Status   Specimen Description BLOOD LEFT ARM  Final   Special Requests BOTTLES DRAWN AEROBIC AND ANAEROBIC 5 CC  Final   Culture   Final    NO GROWTH 4 DAYS Performed at [facility]    Report Status PENDING  Incomplete  Culture, blood (x 2)     Status: None (Preliminary result)   Collection Time: 06/13/15 11:04 PM  Result Value Ref Range Status   Specimen Description BLOOD RIGHT HAND  Final   Special Requests BOTTLES DRAWN AEROBIC AND ANAEROBIC 5 CC  Final   Culture   Final    NO GROWTH 4  DAYS Performed at [facility]    Report Status PENDING  Incomplete  Culture, Urine     Status: None   Collection Time: 06/14/15  7:41 AM  Result Value Ref Range Status   Specimen Description URINE, RANDOM  Final   Special Requests NONE  Final   Culture   Final    NO GROWTH 1 DAY Performed at [facility]    Report Status 06/15/2015 FINAL  Final     Studies: US Renal  06/18/2015  CLINICAL DATA:  Acute renal failure. Follow-up bilateral hydronephrosis. EXAM: RENAL / URINARY TRACT ULTRASOUND COMPLETE COMPARISON:  06/13/2015. FINDINGS: Right Kidney: Length: 12.6 cm. Normal echogenicity. Stable dilatation of the collecting system and previously described cysts. Left Kidney: Length: 12.7 cm. Normal echogenicity. Stable dilatation of the collecting system. Stable large complex cyst. Bladder: Interval Foley catheter.  Stable debris and multiple calculi. IMPRESSION: 1. Stable mild bilateral hydronephrosis. 2. Stable large, complex left renal cyst concerning for the possibility of a cystic neoplasm. 3. Stable debris and calculi in the bladder. Electronically Signed   By: Beckie Salts M.D.   On: 06/18/2015 14:51    Scheduled Meds: . calcium acetate  667 mg Oral TID WC  . cefTRIAXone (ROCEPHIN)  IV  2 g Intravenous Q24H  . docusate sodium  100 mg Oral BID  . feeding supplement (ENSURE ENLIVE)  237 mL Oral BID BM  . multivitamin  1 tablet Oral QHS   Continuous Infusions:    Time spent: > 35 minutes  Penny Pia  [facility] Pager [phone] If 7PM-7AM, please contact night-coverage at [email or web address], password [facility] 06/18/2015, 3:39 PM  LOS: 5 days

## 2015-06-18 NOTE — Progress Notes (Signed)
[facility] Rounding Note  Subjective:  Lethargic this am =>Given recent pain med for foley discomfort per wife in room HD#2 yesterday For HD again today Using Temp R IJ cath Per wife ate somewhat better this am but somewhat restless last night  Objective Vital signs in last 24 hours: Filed Vitals:   06/17/15 1907 06/17/15 2129 06/18/15 0635 06/18/15 0820  BP: 156/65 129/63 139/66 147/68  Pulse: 109 117 109 111  Temp: 98.1 F (36.7 C) 98 F (36.7 C) 98.1 F (36.7 C) 98.4 F (36.9 C)  TempSrc: Oral Oral Oral Oral  Resp: '17 18 19 18  ' Height:      Weight:      SpO2: 99% 99% 97% 93%   Weight change: 1.3 kg (2 lb 13.9 oz)  Physical Exam: General: Thin Elderly [name] /lethargic awoken to touch stimuli  Has no cos ,will move all extrem to requests though States HD was OK yest Heart: RRR no m, r, g /No S3 Lungs: CTA bilat Abdomen: Soft and not tender Extremities: No Pedal edema Dialysis Access: Right jugular trialysis catheter  Problem/Plan: 1. Renal failure with bilateral hydronephrosis on [place] -Was on NSAIDS/ initially 2L urine in the bladder, minimal UOP since foley placed. Creatinine came down a little the first day but unchanged yest labs pre hd 2/15 at Cr 13.52 BUN 132 K 4.2, CO2 21 and with uremic symptoms= lethargy  Hard to trell thus am sec pain med/am  labs  still pend and [place] pend  / PTA - in the best of all worlds this would have been obstruction plus NSAIDS but he is not improving with foley/IVF. Transferred to [facility] for HD as we continue to investigate causes for his renal failure. Kidneys are normal size on [place] and not echogenic - if serologies yield no dx a renal biopsy, even at his age, is not out of the question. Temporary HD catheter placed 06/16/15 in IR with HD#2 yesterday / today"s  re-ultrasound pending  to see of hydro has resolved with bladder decompression. For HD#3 today  2. Metabolic acidosis-improving. Bicarb drip stopped when HD started.  3. Leukocytosis  -20.4>22.3> 29.3>30.9 yest . Pend today today/ possible UTI, but urine culture no growth final./ BC 2/14= No growth so far / AFEBRILE/ On rocephin Source not clear  4. Mild anemia - Hgb 10.1 labs  yest /pending  today / possibly due to CKD. Fe stores adequate 37% ferritin 987 sat B12 1686 No ESA requirement yet  5. Bladder sludge/bladder stones - irrigate foley QShift (to make sure lack of urine output isn't related to bladder sludge occluding foley)/ RX per urology  6. Large complex renal cyst - cannot exclude malignancy. Not the cause of his RF but will need eval at some point. / Urology RX   7. Elevated alk phos and bili. Gallstones on [place]. Labs normalized  8 MBD - check iPTH - P 5.8>7.1 yest with  Start  of binder  phoslo - last Ca 7.8  9 Nutrition - Alb 2.0 >1.8- Ensure / renal diet - multivit    Herson Haber, PA-C [facility] Beeper [phone] 06/18/2015,11:39 AM  LOS: 5 days   I have seen and examined this patient and agree with plan and assessment in the above note. For HD#3 today. Plan renal US to see if hydro resolved (had 2L urine in bladder initially "without discomfort" but has made little urine since despite irrigation of foley. I am not clear why WBC is now nearly 31,000.  Urine culture neg, initial BC's neg.  Chrishawn Boley B,MD 06/18/2015 4:06 PM  Labs: Basic Metabolic Panel:  Recent Labs Lab 06/14/15 0250 06/15/15 0437 06/17/15 1511  NA 143 140 140  K 4.4 4.0 4.2  CL 106 104 97*  CO2 15* 16* 21*  GLUCOSE 127* 171* 150*  BUN 151* 162* 132*  CREATININE 12.57* 12.50* 13.52*  CALCIUM 8.3* 7.8* 7.8*  PHOS  --  5.8* 7.1*   Liver Function Tests:  Recent Labs Lab 06/13/15 1116 06/15/15 0437 06/17/15 1511  AST 40 29  --   ALT 62 43  --   ALKPHOS 203* 133*  --   BILITOT 1.3* 0.9  --   PROT 8.0 6.6  --   ALBUMIN 2.6* 2.0* 1.7*    Recent Labs Lab 06/13/15 1116 06/15/15 1152  LIPASE 54* 49   No results for  input(s): AMMONIA in the last 168 hours. CBC:  Recent Labs Lab 06/13/15 1116 06/14/15 0250 06/15/15 0437 06/17/15 0722 06/17/15 1511  WBC 19.3* 20.4* 22.3* 29.3* 30.9*  NEUTROABS  --   --   --   --  27.8*  HGB 11.7* 11.0* 10.1* 10.2* 10.1*  HCT 34.4* 31.8* 28.6* 29.1* 29.7*  MCV 82.1 81.5 81.0 81.3 81.6  PLT 383 390 371 302 307   Medications:   . calcium acetate  667 mg Oral TID WC  . cefTRIAXone (ROCEPHIN)  IV  2 g Intravenous Q24H  . docusate sodium  100 mg Oral BID  . feeding supplement (ENSURE ENLIVE)  237 mL Oral BID BM  . multivitamin  1 tablet Oral QHS

## 2015-06-19 ENCOUNTER — Inpatient Hospital Stay (HOSPITAL_COMMUNITY): Payer: Medicare Other

## 2015-06-19 LAB — RENAL FUNCTION PANEL
ALBUMIN: 1.6 g/dL — AB (ref 3.5–5.0)
ANION GAP: 14 (ref 5–15)
BUN: 36 mg/dL — AB (ref 6–20)
CHLORIDE: 99 mmol/L — AB (ref 101–111)
CO2: 26 mmol/L (ref 22–32)
Calcium: 7.6 mg/dL — ABNORMAL LOW (ref 8.9–10.3)
Creatinine, Ser: 6.58 mg/dL — ABNORMAL HIGH (ref 0.61–1.24)
GFR calc Af Amer: 8 mL/min — ABNORMAL LOW (ref >60)
GFR calc non Af Amer: 7 mL/min — ABNORMAL LOW (ref >60)
GLUCOSE: 110 mg/dL — AB (ref 65–99)
PHOSPHORUS: 3.8 mg/dL (ref 2.5–4.6)
POTASSIUM: 4 mmol/L (ref 3.5–5.1)
Sodium: 139 mmol/L (ref 135–145)

## 2015-06-19 LAB — COMPREHENSIVE METABOLIC PANEL
ALBUMIN: 1.5 g/dL — AB (ref 3.5–5.0)
ALT: 32 U/L (ref 17–63)
ANION GAP: 14 (ref 5–15)
AST: 38 U/L (ref 15–41)
Alkaline Phosphatase: 90 U/L (ref 38–126)
BUN: 41 mg/dL — ABNORMAL HIGH (ref 6–20)
CO2: 25 mmol/L (ref 22–32)
Calcium: 7.7 mg/dL — ABNORMAL LOW (ref 8.9–10.3)
Chloride: 98 mmol/L — ABNORMAL LOW (ref 101–111)
Creatinine, Ser: 7.43 mg/dL — ABNORMAL HIGH (ref 0.61–1.24)
GFR calc Af Amer: 7 mL/min — ABNORMAL LOW (ref >60)
GFR calc non Af Amer: 6 mL/min — ABNORMAL LOW (ref >60)
Glucose, Bld: 186 mg/dL — ABNORMAL HIGH (ref 65–99)
POTASSIUM: 3.9 mmol/L (ref 3.5–5.1)
Sodium: 137 mmol/L (ref 135–145)
TOTAL PROTEIN: 6.3 g/dL — AB (ref 6.5–8.1)
Total Bilirubin: 0.3 mg/dL (ref 0.3–1.2)

## 2015-06-19 LAB — CULTURE, BLOOD (ROUTINE X 2)
Culture: NO GROWTH
Culture: NO GROWTH

## 2015-06-19 LAB — CBC
HEMATOCRIT: 31.4 % — AB (ref 39.0–52.0)
HEMOGLOBIN: 10.2 g/dL — AB (ref 13.0–17.0)
MCH: 27.3 pg (ref 26.0–34.0)
MCHC: 32.5 g/dL (ref 30.0–36.0)
MCV: 84 fL (ref 78.0–100.0)
PLATELETS: 199 10*3/uL (ref 150–400)
RBC: 3.74 MIL/uL — AB (ref 4.22–5.81)
RDW: 13.9 % (ref 11.5–15.5)
WBC: 25.6 10*3/uL — AB (ref 4.0–10.5)

## 2015-06-19 LAB — AMMONIA: AMMONIA: 45 umol/L — AB (ref 9–35)

## 2015-06-19 NOTE — Progress Notes (Signed)
[facility] PROGRESS NOTE  Jeff Parks ZOX:[id number] DOB: 02/21/1930 DOA: 06/13/2015 PCP: No primary care provider on file.  Assessment/Plan:  Principal Problem: Altered mental status - Patient is more somnolent, nephrology suspects there may be something else going on. As such will work up for routine altered mental status. Also obtain CT of head. Medication list reviewed     AKI (acute kidney injury) [facility]) - Nephrology on board and managing  Active Problems:   Urinary retention - Per Nephrology recommendations will reach out to Urology again given persistent hydronephrosis despite foley placement.     Anorexia   Leukocytosis - suspecting uti. Continue rocephin    Elevated blood pressure - we have had fluctuations in BP's    Anemia - no active bleeding reported.    Bright red blood per rectum - No active evidence of significant bleeding.  - hgb stable   Code Status: full Family Communication: d/c patient directly Disposition Plan: Pending recommendations from specialist   Consultants:  Nephrology  Radiology  Procedures:  HD  Antibiotics:  Rocephin  HPI/Subjective: Pt has no new complaints reported to me today.  Objective: Filed Vitals:   06/19/15 0502 06/19/15 1000  BP: 116/69 130/59  Pulse: 81 114  Temp: 98.2 F (36.8 C) 99 F (37.2 C)  Resp: 20 20    Intake/Output Summary (Last 24 hours) at 06/19/15 1538 Last data filed at 06/19/15 0855  Gross per 24 hour  Intake    140 ml  Output    200 ml  Net    -60 ml   Filed Weights   06/17/15 1820 06/18/15 2021 06/19/15 0000  Weight: 84.7 kg (186 lb 11.7 oz) 78.9 kg (173 lb 15.1 oz) 78.9 kg (173 lb 15.1 oz)    Exam:   General:  Pt in nad, somnolent  Cardiovascular: rrr, no mrg  Respiratory: cta bl, no wheezes  Abdomen: soft, no guarding  Musculoskeletal: no cyanosis on limited exam   Data Reviewed: Basic Metabolic Panel:  Recent Labs Lab 06/14/15 0250 06/15/15 0437  06/17/15 1511 06/18/15 1119 06/19/15 1015  NA 143 140 140 135 139  K 4.4 4.0 4.2 3.5 4.0  CL 106 104 97* 96* 99*  CO2 15* 16* 21* 26 26  GLUCOSE 127* 171* 150* 151* 110*  BUN 151* 162* 132* 72* 36*  CREATININE 12.57* 12.50* 13.52* 9.44* 6.58*  CALCIUM 8.3* 7.8* 7.8* 7.3* 7.6*  PHOS  --  5.8* 7.1* 5.3* 3.8   Liver Function Tests:  Recent Labs Lab 06/13/15 1116 06/15/15 0437 06/17/15 1511 06/18/15 1119 06/19/15 1015  AST 40 29  --   --   --   ALT 62 43  --   --   --   ALKPHOS 203* 133*  --   --   --   BILITOT 1.3* 0.9  --   --   --   PROT 8.0 6.6  --   --   --   ALBUMIN 2.6* 2.0* 1.7* 1.6* 1.6*    Recent Labs Lab 06/13/15 1116 06/15/15 1152  LIPASE 54* 49   No results for input(s): AMMONIA in the last 168 hours. CBC:  Recent Labs Lab 06/15/15 0437 06/17/15 0722 06/17/15 1511 06/18/15 1119 06/19/15 0445  WBC 22.3* 29.3* 30.9* 26.2* 25.6*  NEUTROABS  --   --  27.8*  --   --   HGB 10.1* 10.2* 10.1* 9.5* 10.2*  HCT 28.6* 29.1* 29.7* 29.1* 31.4*  MCV 81.0 81.3 81.6 82.9 84.0  PLT 371  302 307 243 199   Cardiac Enzymes: No results for input(s): CKTOTAL, CKMB, CKMBINDEX, TROPONINI in the last 168 hours. BNP (last 3 results) No results for input(s): BNP in the last 8760 hours.  ProBNP (last 3 results) No results for input(s): PROBNP in the last 8760 hours.  CBG: No results for input(s): GLUCAP in the last 168 hours.  Recent Results (from the past 240 hour(s))  Culture, blood (x 2)     Status: None (Preliminary result)   Collection Time: 06/13/15 11:03 PM  Result Value Ref Range Status   Specimen Description BLOOD LEFT ARM  Final   Special Requests BOTTLES DRAWN AEROBIC AND ANAEROBIC 5 CC  Final   Culture   Final    NO GROWTH 4 DAYS Performed at [facility]    Report Status PENDING  Incomplete  Culture, blood (x 2)     Status: None (Preliminary result)   Collection Time: 06/13/15 11:04 PM  Result Value Ref Range Status   Specimen Description  BLOOD RIGHT HAND  Final   Special Requests BOTTLES DRAWN AEROBIC AND ANAEROBIC 5 CC  Final   Culture   Final    NO GROWTH 4 DAYS Performed at [facility]    Report Status PENDING  Incomplete  Culture, Urine     Status: None   Collection Time: 06/14/15  7:41 AM  Result Value Ref Range Status   Specimen Description URINE, RANDOM  Final   Special Requests NONE  Final   Culture   Final    NO GROWTH 1 DAY Performed at [facility]    Report Status 06/15/2015 FINAL  Final     Studies: Ct Abdomen Pelvis Wo Contrast  06/19/2015  CLINICAL DATA:  Bilateral hydronephrosis on recent ultrasounds. Renal insufficiency. Abdominal pain. Bloody stools. EXAM: CT ABDOMEN AND PELVIS WITHOUT CONTRAST TECHNIQUE: Multidetector CT imaging of the abdomen and pelvis was performed following the standard protocol without IV contrast. COMPARISON:  Renal ultrasound obtained yesterday FINDINGS: The examination is limited by streak artifact produced by the patient's arms. Lower chest: Small to moderate-sized left pleural effusion and small right pleural effusion. Bilateral lower lobe atelectasis, greater on the left. Hepatobiliary: Large number of small gallstones in the gallbladder. The largest measures 5 mm. No gallbladder wall thickening or pericholecystic fluid. Four left lobe liver cysts. There is also a 3.8 x 2.7 cm oval, oval area of low density in the anterior segment of the right lobe of the liver measuring 26 Hounsfield units in density on image 10. Pancreas: No mass or inflammatory process identified on this un-enhanced exam. Spleen: Within normal limits in size. Adrenals/Urinary Tract: Normal appearing right adrenal gland. The left adrenal gland is poorly visualized due to adjacent soft tissue stranding. No gross left adrenal abnormality seen. Again demonstrated is a large upper pole left renal cyst with diffuse, irregular wall thickening and a thin internal septation. This measures 13.2 cm in maximum  diameter on image number 25 of series 2. There is surrounding soft tissue stranding. A smaller lower pole left renal cyst is noted. A large lower pole right renal cyst is demonstrated with a more simple appearance, measuring 12.1 cm in maximum diameter. There is a small linear calcification along the wall of this cyst laterally. There are 2 additional smaller right renal cysts. Mild to moderate dilatation of both renal collecting systems. Moderate dilatation of the left ureter to the ureterovesical junction. Mild to moderate dilatation of the right ureter to the level  of the ureterovesical junction. No obstructing stones seen at those locations. There are multiple bladder calculi measuring up to 1.2 cm in maximum diameter each. There is also air in the urinary bladder and mild diffuse bladder wall thickening. A Foley catheter is in place. Stomach/Bowel: Multiple colonic diverticula without evidence of diverticulitis. No evidence of appendicitis. No gastric or small bowel abnormalities. Vascular/Lymphatic: Atheromatous arterial calcifications. No enlarged lymph nodes. Reproductive: Moderately enlarged prostate gland. Other: Small supraumbilical hernia containing fat. Musculoskeletal: Lumbar and lower thoracic spine degenerative changes. Mild right hip degenerative changes. Minimal left hip degenerative changes. IMPRESSION: 1. Limited examination due to the lack of intravenous and oral contrast and due to streak artifacts produced by the patient's arms. 2. 13.2 cm complex left renal cyst with surrounding soft tissue stranding. This could represent an infected cyst. A cystic neoplasm is also possible. 3. Followup 0.1 cm minimally complex right renal cyst. 4. Mild to moderate bilateral hydronephrosis and bilateral hydroureter wrist new levels of the ureterovesical junctions with no obstructing calculus or mass seen. 5. Multiple bladder calculi. 6. Moderately enlarged prostate gland. 7. Mild diffuse bladder wall  thickening, compatible with chronic bladder outlet obstruction. 8. Bilateral pleural effusions and bilateral lower lobe atelectasis. 9. Cholelithiasis. 10. 3.8 cm right lobe liver mass. This may represent a complicated cyst or solid mass. 11. Small supraumbilical hernia containing fat. Electronically Signed   By: Beckie Salts M.D.   On: 06/19/2015 13:20   Dg Chest 2 View  06/19/2015  CLINICAL DATA:  Leukocytosis.  Abdominal pain. EXAM: CHEST  2 VIEW COMPARISON:  06/13/2015. FINDINGS: Poor inspiration. No gross change in a normal sized heart. Mildly increased left basilar atelectasis. Interval right basilar atelectasis. Thoracic spine degenerative changes. Interval right jugular catheter with its tip in the inferior right atrium. No pneumothorax. IMPRESSION: 1. Right jugular catheter tip in the inferior right atrium. This could be retracted 6.5 cm to place in the superior vena cava. 2. Mildly increased left basilar atelectasis with interval mild to moderate right basilar atelectasis. Electronically Signed   By: Beckie Salts M.D.   On: 06/19/2015 13:28   US Renal  06/18/2015  CLINICAL DATA:  Acute renal failure. Follow-up bilateral hydronephrosis. EXAM: RENAL / URINARY TRACT ULTRASOUND COMPLETE COMPARISON:  06/13/2015. FINDINGS: Right Kidney: Length: 12.6 cm. Normal echogenicity. Stable dilatation of the collecting system and previously described cysts. Left Kidney: Length: 12.7 cm. Normal echogenicity. Stable dilatation of the collecting system. Stable large complex cyst. Bladder: Interval Foley catheter.  Stable debris and multiple calculi. IMPRESSION: 1. Stable mild bilateral hydronephrosis. 2. Stable large, complex left renal cyst concerning for the possibility of a cystic neoplasm. 3. Stable debris and calculi in the bladder. Electronically Signed   By: Beckie Salts M.D.   On: 06/18/2015 14:51    Scheduled Meds: . calcium acetate  667 mg Oral TID WC  . cefTRIAXone (ROCEPHIN)  IV  2 g Intravenous Q24H   . docusate sodium  100 mg Oral BID  . feeding supplement (ENSURE ENLIVE)  237 mL Oral BID BM  . multivitamin  1 tablet Oral QHS   Continuous Infusions:    Time spent: > 35 minutes  Penny Pia  [facility] Pager [phone] If 7PM-7AM, please contact night-coverage at [email or web address], password [facility] 06/19/2015, 3:38 PM  LOS: 6 days

## 2015-06-19 NOTE — Progress Notes (Signed)
[facility] Rounding Note  Subjective:    Has now had 3 dialysis treatments (last was 2/18) Using Temp R IJ cath (placed 2/16) He is more lethargic today than I have seen  Answers simple questions only but falls asleep Still with minimal output which is bloody from foley  Objective Vital signs in last 24 hours: Filed Vitals:   06/18/15 2330 06/19/15 0000 06/19/15 0502 06/19/15 1000  BP: 154/76 142/79 116/69 130/59  Pulse: 108 109 81 114  Temp:  97.5 F (36.4 C) 98.2 F (36.8 C) 99 F (37.2 C)  TempSrc:  Oral Oral Oral  Resp:  '21 20 20  ' Height:      Weight:  78.9 kg (173 lb 15.1 oz)    SpO2:  100% 95% 96%   Weight change: -5.8 kg (-12 lb 12.6 oz)  Physical Exam: General: Lethargic [name] NAD (more lethargic than I have seen) Lying in bed. Wife not in room Heart: Lungs are clear Rhythm regular S1S2 No S3 Abdomen: Soft  With some right lower quadrant tenderness to deep palp No definite flank pain Extremities: No Pedal edema Dialysis Access: Right jugular trialysis catheter 92/16)  Labs: Basic Metabolic Panel:  Recent Labs Lab 06/17/15 1511 06/18/15 1119 06/19/15 1015  NA 140 135 139  K 4.2 3.5 4.0  CL 97* 96* 99*  CO2 21* 26 26  GLUCOSE 150* 151* 110*  BUN 132* 72* 36*  CREATININE 13.52* 9.44* 6.58*  CALCIUM 7.8* 7.3* 7.6*  PHOS 7.1* 5.3* 3.8   Liver Function Tests:  Recent Labs Lab 06/13/15 1116 06/15/15 0437 06/17/15 1511 06/18/15 1119 06/19/15 1015  AST 40 29  --   --   --   ALT 62 43  --   --   --   ALKPHOS 203* 133*  --   --   --   BILITOT 1.3* 0.9  --   --   --   PROT 8.0 6.6  --   --   --   ALBUMIN 2.6* 2.0* 1.7* 1.6* 1.6*    Recent Labs Lab 06/13/15 1116 06/15/15 1152  LIPASE 54* 49   CBC:  Recent Labs Lab 06/15/15 0437 06/17/15 0722 06/17/15 1511 06/18/15 1119 06/19/15 0445  WBC 22.3* 29.3* 30.9* 26.2* 25.6*  NEUTROABS  --   --  27.8*  --   --   HGB 10.1* 10.2* 10.1* 9.5* 10.2*  HCT 28.6* 29.1* 29.7* 29.1* 31.4*  MCV 81.0 81.3  81.6 82.9 84.0  PLT 371 302 307 243 199   Medications:   . calcium acetate  667 mg Oral TID WC  . cefTRIAXone (ROCEPHIN)  IV  2 g Intravenous Q24H  . docusate sodium  100 mg Oral BID  . feeding supplement (ENSURE ENLIVE)  237 mL Oral BID BM  . multivitamin  1 tablet Oral QHS   SPEP, UPEP, ANCA, C3, C4, anti GBM, hep B and C all negative UPC 1.27 grams  2/19 Renal ultrasound to followup hydro: Length: 12.6 cm. Normal echogenicity. Stable dilatation of the collecting system and previously described cysts. Left Kidney: Length: 12.7 cm. Normal echogenicity. Stable dilatation of the collecting system. Stable large complex cyst  Problem/Plan: 1. Renal failure with bilateral hydronephrosis on initial [place] (and was also taking NSAIDS) Unknown chronicity.  Foley placed and initially 2L urine in the bladder (painless bladder distension) , but minimal UOP since foley placed. Creatinine came down a little the first day butthen remained around 12. Temporary HD catheter placed 06/16/15 in IR  Dialysis initiated and has had 3 treatments.  Labs are much improved. Repeated renal ultrasound and the collecting systems of both kidneys are still dilated - so the foley has apparently not take care of the obstructive process.  Will obtain CT scan to evaluate further for site of obstruction. May need additional help in the short term from Urology. Will look at AM labs to decide on next HD. 2. Encephalopathy - this is worsened for unclear reasons to me. I think needs workup. 3. Metabolic acidosis-resolved 3. Leukocytosis - WBC as high as 30,000 without fever, on rocephin since 2/14, persistent bladder and abd pain. Reculture. CT as above. CXR.  4. Mild anemia - Hgb remains around 10, Fe stores adequate.  5. Bladder sludge/bladder stones - irrigating foley QShift (to make sure lack of urine output isn't related to bladder sludge occluding foley) 6. Large complex renal cyst - cannot exclude malignancy. Not the  cause of his RF but will need eval at some point.. Urology aware 7. Elevated alk phos and bili. Gallstones on [place]. Labs normalized  8.    MBD - check iPTH - P 5.8>7.1 yest with  Start  of binder  phoslo - last Ca 7.8   9     Nutrition - Alb 2.0 >1.8- Ensure / renal diet - multivit    Jeff Maes, MD [facility] [phone] Pager 06/19/2015, 11:49 AM

## 2015-06-20 LAB — CBC
HCT: 29.2 % — ABNORMAL LOW (ref 39.0–52.0)
Hemoglobin: 9.5 g/dL — ABNORMAL LOW (ref 13.0–17.0)
MCH: 27.5 pg (ref 26.0–34.0)
MCHC: 32.5 g/dL (ref 30.0–36.0)
MCV: 84.6 fL (ref 78.0–100.0)
PLATELETS: 191 10*3/uL (ref 150–400)
RBC: 3.45 MIL/uL — AB (ref 4.22–5.81)
RDW: 14.1 % (ref 11.5–15.5)
WBC: 26.8 10*3/uL — AB (ref 4.0–10.5)

## 2015-06-20 LAB — RENAL FUNCTION PANEL
Albumin: 1.5 g/dL — ABNORMAL LOW (ref 3.5–5.0)
Anion gap: 12 (ref 5–15)
BUN: 47 mg/dL — ABNORMAL HIGH (ref 6–20)
CALCIUM: 7.6 mg/dL — AB (ref 8.9–10.3)
CO2: 25 mmol/L (ref 22–32)
CREATININE: 8.63 mg/dL — AB (ref 0.61–1.24)
Chloride: 99 mmol/L — ABNORMAL LOW (ref 101–111)
GFR calc non Af Amer: 5 mL/min — ABNORMAL LOW (ref >60)
GFR, EST AFRICAN AMERICAN: 6 mL/min — AB (ref >60)
Glucose, Bld: 195 mg/dL — ABNORMAL HIGH (ref 65–99)
Phosphorus: 5.1 mg/dL — ABNORMAL HIGH (ref 2.5–4.6)
Potassium: 3.9 mmol/L (ref 3.5–5.1)
SODIUM: 136 mmol/L (ref 135–145)

## 2015-06-20 LAB — RPR, QUANT+TP ABS (REFLEX)
Rapid Plasma Reagin, Quant: 1:4 {titer} — ABNORMAL HIGH
TREPONEMA PALLIDUM AB: POSITIVE — AB

## 2015-06-20 LAB — RPR: RPR: REACTIVE — AB

## 2015-06-20 LAB — HIV ANTIBODY (ROUTINE TESTING W REFLEX): HIV SCREEN 4TH GENERATION: NONREACTIVE

## 2015-06-20 MED ORDER — DARBEPOETIN ALFA 60 MCG/0.3ML IJ SOSY
60.0000 ug | PREFILLED_SYRINGE | INTRAMUSCULAR | Status: DC
  Administered 2015-06-21: 60 ug via INTRAVENOUS
  Filled 2015-06-20 (×2): qty 0.3

## 2015-06-20 MED ORDER — NEPRO/CARBSTEADY PO LIQD
237.0000 mL | Freq: Two times a day (BID) | ORAL | Status: DC
Start: 2015-06-20 — End: 2015-06-28
  Administered 2015-06-20 – 2015-06-28 (×10): 237 mL via ORAL

## 2015-06-20 NOTE — Progress Notes (Addendum)
[facility] PROGRESS NOTE  Jeff Parks ZOX:[id number] DOB: 1929/11/12 DOA: 06/13/2015 PCP: No primary care provider on file.  Assessment/Plan:  Principal Problem: Altered mental status - Patient is more alert.  - CT of head reports no acute intracranial pathology. Medication list reviewed    AKI (acute kidney injury) [facility]) - Nephrology on board and managing  Active Problems:   Urinary retention - Per Nephrology recommendations - d/c urology    Anorexia   Leukocytosis - suspecting uti. Continue rocephin    Elevated blood pressure - we have had fluctuations in BP's    Anemia - no active bleeding reported.    Bright red blood per rectum - No active evidence of significant bleeding.  - hgb stable   Code Status: full Family Communication: d/c patient directly Disposition Plan: Pending recommendations from specialist   Consultants:  Nephrology  Radiology  Urology  Procedures:  HD  Antibiotics:  Rocephin  HPI/Subjective: Pt has no new complaints reported to me today.  Objective: Filed Vitals:   06/19/15 2040 06/20/15 0446  BP: 112/60 128/69  Pulse: 118 116  Temp: 99.4 F (37.4 C) 98.1 F (36.7 C)  Resp: 18 20    Intake/Output Summary (Last 24 hours) at 06/20/15 1743 Last data filed at 06/20/15 0600  Gross per 24 hour  Intake    150 ml  Output      0 ml  Net    150 ml   Filed Weights   06/17/15 1820 06/18/15 2021 06/19/15 0000  Weight: 84.7 kg (186 lb 11.7 oz) 78.9 kg (173 lb 15.1 oz) 78.9 kg (173 lb 15.1 oz)    Exam:   General:  Pt in nad, somnolent  Cardiovascular: rrr, no mrg  Respiratory: cta bl, no wheezes  Abdomen: soft, no guarding  Musculoskeletal: no cyanosis on limited exam   Data Reviewed: Basic Metabolic Panel:  Recent Labs Lab 06/15/15 0437 06/17/15 1511 06/18/15 1119 06/19/15 1015 06/19/15 1530 06/20/15 0520  NA 140 140 135 139 137 136  K 4.0 4.2 3.5 4.0 3.9 3.9  CL 104 97* 96* 99* 98* 99*  CO2 16*  21* 26 26 25 25   GLUCOSE 171* 150* 151* 110* 186* 195*  BUN 162* 132* 72* 36* 41* 47*  CREATININE 12.50* 13.52* 9.44* 6.58* 7.43* 8.63*  CALCIUM 7.8* 7.8* 7.3* 7.6* 7.7* 7.6*  PHOS 5.8* 7.1* 5.3* 3.8  --  5.1*   Liver Function Tests:  Recent Labs Lab 06/15/15 0437 06/17/15 1511 06/18/15 1119 06/19/15 1015 06/19/15 1530 06/20/15 0520  AST 29  --   --   --  38  --   ALT 43  --   --   --  32  --   ALKPHOS 133*  --   --   --  90  --   BILITOT 0.9  --   --   --  0.3  --   PROT 6.6  --   --   --  6.3*  --   ALBUMIN 2.0* 1.7* 1.6* 1.6* 1.5* 1.5*    Recent Labs Lab 06/15/15 1152  LIPASE 49    Recent Labs Lab 06/19/15 1530  AMMONIA 45*   CBC:  Recent Labs Lab 06/17/15 0722 06/17/15 1511 06/18/15 1119 06/19/15 0445 06/20/15 0520  WBC 29.3* 30.9* 26.2* 25.6* 26.8*  NEUTROABS  --  27.8*  --   --   --   HGB 10.2* 10.1* 9.5* 10.2* 9.5*  HCT 29.1* 29.7* 29.1* 31.4* 29.2*  MCV 81.3 81.6  82.9 84.0 84.6  PLT 302 307 243 199 191   Cardiac Enzymes: No results for input(s): CKTOTAL, CKMB, CKMBINDEX, TROPONINI in the last 168 hours. BNP (last 3 results) No results for input(s): BNP in the last 8760 hours.  ProBNP (last 3 results) No results for input(s): PROBNP in the last 8760 hours.  CBG: No results for input(s): GLUCAP in the last 168 hours.  Recent Results (from the past 240 hour(s))  Culture, blood (x 2)     Status: None   Collection Time: 06/13/15 11:03 PM  Result Value Ref Range Status   Specimen Description BLOOD LEFT ARM  Final   Special Requests BOTTLES DRAWN AEROBIC AND ANAEROBIC 5 CC  Final   Culture   Final    NO GROWTH 5 DAYS Performed at [facility]    Report Status 06/19/2015 FINAL  Final  Culture, blood (x 2)     Status: None   Collection Time: 06/13/15 11:04 PM  Result Value Ref Range Status   Specimen Description BLOOD RIGHT HAND  Final   Special Requests BOTTLES DRAWN AEROBIC AND ANAEROBIC 5 CC  Final   Culture   Final    NO GROWTH  5 DAYS Performed at [facility]    Report Status 06/19/2015 FINAL  Final  Culture, Urine     Status: None   Collection Time: 06/14/15  7:41 AM  Result Value Ref Range Status   Specimen Description URINE, RANDOM  Final   Special Requests NONE  Final   Culture   Final    NO GROWTH 1 DAY Performed at [facility]    Report Status 06/15/2015 FINAL  Final  Culture, blood (Routine X 2) w Reflex to ID Panel     Status: None (Preliminary result)   Collection Time: 06/19/15  3:06 PM  Result Value Ref Range Status   Specimen Description BLOOD RIGHT ASSIST CONTROL  Final   Special Requests BOTTLES DRAWN AEROBIC ONLY 5CC  Final   Culture NO GROWTH < 24 HOURS  Final   Report Status PENDING  Incomplete  Culture, blood (Routine X 2) w Reflex to ID Panel     Status: None (Preliminary result)   Collection Time: 06/19/15  3:12 PM  Result Value Ref Range Status   Specimen Description BLOOD LEFT ASSIST CONTROL  Final   Special Requests BOTTLES DRAWN AEROBIC ONLY 5CC  Final   Culture NO GROWTH < 24 HOURS  Final   Report Status PENDING  Incomplete     Studies: Ct Abdomen Pelvis Wo Contrast  06/19/2015  CLINICAL DATA:  Bilateral hydronephrosis on recent ultrasounds. Renal insufficiency. Abdominal pain. Bloody stools. EXAM: CT ABDOMEN AND PELVIS WITHOUT CONTRAST TECHNIQUE: Multidetector CT imaging of the abdomen and pelvis was performed following the standard protocol without IV contrast. COMPARISON:  Renal ultrasound obtained yesterday FINDINGS: The examination is limited by streak artifact produced by the patient's arms. Lower chest: Small to moderate-sized left pleural effusion and small right pleural effusion. Bilateral lower lobe atelectasis, greater on the left. Hepatobiliary: Large number of small gallstones in the gallbladder. The largest measures 5 mm. No gallbladder wall thickening or pericholecystic fluid. Four left lobe liver cysts. There is also a 3.8 x 2.7 cm oval, oval area of  low density in the anterior segment of the right lobe of the liver measuring 26 Hounsfield units in density on image 10. Pancreas: No mass or inflammatory process identified on this un-enhanced exam. Spleen: Within normal limits in size.  Adrenals/Urinary Tract: Normal appearing right adrenal gland. The left adrenal gland is poorly visualized due to adjacent soft tissue stranding. No gross left adrenal abnormality seen. Again demonstrated is a large upper pole left renal cyst with diffuse, irregular wall thickening and a thin internal septation. This measures 13.2 cm in maximum diameter on image number 25 of series 2. There is surrounding soft tissue stranding. A smaller lower pole left renal cyst is noted. A large lower pole right renal cyst is demonstrated with a more simple appearance, measuring 12.1 cm in maximum diameter. There is a small linear calcification along the wall of this cyst laterally. There are 2 additional smaller right renal cysts. Mild to moderate dilatation of both renal collecting systems. Moderate dilatation of the left ureter to the ureterovesical junction. Mild to moderate dilatation of the right ureter to the level of the ureterovesical junction. No obstructing stones seen at those locations. There are multiple bladder calculi measuring up to 1.2 cm in maximum diameter each. There is also air in the urinary bladder and mild diffuse bladder wall thickening. A Foley catheter is in place. Stomach/Bowel: Multiple colonic diverticula without evidence of diverticulitis. No evidence of appendicitis. No gastric or small bowel abnormalities. Vascular/Lymphatic: Atheromatous arterial calcifications. No enlarged lymph nodes. Reproductive: Moderately enlarged prostate gland. Other: Small supraumbilical hernia containing fat. Musculoskeletal: Lumbar and lower thoracic spine degenerative changes. Mild right hip degenerative changes. Minimal left hip degenerative changes. IMPRESSION: 1. Limited  examination due to the lack of intravenous and oral contrast and due to streak artifacts produced by the patient's arms. 2. 13.2 cm complex left renal cyst with surrounding soft tissue stranding. This could represent an infected cyst. A cystic neoplasm is also possible. 3. Followup 0.1 cm minimally complex right renal cyst. 4. Mild to moderate bilateral hydronephrosis and bilateral hydroureter wrist new levels of the ureterovesical junctions with no obstructing calculus or mass seen. 5. Multiple bladder calculi. 6. Moderately enlarged prostate gland. 7. Mild diffuse bladder wall thickening, compatible with chronic bladder outlet obstruction. 8. Bilateral pleural effusions and bilateral lower lobe atelectasis. 9. Cholelithiasis. 10. 3.8 cm right lobe liver mass. This may represent a complicated cyst or solid mass. 11. Small supraumbilical hernia containing fat. Electronically Signed   By: Beckie Salts M.D.   On: 06/19/2015 13:20   Dg Chest 2 View  06/19/2015  CLINICAL DATA:  Leukocytosis.  Abdominal pain. EXAM: CHEST  2 VIEW COMPARISON:  06/13/2015. FINDINGS: Poor inspiration. No gross change in a normal sized heart. Mildly increased left basilar atelectasis. Interval right basilar atelectasis. Thoracic spine degenerative changes. Interval right jugular catheter with its tip in the inferior right atrium. No pneumothorax. IMPRESSION: 1. Right jugular catheter tip in the inferior right atrium. This could be retracted 6.5 cm to place in the superior vena cava. 2. Mildly increased left basilar atelectasis with interval mild to moderate right basilar atelectasis. Electronically Signed   By: Beckie Salts M.D.   On: 06/19/2015 13:28   Ct Head Wo Contrast  06/20/2015  CLINICAL DATA:  Acute onset of somnolence. Altered mental status. Initial encounter. EXAM: CT HEAD WITHOUT CONTRAST TECHNIQUE: Contiguous axial images were obtained from the base of the skull through the vertex without intravenous contrast. COMPARISON:   None. FINDINGS: There is no evidence of acute infarction, mass lesion, or intra- or extra-axial hemorrhage on CT. Mild periventricular white matter change likely reflects small vessel ischemic microangiopathy. The posterior fossa, including the cerebellum, brainstem and fourth ventricle, is within normal limits. The third  and lateral ventricles, and basal ganglia are unremarkable in appearance. The cerebral hemispheres are symmetric in appearance, with normal gray-white differentiation. No mass effect or midline shift is seen. There is no evidence of fracture; visualized osseous structures are unremarkable in appearance. The visualized portions of the orbits are within normal limits. The paranasal sinuses and mastoid air cells are well-aerated. No significant soft tissue abnormalities are seen. IMPRESSION: 1. No acute intracranial pathology seen on CT. 2. Mild small vessel ischemic microangiopathy. Electronically Signed   By: Roanna Raider M.D.   On: 06/20/2015 02:12    Scheduled Meds: . calcium acetate  667 mg Oral TID WC  . cefTRIAXone (ROCEPHIN)  IV  2 g Intravenous Q24H  . [START ON 06/21/2015] darbepoetin (ARANESP) injection - DIALYSIS  60 mcg Intravenous Q Tue-HD  . docusate sodium  100 mg Oral BID  . feeding supplement (NEPRO CARB STEADY)  237 mL Oral BID BM  . multivitamin  1 tablet Oral QHS   Continuous Infusions:    Time spent: > 35 minutes  Penny Pia  [facility] Pager [phone] If 7PM-7AM, please contact night-coverage at [email or web address], password [facility] 06/20/2015, 5:43 PM  LOS: 7 days

## 2015-06-20 NOTE — Care Management Important Message (Signed)
Important Message  Patient Details  Name: Jeff Parks MRN: [id number] Date of Birth: 1930/04/19   Medicare Important Message Given:  Yes    Bernadette Hoit 06/20/2015, 11:36 AM

## 2015-06-20 NOTE — Progress Notes (Signed)
Subjective:  Wife feeding brk . More alert today but still pleasantly confused = knows he is at [facility] but not aware of date or why here, no cos to me/  Has now had 3 dialysis treatments (last was 2/18) Using Temp R IJ cath (placed 2/16) and Still with minimal output which is bloody from foley Objective Vital signs in last 24 hours: Filed Vitals:   06/19/15 1000 06/19/15 1706 06/19/15 2040 06/20/15 0446  BP: 130/59 134/74 112/60 128/69  Pulse: 114 112 118 116  Temp: 99 F (37.2 C) 98.8 F (37.1 C) 99.4 F (37.4 C) 98.1 F (36.7 C)  TempSrc: Oral Oral Oral Oral  Resp: _0 Height:      Weight:      SpO2: 96% 98% 96% 95%   Weight change:   Physical Exam: General: [name] NAD/ more alert  With wife feeding / Oriented  only to [facility] Heart:rrrr, no rub, mur galop  Lungs; CTA somewhat poor resp effort/ non labored breathing Abdomen: Soft/ improved right lower quadrant tenderness to deep palp/ ND Extremities: No Pedal edema Dialysis Access: Right jugular trialysis catheter ( inserted 2/16)  2/19 Renal ultrasound to followup hydro: Length: 12.6 cm. Normal echogenicity. Stable dilatation of the collecting system and previously described cysts. Left Kidney: Length: 12.7 cm. Normal echogenicity. Stable dilatation of the collecting system. Stable large complex cyst   06/19/15  CT ABD=IMPRESSION:1. Limited examination due to the lack of intravenous and oralcontrast and due to streak artifacts produced by the patient's arms. 2. 13.2 cm complex left renal cyst with surrounding soft tissue stranding. This could represent an infected cyst. A cystic neoplasm is also possible. 3. Followup 0.1 cm minimally complex right renal cyst. 4. Mild to moderate bilateral hydronephrosis and bilateral hydroureter wrist new levels of the ureterovesical junctions with no obstructing calculus or mass seen. 5. Multiple bladder calculi. 6. Moderately enlarged prostate gland. 7. Mild diffuse bladder wall  thickening, compatible with chronic bladder outlet obstruction. 8. Bilateral pleural effusions and bilateral lower lobe atelectasis. 9. Cholelithiasis. 10. 3.8 cm right lobe liver mass. This may represent a complicated cyst or solid mass. 11. Small supraumbilical hernia containing fat.  Problem /Plan;  1. Renal failure with bilateral hydronephrosis on initial [place] (and was also taking NSAIDS) Unknown chronicity. Foley placed and initially 2L urine in the bladder (painless bladder distension) , but minimal UOP since foley placed. Creatinine came down a little the first day butthen remained around 12. Temporary HD catheter placed 06/16/15 in IR Dialysis initiated and has had 3 treatments- 2/16, 2/17 and 2/18. Labs are improved but worsening off of HD. Repeated renal ultrasound and CT show the collecting systems of both kidneys are still dilated - so the foley has apparently not take care of the obstructive process vs just dilation and not overt obstruction. CT  abd YEST AS ABOVE- again no overt source of obstruction /  Urology is not optomistic that further intervention will help his AKI.  I am allright just waiting for now and consider imaging again later in week if renal function continues to do poorly. Will need HD tomorrow for support 2.  Encephalopathy - sl better this am / CT Head = Mild Sm Vess Ischem microangiopathy/ no acute intracarn. Pathology/ ?? 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 Leukocytosis - WBC as high as 30,000 without fever, down  to 26.8 this am /on rocephin since 2/14, persistent bladder and abd pain. Reculture.as above. CXR yest=Mildly increased left basilar atelectasis with interval mild to moderate right basilar atelectasis 4.Mild anemia - Hgb remains around 10.2 >9.5 this am  Start Aranesp 60  q week ly hd / Fe stores adequate.  5.5Bladder sludge/bladder stones -  irrigating foley QShift (to make sure lack of urine output isn't related to bladder sludge occluding foley) 6  Large complex renal cyst - cannot exclude malignancy. Not the cause of his RF but will need eval at some point.. Urology aware 7. Elevated alk phos and bili. Gallstones on [place]. Labs normalized  8. MBD - check iPTH - P 5.8>7.1> 5.1 this am yest/  Started phoslo - last Ca   7.6 Corec ca 9.0 9.  Nutrition - Alb 2.0 >1.8>1.5 - Ensure / renal diet - multivit / sl better appetite this am    Godric Haber, PA-C [place] [phone] 06/20/2015,8:26 AM  LOS: 7 days    Patient seen and examined, agree with above note with above modifications. Labs worsening off of HD- MS seems poor to me.  Difficult to interpret these imaging studies to know if further urologic intervention would be helpful- plan for HD tomorrow - watch labs and UOP closely- consider re image at end of week.  Corliss Parish, MD 06/20/2015     Labs: Basic Metabolic Panel:  Recent Labs Lab 06/18/15 1119 06/19/15 1015 06/19/15 1530 06/20/15 0520  NA 135 139 137 136  K 3.5 4.0 3.9 3.9  CL 96* 99* 98* 99*  CO2 _0 GLUCOSE 151* 110* 186* 195*  BUN 72* 36* 41* 47*  CREATININE 9.44* 6.58* 7.43* 8.63*  CALCIUM 7.3* 7.6* 7.7* 7.6*  PHOS 5.3* 3.8  --  5.1*   Liver Function Tests:  Recent Labs Lab 06/13/15 1116 06/15/15 0437  06/19/15 1015 06/19/15 1530 06/20/15 0520  AST 40 29  --   --  38  --   ALT 62 43  --   --  32  --   ALKPHOS 203* 133*  --   --  90  --   BILITOT 1.3* 0.9  --   --  0.3  --   PROT 8.0 6.6  --   --  6.3*  --   ALBUMIN 2.6* 2.0*  < > 1.6* 1.5* 1.5*  < > = values in this interval not displayed.  Recent Labs Lab 06/13/15 1116 06/15/15 1152  LIPASE 54* 49    Recent Labs Lab 06/19/15 1530  AMMONIA 45*   CBC:  Recent Labs Lab 06/17/15 0722 06/17/15 1511 06/18/15 1119 06/19/15 0445 06/20/15 0520  WBC 29.3* 30.9* 26.2* 25.6* 26.8*   NEUTROABS  --  27.8*  --   --   --   HGB 10.2* 10.1* 9.5* 10.2* 9.5*  HCT 29.1* 29.7* 29.1* 31.4* 29.2*  MCV 81.3 81.6 82.9 84.0 84.6  PLT 302 307 243 199 191   Cardiac Enzymes: No results for input(s): CKTOTAL, CKMB, CKMBINDEX, TROPONINI in the last 168 hours. CBG: No results for input(s): GLUCAP in the last 168 hours.  Studies/Results: Medications:   . calcium acetate  667 mg Oral TID WC  . cefTRIAXone (ROCEPHIN)  IV  2 g Intravenous Q24H  . docusate sodium  100 mg Oral BID  . feeding supplement (ENSURE ENLIVE)  237 mL Oral BID BM  . multivitamin  1 tablet Oral QHS

## 2015-06-20 NOTE — Progress Notes (Signed)
Physical Therapy Treatment Patient Details Name: Jeff Parks MRN: [id number] DOB: 01-Sep-1929 Today's Date: 06/20/2015    History of Present Illness Jeff Parks is a 82 y.o. male with no previous medical problems who presents to the ED with complaints of Urinary retention x 3- 4 days. He reports not being able to urinate for the past 12 hours. He began to have fever today as well. He was evaluated in the ED, and a foley catheter was placed in the ED, and his BUN/Cr was elevated, and a Renal US was ordered and was admitted. Dx: Renal failure, urinary retention, Fever, anorexia, anemia, leukocytosis - due to early sepsis, HTN.    PT Comments    Pt making slow progress towards goals. Needing Maximal verbal cues for all mobility. Pt only able to follow about 50% of commands. Transfers and ambulation requiring Mod-Max A x 2 for safety due to balance deficits and lack of safety awareness and command following. Pt presenting with large posterior lean t/o session in sitting and standing. Will continue to follow pt acutely before D/C to SNF.  Follow Up Recommendations  SNF;Supervision/Assistance - 24 hour     Equipment Recommendations  None recommended by PT    Recommendations for Other Services       Precautions / Restrictions Precautions Precautions: Fall Restrictions Weight Bearing Restrictions: No    Mobility  Bed Mobility Overal bed mobility: Needs Assistance;+2 for physical assistance Bed Mobility: Rolling;Sidelying to Sit Rolling: Max assist Sidelying to sit: Max assist;+2 for physical assistance Supine to sit: Max assist Sit to supine: Max assist   General bed mobility comments: HOB slightly elevated, max assist to bring arm to railing and pt refused to use UE to assit in rolling, Max A to bering LEs to EOb, Max A to bring trunk upright, large posterior lean t/o requiring Max A to stay upright and prevent falling backwards, Max A to scoot hips to EOb  Transfers Overall  transfer level: Needs assistance Equipment used: Rolling walker (2 wheeled) Transfers: Sit to/from Stand Sit to Stand: Max assist;+2 physical assistance         General transfer comment: Max A x 2 for three trials. Max Vc and manual foot and hand placment  Ambulation/Gait Ambulation/Gait assistance: Mod assist;+2 physical assistance Ambulation Distance (Feet): 50 Feet Assistive device: Rolling walker (2 wheeled) Gait Pattern/deviations: Step-through pattern;Decreased stride length;Shuffle;Leaning posteriorly;Drifts right/left;Trunk flexed Gait velocity: slow Gait velocity interpretation: <1.8 ft/sec, indicative of risk for recurrent falls General Gait Details: Required constant tactile cues at posterior hip to encorage forward movement. Poor coordination of gait sequencing and use of RW simultaneously requiring frequent mod assist for balance and walker control. VC for walker placement and to hold at all times. Pt removed hands from walker in hallway to dance. Posterior loss of balance noted intermittently. No buckling.    Stairs            Wheelchair Mobility    Modified Rankin (Stroke Patients Only)       Balance Overall balance assessment: Needs assistance Sitting-balance support: Bilateral upper extremity supported;Feet supported Sitting balance-Leahy Scale: Poor Sitting balance - Comments: posteior lean which pt was unable to correct despite max Vcs Postural control: Posterior lean Standing balance support: Bilateral upper extremity supported;During functional activity Standing balance-Leahy Scale: Fair                      Cognition Arousal/Alertness: Lethargic Behavior During Therapy: Flat affect Overall Cognitive Status: Impaired/Different from baseline Area  of Impairment: Safety/judgement;Problem solving;Orientation Orientation Level: Disoriented to;Time;Situation       Safety/Judgement: Decreased awareness of safety   Problem Solving: Slow  processing;Decreased initiation;Difficulty sequencing;Requires verbal cues;Requires tactile cues      Exercises      General Comments        Pertinent Vitals/Pain Pain Assessment: No/denies pain    Home Living                      Prior Function            PT Goals (current goals can now be found in the care plan section) Acute Rehab PT Goals Patient Stated Goal: none stated Progress towards PT goals: Progressing toward goals    Frequency  Min 3X/week    PT Plan Current plan remains appropriate    Co-evaluation             End of Session Equipment Utilized During Treatment: Gait belt Activity Tolerance: Patient limited by lethargy Patient left: in chair;with call bell/phone within reach;with chair alarm set     Time: 1117-1150 PT Time Calculation (min) (ACUTE ONLY): 33 min  Charges:  $Gait Training: 8-22 mins $Therapeutic Activity: 8-22 mins                    G Codes:      Jeff Parks Jun 27, 2015, 12:21 PM Jeff Parks, Student Physical Therapist Acute Rehab [phone]

## 2015-06-20 NOTE — Progress Notes (Signed)
Occupational Therapy Treatment Patient Details Name: Lindsay Soulliere MRN: [id number] DOB: [birth year] Today's Date: 06/20/2015    History of present illness Cortavius Montesinos is a 82 y.o. male with no previous medical problems who presents to the ED with complaints of Urinary retention x 3- 4 days. He reports not being able to urinate for the past 12 hours. He began to have fever today as well. He was evaluated in the ED, and a foley catheter was placed in the ED, and his BUN/Cr was elevated, and a Renal US was ordered and was admitted. Dx: Renal failure, urinary retention, Fever, anorexia, anemia, leukocytosis - due to early sepsis, HTN.   OT comments  Note pts. Recent increase in lethargy and confusion.  Session limited today due to those reasons.  Pt. Unable to sustain attention with tactile and verbal instruction.  Attempted eob per pts. Spouse who was present.  pt. Unable to keep trunk upright with max a.  Required 2 person assist for back to bed.  Spouse Verbalized understanding of how pt. Was presenting this session yet continued to ask him to perform higher level mobility and ADLS.  Reviewed safety concerns multiple times.  Will continue to follow acutely as pt. Able to tolerate safely.    Follow Up Recommendations  Home health OT;Supervision/Assistance - 24 hour    Equipment Recommendations  3 in 1 bedside comode;Tub/shower bench    Recommendations for Other Services      Precautions / Restrictions Precautions Precautions: Fall       Mobility Bed Mobility Overal bed mobility: Needs Assistance Bed Mobility: Rolling;Sidelying to Sit;Supine to Sit Rolling: Max assist Sidelying to sit: Max assist Supine to sit: Max assist Sit to supine: Max assist   General bed mobility comments: hob elevated, max tactile guidance to initate b ue placement on bed rails.  unable to pull trunk upright without max a.  guided hips towards eob max a.  pt. with l ue intermittently holding onto bed rail but  falling backwards on bed multiple times unable to maintain supported or un supported sitting.  therapist educating wife on how pt. was not safe at this time.  she continued to verbalize understanding but also say "so hes not getting up today".  2 person assist for bed mobility back to bed and to pull pt. up in bed.  he had eyes closed majority of session and was unable to follow commands to assist with bed mobility.    Transfers                      Balance                                   ADL Overall ADL's : Needs assistance/impaired     Grooming: Wash/dry face;Total assistance;Sitting;Cueing for sequencing Grooming Details (indicate cue type and reason): wife present and reports pt. is L hand dominant, wash cloth placed in L hand, pt. unable to bring to face or complete face washing.  max hand over hand assistance to bring hand to face, and still unable to complete task                               General ADL Comments: pt. remains lethargic and md notes indicate more lethargic than previous days, with increased confusion.  pt.s wife present and states "oh  im glad you are here he just has to get up today".  reviewed recent documentation ie: lethargy and confusion, she verbalized understanding but continued to request him doing higher level tasks.        Vision                     Perception     Praxis      Cognition   Behavior During Therapy: Flat affect Overall Cognitive Status: Impaired/Different from baseline                Problem Solving: Slow processing;Decreased initiation;Difficulty sequencing;Requires verbal cues;Requires tactile cues      Extremity/Trunk Assessment               Exercises     Shoulder Instructions       General Comments  spouse present and appeared confused or unable to understand pts. Current limitations with mobility and self care tasks.  Answered all questions and provided education.   She is very concerned with his privacy and had 4 blankets tied to multiple bed rails so he could not pull covers off and expose his genitals.  Would not allow therapist asst. To place blankets back on pt. As she planned to tie them again.  rn aware.      Pertinent Vitals/ Pain       Pain Assessment: No/denies pain  Home Living                                          Prior Functioning/Environment              Frequency Min 2X/week     Progress Toward Goals  OT Goals(current goals can now be found in the care plan section)  Progress towards OT goals: Not progressing toward goals - comment (pt. with increased lethargy and confusion)     Plan Discharge plan remains appropriate    Co-evaluation                 End of Session     Activity Tolerance Patient limited by lethargy   Patient Left in bed;with call bell/phone within reach;with bed alarm set;with family/visitor present   Nurse Communication          Time: 1610-9604 OT Time Calculation (min): 15 min  Charges: OT General Charges $OT Visit: 1 Procedure OT Treatments $Therapeutic Activity: 8-22 mins  Robet Leu, COTA/L 06/20/2015, 10:05 AM

## 2015-06-20 NOTE — Progress Notes (Signed)
Nutrition Follow-up  DOCUMENTATION CODES:   Severe malnutrition in context of acute illness/injury  INTERVENTION:   - Discontinue Ensure Enlive. - Provide Nepro Shakes po BID between meals, each supplement provides 425 kcals and 19 grams of protein.   NUTRITION DIAGNOSIS:   Malnutrition related to acute illness as evidenced by moderate depletion of body fat, moderate depletions of muscle mass.  Ongoing  GOAL:   Patient will meet greater than or equal to 90% of their needs  Unmet  MONITOR:   PO intake, Supplement acceptance, Labs, Weight trends, I & O's  ASSESSMENT:   82-year-old male with no significant known past medical history but has not been to the doctor in many many years who presented with progressive weakness, clouded thinking, difficulty urinating and decreased urine output. He also also had hiccups for the last 5 days prior to admission and poor by mouth intake. He denied vomiting. He had some constipation that resolved with stool softeners and resulted in diarrhea.   Met with pt and family members (wife and daughter).  Wife reports much improved po intake.  States pt ate eggs, frosted flakes, and juice for breakfast.  States pt ate beef with gravy, carrots, pears, 1 tbsp rice, and 1 broccoli floret for lunch.  Per chart review patient has had variable po's between 0 - 90%, with no meal completion records for today.  Wife states pt drank 1/2 an Ensure as well.  Reports that he might would consume more Ensure, but he has been getting them too close to meal times.  Wife reports that she has been feeding pt to make sure he is eating better.       Medications reviewed and include: Phoslo, MVI.  Labs reviewed and include: high glucose (186 - 195), high phosphorus (5.1), high BUN/creatinine.  RD will discontinue Ensure Enlive and order Nepro BID between meals since pt had high phosphorus and glucose levels. Pt and family amenable to this change.  Diet Order:  Diet renal with  fluid restriction Fluid restriction:: 1200 mL Fluid; Room service appropriate?: Yes; Fluid consistency:: Thin  Skin:  Reviewed, no issues  Last BM:  2/18  Height:   Ht Readings from Last 1 Encounters:  06/14/15 6' (1.829 m)    Weight:   Wt Readings from Last 1 Encounters:  06/19/15 173 lb 15.1 oz (78.9 kg)    Ideal Body Weight:  80.9 kg  BMI:  Body mass index is 23.59 kg/(m^2).  Estimated Nutritional Needs:   Kcal:  2100-2300  Protein:  90-100g  Fluid:  1.2 L (fluid restriction)  EDUCATION NEEDS:   Education needs addressed  Coleman Murray, Dietetic Intern Pager: [phone]  

## 2015-06-20 NOTE — Progress Notes (Signed)
Subjective: Patient 's wife reports that he continues to be confused. He has not had rectal exam, and has never seen a doctor before this hospitalization. He had a 2L urinary retention, with Cr 14  on admission, and gfr=3. Now on dialysis. Dr. Eliott Nine has noted hydronephrosis has remained and has asked for urology re-evaluation for consideration of (TURP, cystolitholapaxy, and) JJ stents.   Objective: Vital signs in last 24 hours: Temp:  [98.1 F (36.7 C)-99.4 F (37.4 C)] 98.1 F (36.7 C) (02/20 0446) Pulse Rate:  [112-118] 116 (02/20 0446) Resp:  [18-20] 20 (02/20 0446) BP: (112-134)/(60-74) 128/69 mmHg (02/20 0446) SpO2:  [95 %-98 %] 95 % (02/20 0446)A  Intake/Output from previous day: 02/19 0701 - 02/20 0700 In: 290 [P.O.:150; IV Piggyback:100] Out: 0  Intake/Output this shift:    Past Medical History  Diagnosis Date  . Constipation     Physical Exam:  Lungs - Normal respiratory effort, chest expands symmetrically.  Abdomen - Soft, non-tender & non-distended. Rectal exam: Pt is uncooperative. 4+ sphincter tone. Pt moving legs and arms. Prostate cannot be safely assessed.  Lab Results:  Recent Labs  06/18/15 1119 06/19/15 0445 06/20/15 0520  WBC 26.2* 25.6* 26.8*  HGB 9.5* 10.2* 9.5*  HCT 29.1* 31.4* 29.2*   BMET  Recent Labs  06/19/15 1530 06/20/15 0520  NA 137 136  K 3.9 3.9  CL 98* 99*  CO2 25 25  GLUCOSE 186* 195*  BUN 41* 47*  CREATININE 7.43* 8.63*  CALCIUM 7.7* 7.6*   No results for input(s): LABURIN in the last 72 hours. Results for orders placed or performed during the hospital encounter of 06/13/15  Culture, blood (x 2)     Status: None   Collection Time: 06/13/15 11:03 PM  Result Value Ref Range Status   Specimen Description BLOOD LEFT ARM  Final   Special Requests BOTTLES DRAWN AEROBIC AND ANAEROBIC 5 CC  Final   Culture   Final    NO GROWTH 5 DAYS Performed at [facility]    Report Status 06/19/2015 FINAL  Final  Culture,  blood (x 2)     Status: None   Collection Time: 06/13/15 11:04 PM  Result Value Ref Range Status   Specimen Description BLOOD RIGHT HAND  Final   Special Requests BOTTLES DRAWN AEROBIC AND ANAEROBIC 5 CC  Final   Culture   Final    NO GROWTH 5 DAYS Performed at [facility]    Report Status 06/19/2015 FINAL  Final  Culture, Urine     Status: None   Collection Time: 06/14/15  7:41 AM  Result Value Ref Range Status   Specimen Description URINE, RANDOM  Final   Special Requests NONE  Final   Culture   Final    NO GROWTH 1 DAY Performed at [facility]    Report Status 06/15/2015 FINAL  Final    Studies/Results: Ct Abdomen Pelvis Wo Contrast  06/19/2015  CLINICAL DATA:  Bilateral hydronephrosis on recent ultrasounds. Renal insufficiency. Abdominal pain. Bloody stools. EXAM: CT ABDOMEN AND PELVIS WITHOUT CONTRAST TECHNIQUE: Multidetector CT imaging of the abdomen and pelvis was performed following the standard protocol without IV contrast. COMPARISON:  Renal ultrasound obtained yesterday FINDINGS: The examination is limited by streak artifact produced by the patient's arms. Lower chest: Small to moderate-sized left pleural effusion and small right pleural effusion. Bilateral lower lobe atelectasis, greater on the left. Hepatobiliary: Large number of small gallstones in the gallbladder. The largest measures 5  mm. No gallbladder wall thickening or pericholecystic fluid. Four left lobe liver cysts. There is also a 3.8 x 2.7 cm oval, oval area of low density in the anterior segment of the right lobe of the liver measuring 26 Hounsfield units in density on image 10. Pancreas: No mass or inflammatory process identified on this un-enhanced exam. Spleen: Within normal limits in size. Adrenals/Urinary Tract: Normal appearing right adrenal gland. The left adrenal gland is poorly visualized due to adjacent soft tissue stranding. No gross left adrenal abnormality seen. Again demonstrated is a  large upper pole left renal cyst with diffuse, irregular wall thickening and a thin internal septation. This measures 13.2 cm in maximum diameter on image number 25 of series 2. There is surrounding soft tissue stranding. A smaller lower pole left renal cyst is noted. A large lower pole right renal cyst is demonstrated with a more simple appearance, measuring 12.1 cm in maximum diameter. There is a small linear calcification along the wall of this cyst laterally. There are 2 additional smaller right renal cysts. Mild to moderate dilatation of both renal collecting systems. Moderate dilatation of the left ureter to the ureterovesical junction. Mild to moderate dilatation of the right ureter to the level of the ureterovesical junction. No obstructing stones seen at those locations. There are multiple bladder calculi measuring up to 1.2 cm in maximum diameter each. There is also air in the urinary bladder and mild diffuse bladder wall thickening. A Foley catheter is in place. Stomach/Bowel: Multiple colonic diverticula without evidence of diverticulitis. No evidence of appendicitis. No gastric or small bowel abnormalities. Vascular/Lymphatic: Atheromatous arterial calcifications. No enlarged lymph nodes. Reproductive: Moderately enlarged prostate gland. Other: Small supraumbilical hernia containing fat. Musculoskeletal: Lumbar and lower thoracic spine degenerative changes. Mild right hip degenerative changes. Minimal left hip degenerative changes. IMPRESSION: 1. Limited examination due to the lack of intravenous and oral contrast and due to streak artifacts produced by the patient's arms. 2. 13.2 cm complex left renal cyst with surrounding soft tissue stranding. This could represent an infected cyst. A cystic neoplasm is also possible. 3. Followup 0.1 cm minimally complex right renal cyst. 4. Mild to moderate bilateral hydronephrosis and bilateral hydroureter wrist new levels of the ureterovesical junctions with no  obstructing calculus or mass seen. 5. Multiple bladder calculi. 6. Moderately enlarged prostate gland. 7. Mild diffuse bladder wall thickening, compatible with chronic bladder outlet obstruction. 8. Bilateral pleural effusions and bilateral lower lobe atelectasis. 9. Cholelithiasis. 10. 3.8 cm right lobe liver mass. This may represent a complicated cyst or solid mass. 11. Small supraumbilical hernia containing fat. Electronically Signed   By: Beckie Salts M.D.   On: 06/19/2015 13:20   Dg Chest 2 View  06/19/2015  CLINICAL DATA:  Leukocytosis.  Abdominal pain. EXAM: CHEST  2 VIEW COMPARISON:  06/13/2015. FINDINGS: Poor inspiration. No gross change in a normal sized heart. Mildly increased left basilar atelectasis. Interval right basilar atelectasis. Thoracic spine degenerative changes. Interval right jugular catheter with its tip in the inferior right atrium. No pneumothorax. IMPRESSION: 1. Right jugular catheter tip in the inferior right atrium. This could be retracted 6.5 cm to place in the superior vena cava. 2. Mildly increased left basilar atelectasis with interval mild to moderate right basilar atelectasis. Electronically Signed   By: Beckie Salts M.D.   On: 06/19/2015 13:28   Ct Head Wo Contrast  06/20/2015  CLINICAL DATA:  Acute onset of somnolence. Altered mental status. Initial encounter. EXAM: CT HEAD WITHOUT CONTRAST TECHNIQUE:  Contiguous axial images were obtained from the base of the skull through the vertex without intravenous contrast. COMPARISON:  None. FINDINGS: There is no evidence of acute infarction, mass lesion, or intra- or extra-axial hemorrhage on CT. Mild periventricular white matter change likely reflects small vessel ischemic microangiopathy. The posterior fossa, including the cerebellum, brainstem and fourth ventricle, is within normal limits. The third and lateral ventricles, and basal ganglia are unremarkable in appearance. The cerebral hemispheres are symmetric in appearance,  with normal gray-white differentiation. No mass effect or midline shift is seen. There is no evidence of fracture; visualized osseous structures are unremarkable in appearance. The visualized portions of the orbits are within normal limits. The paranasal sinuses and mastoid air cells are well-aerated. No significant soft tissue abnormalities are seen. IMPRESSION: 1. No acute intracranial pathology seen on CT. 2. Mild small vessel ischemic microangiopathy. Electronically Signed   By: Roanna Raider M.D.   On: 06/20/2015 02:12   COMPARISON: None.  FINDINGS: Right Kidney:  Length: 12.6 cm. Normal echogenicity. Mild hydronephrosis. There is a 12 cm renal cyst and a 3 cm renal cyst, both of which appear relatively simple.  Left Kidney:  Length: 12.7 cm. Mild hydronephrosis. Normal echogenicity. 14 cm cysts with numerous internal septations and wall thickening.  Bladder:  Numerous bladder calculi identified. There is debris layering within the bladder and the largest calculus measures about 13 mm.  Prostate is enlarged at 5.4 cm.  Incidentally detected is a wall echo shadow complex involving the gallbladder indicating cholelithiasis.  IMPRESSION: 1. Extensive cholelithiasis 2. Enlarged prostate 3. Bladder calculi 4. Large bilateral renal cysts. On the left, a 14 cm cyst shows a complex appearance and malignancy is not excluded. Further evaluation with renal protocol CT scan or MRI suggested. 5. Mild bilateral hydronephrosis   Electronically Signed  By: Esperanza Heir M.D.  On: 06/13/2015 18:48 Assessment: 1. Acute and chronic urinary retention, with ESRD, now on dialysis                         2. Bilateral Hydronephrosis, now mild. Doubt significance in face of bladder stones.                          3. bladder stones-possible cause of mild hydro..                          4. Urine c/s: no growth.                         5. Rectal exam: normal tone. Prostate  cannot be examined. PSA can be drawn, but will not be easily interpretable, if abnormal, because if the presence of foley cath causing irritation.                        Plan:  1. PSA. Caution with interpretation.              2. Pt. Will be assessed by Renal on rounds. Please call me if Renal feels that further  Urologic Rx is warranted.   Marquia Costello I Marisella Puccio 06/20/2015, 11:02 AM

## 2015-06-21 LAB — CBC
HEMATOCRIT: 28.1 % — AB (ref 39.0–52.0)
Hemoglobin: 9.1 g/dL — ABNORMAL LOW (ref 13.0–17.0)
MCH: 27.2 pg (ref 26.0–34.0)
MCHC: 32.4 g/dL (ref 30.0–36.0)
MCV: 83.9 fL (ref 78.0–100.0)
Platelets: 205 10*3/uL (ref 150–400)
RBC: 3.35 MIL/uL — ABNORMAL LOW (ref 4.22–5.81)
RDW: 13.9 % (ref 11.5–15.5)
WBC: 26.3 10*3/uL — ABNORMAL HIGH (ref 4.0–10.5)

## 2015-06-21 LAB — RENAL FUNCTION PANEL
ANION GAP: 14 (ref 5–15)
Albumin: 1.5 g/dL — ABNORMAL LOW (ref 3.5–5.0)
BUN: 63 mg/dL — AB (ref 6–20)
CHLORIDE: 98 mmol/L — AB (ref 101–111)
CO2: 24 mmol/L (ref 22–32)
Calcium: 7.9 mg/dL — ABNORMAL LOW (ref 8.9–10.3)
Creatinine, Ser: 10.65 mg/dL — ABNORMAL HIGH (ref 0.61–1.24)
GFR calc Af Amer: 4 mL/min — ABNORMAL LOW (ref >60)
GFR, EST NON AFRICAN AMERICAN: 4 mL/min — AB (ref >60)
GLUCOSE: 108 mg/dL — AB (ref 65–99)
POTASSIUM: 4.2 mmol/L (ref 3.5–5.1)
Phosphorus: 6 mg/dL — ABNORMAL HIGH (ref 2.5–4.6)
Sodium: 136 mmol/L (ref 135–145)

## 2015-06-21 LAB — PARATHYROID HORMONE, INTACT (NO CA): PTH: 194 pg/mL — ABNORMAL HIGH (ref 15–65)

## 2015-06-21 MED ORDER — DARBEPOETIN ALFA 60 MCG/0.3ML IJ SOSY
PREFILLED_SYRINGE | INTRAMUSCULAR | Status: AC
  Administered 2015-06-21: 60 ug via INTRAVENOUS
  Filled 2015-06-21: qty 0.3

## 2015-06-21 MED ORDER — PENICILLIN G BENZATHINE & PROC 900000-300000 UNIT/2ML IM SUSP
1.2000 10*6.[IU] | INTRAMUSCULAR | Status: DC
  Filled 2015-06-21: qty 2

## 2015-06-21 NOTE — Progress Notes (Signed)
Subjective:  Seen on HD- restless but directable- This is his fourth HD treatment Using Temp R IJ cath (placed 2/16) and Still with essentially no UOP  Objective Vital signs in last 24 hours: Filed Vitals:   06/21/15 0922 06/21/15 0950 06/21/15 0959 06/21/15 1014  BP: 137/55 133/68 133/68 121/78  Pulse: 123  124 118  Temp:      TempSrc:      Resp:      Height:      Weight:      SpO2:       Weight change:   Physical Exam: General: [name] is restless with HD- Heart:rrrr, no rub, mur galop  Lungs; CTA somewhat poor resp effort/ non labored breathing Abdomen: Soft/ improved right lower quadrant tenderness to deep palp/ ND Extremities: No Pedal edema Dialysis Access: Right jugular trialysis catheter ( inserted 2/16)  2/19 Renal ultrasound to followup hydro: Length: 12.6 cm. Normal echogenicity. Stable dilatation of the collecting system and previously described cysts. Left Kidney: Length: 12.7 cm. Normal echogenicity. Stable dilatation of the collecting system. Stable large complex cyst   06/19/15  CT ABD=IMPRESSION:1. Limited examination due to the lack of intravenous and oralcontrast and due to streak artifacts produced by the patient's arms. 2. 13.2 cm complex left renal cyst with surrounding soft tissue stranding. This could represent an infected cyst. A cystic neoplasm is also possible. 3. Followup 0.1 cm minimally complex right renal cyst. 4. Mild to moderate bilateral hydronephrosis and bilateral hydroureter wrist new levels of the ureterovesical junctions with no obstructing calculus or mass seen. 5. Multiple bladder calculi. 6. Moderately enlarged prostate gland. 7. Mild diffuse bladder wall thickening, compatible with chronic bladder outlet obstruction. 8. Bilateral pleural effusions and bilateral lower lobe atelectasis. 9. Cholelithiasis. 10. 3.8 cm right lobe liver mass. This may represent a complicated cyst or solid mass. 11. Small supraumbilical hernia containing  fat.  Problem /Plan;  1. Renal failure with bilateral hydronephrosis on initial [place] (and was also taking NSAIDS) Unknown chronicity as no previous information on kidney function. Foley placed and initially 2L urine in the bladder (painless bladder distension) , but minimal UOP since foley placed. Creatinine came down a little the first day butthen remained around 12. Temporary HD catheter placed 06/16/15 in IR Dialysis initiated and has had 3 treatments- 2/16, 2/17 and 2/18 and 4th today due to worsening numbers. Labs are overall  improved but worsening off of HD. Repeated renal ultrasound and CT show the collecting systems of both kidneys are still dilated - unclear if  the foley has not taken care of the obstructive process vs just dilation and not overt obstruction. CT  abd YEST AS ABOVE- again no overt source of obstruction /  Urology is not optomistic that further intervention will help his AKI.  I am allright just waiting for now and consider imaging again later in week if renal function continues to do poorly. Follow labs and UOP daily for now 2.  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 Pathology/ ?? 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 Leukocytosis - WBC as high as 30,000 without fever, down to 26.8 this am /on rocephin since 2/14, persistent bladder and abd pain. Reculture.as above. CXR yest=Mildly increased left basilar atelectasis with interval mild to moderate right basilar atelectasis 4.Mild anemia - Hgb remains around 10.2 >9.1 this  am  Start Aranesp 60  q week ly hd / Fe stores adequate.  5.5Bladder sludge/bladder stones - irrigating foley QShift (to make sure lack of urine output isn't related to bladder sludge occluding foley) 6  Large complex renal cyst - cannot exclude malignancy. Not the cause of his RF but will need eval at  some point.. Urology aware 7. Elevated alk phos and bili. Gallstones on [place]. Labs normalized  8. MBD - check iPTH - P 5.8>7.1> 6.0  this am   Started phoslo - last Ca   7.6 Corec ca 9.0 9.  Nutrition - Alb 2.0 >1.8>1.5 - Ensure / renal diet - multivit / sl better appetite this am 10.  Dispo- does not seem to be overall improving, now sure why or what baseline really was  - renal failure is not the only issue here    Corliss Parish, MD 06/21/2015     Labs: Basic Metabolic Panel:  Recent Labs Lab 06/19/15 1015 06/19/15 1530 06/20/15 0520 06/21/15 0706  NA 139 137 136 136  K 4.0 3.9 3.9 4.2  CL 99* 98* 99* 98*  CO2 '26 25 25 24  ' GLUCOSE 110* 186* 195* 108*  BUN 36* 41* 47* 63*  CREATININE 6.58* 7.43* 8.63* 10.65*  CALCIUM 7.6* 7.7* 7.6* 7.9*  PHOS 3.8  --  5.1* 6.0*   Liver Function Tests:  Recent Labs Lab 06/15/15 0437  06/19/15 1530 06/20/15 0520 06/21/15 0706  AST 29  --  38  --   --   ALT 43  --  32  --   --   ALKPHOS 133*  --  90  --   --   BILITOT 0.9  --  0.3  --   --   PROT 6.6  --  6.3*  --   --   ALBUMIN 2.0*  < > 1.5* 1.5* 1.5*  < > = values in this interval not displayed.  Recent Labs Lab 06/15/15 1152  LIPASE 49    Recent Labs Lab 06/19/15 1530  AMMONIA 45*   CBC:  Recent Labs Lab 06/17/15 1511 06/18/15 1119 06/19/15 0445 06/20/15 0520 06/21/15 0707  WBC 30.9* 26.2* 25.6* 26.8* 26.3*  NEUTROABS 27.8*  --   --   --   --   HGB 10.1* 9.5* 10.2* 9.5* 9.1*  HCT 29.7* 29.1* 31.4* 29.2* 28.1*  MCV 81.6 82.9 84.0 84.6 83.9  PLT 307 243 199 191 205   Cardiac Enzymes: No results for input(s): CKTOTAL, CKMB, CKMBINDEX, TROPONINI in the last 168 hours. CBG: No results for input(s): GLUCAP in the last 168 hours.  Studies/Results: Medications:   . calcium acetate  667 mg Oral TID WC  . cefTRIAXone (ROCEPHIN)  IV  2 g Intravenous Q24H  . Darbepoetin Alfa      . darbepoetin (ARANESP) injection - DIALYSIS  60 mcg Intravenous Q  Tue-HD  . docusate sodium  100 mg Oral BID  . feeding supplement (NEPRO CARB STEADY)  237 mL Oral BID BM  . multivitamin  1 tablet Oral QHS

## 2015-06-21 NOTE — Progress Notes (Deleted)
   06/21/15 1500  Clinical Encounter Type  Visited With Patient and family together  Visit Type Spiritual support  Referral From Care management  Spiritual Encounters  Spiritual Needs Prayer;Emotional  Stress Factors  Patient Stress Factors Lack of knowledge;Health changes  Family Stress Factors Lack of knowledge;Health changes  Spent some time with patient, son, and wife. Patient just given unexpected cancer diagnosis and still adjusting to that news. Prayed with family and patient, who are all hoping that he will make it into Thursday's clinic at [facility] and not have to wait another week. Sheana Bir, Chaplain

## 2015-06-21 NOTE — Progress Notes (Signed)
   06/21/15 1552  Clinical Encounter Type  Visited With Patient and family together  Visit Type Spiritual support  Referral From Care management  Spiritual Encounters  Spiritual Needs Emotional  Stress Factors  Patient Stress Factors Other (Comment) (confinement to bed)  Family Stress Factors Lack of knowledge;Major life changes  Chaplain erroneously entered info in previous note from another patient's visit. Chaplain actually visited with daughter and spent considerable time addressing her feelings and experiences about her dad's and mom's situation. Daughter concerned about future situation for parents, if patient continues in this condition, as mother would not be able to care for him at home. Chloie Loney, Chaplain

## 2015-06-21 NOTE — Progress Notes (Signed)
[facility] PROGRESS NOTE  Jeff Parks ZOX:[id number] DOB: 11/21/1929 DOA: 06/13/2015 PCP: No primary care provider on file.  Brief Narrative: The patient is an 82 year old male with no significant known past medical history but has not been to the doctor in many many years who presented with progressive weakness, clouded thinking, difficulty urinating and decreased urine output. Has required dialysis while here and has had multiple evaluations by urology for hydronephrosis  Assessment/Plan:  Principal Problem: Altered mental status - Patient is more alert.  - CT of head reports no acute intracranial pathology. Medication list reviewed - folate and vitamin b 12 wnl - TSH low but T4 WNL - HIV non reactive, RPR reactive however with positive T pallidum abs, discussed with ID who reports that it is unlikely that he has neuro syphyllis.   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    Bright red blood per rectum - No active evidence of significant bleeding.  - hgb stable   Code Status: full Family Communication: d/c patient directly Disposition Plan: Pending recommendations from specialist   Consultants:  Nephrology  Radiology  Urology  Procedures:  HD  Antibiotics:  Rocephin  HPI/Subjective: Pt has no new complaints reported to me today.  Objective: Filed Vitals:   06/21/15 1110 06/21/15 1123  BP: 110/57 105/65  Pulse: 124 117  Temp:    Resp:  15    Intake/Output Summary (Last 24 hours) at 06/21/15 1702 Last data filed at 06/21/15 1300  Gross per 24 hour   Intake    360 ml  Output   1545 ml  Net  -1185 ml   Filed Weights   06/19/15 0000 06/21/15 0650 06/21/15 1123  Weight: 78.9 kg (173 lb 15.1 oz) 84.1 kg (185 lb 6.5 oz) 81.6 kg (179 lb 14.3 oz)    Exam:   General:  Pt in nad   Cardiovascular: rrr, no mrg  Respiratory: cta bl, no wheezes  Abdomen: soft, no guarding  Musculoskeletal: no cyanosis on limited exam   Data Reviewed: Basic Metabolic Panel:  Recent Labs Lab 06/17/15 1511 06/18/15 1119 06/19/15 1015 06/19/15 1530 06/20/15 0520 06/21/15 0706  NA 140 135 139 137 136 136  K 4.2 3.5 4.0 3.9 3.9 4.2  CL 97* 96* 99* 98* 99* 98*  CO2 21* 26 26 25 25 24   GLUCOSE 150* 151* 110* 186* 195* 108*  BUN 132* 72* 36* 41* 47* 63*  CREATININE 13.52* 9.44* 6.58* 7.43* 8.63* 10.65*  CALCIUM 7.8* 7.3* 7.6* 7.7* 7.6* 7.9*  PHOS 7.1* 5.3* 3.8  --  5.1* 6.0*   Liver Function Tests:  Recent Labs Lab 06/15/15 0437  06/18/15 1119 06/19/15 1015 06/19/15 1530 06/20/15 0520 06/21/15 0706  AST 29  --   --   --  38  --   --   ALT 43  --   --   --  32  --   --   ALKPHOS 133*  --   --   --  90  --   --   BILITOT 0.9  --   --   --  0.3  --   --   PROT 6.6  --   --   --  6.3*  --   --   ALBUMIN 2.0*  < > 1.6* 1.6* 1.5* 1.5* 1.5*  < > = values in this interval not displayed.  Recent Labs Lab 06/15/15 1152  LIPASE 49    Recent Labs Lab 06/19/15 1530  AMMONIA 45*   CBC:  Recent Labs Lab 06/17/15 1511 06/18/15 1119 06/19/15 0445 06/20/15 0520 06/21/15 0707  WBC 30.9* 26.2* 25.6* 26.8* 26.3*  NEUTROABS 27.8*  --   --   --   --   HGB 10.1* 9.5* 10.2* 9.5* 9.1*  HCT 29.7* 29.1* 31.4* 29.2* 28.1*  MCV 81.6 82.9 84.0 84.6 83.9  PLT 307 243 199 191 205   Cardiac Enzymes: No results for input(s): CKTOTAL, CKMB, CKMBINDEX, TROPONINI in the last 168 hours. BNP (last 3 results) No results for input(s): BNP in the last 8760 hours.  ProBNP (last 3 results) No results for input(s): PROBNP in the last 8760  hours.  CBG: No results for input(s): GLUCAP in the last 168 hours.  Recent Results (from the past 240 hour(s))  Culture, blood (x 2)     Status: None   Collection Time: 06/13/15 11:03 PM  Result Value Ref Range Status   Specimen Description BLOOD LEFT ARM  Final   Special Requests BOTTLES DRAWN AEROBIC AND ANAEROBIC 5 CC  Final   Culture   Final    NO GROWTH 5 DAYS Performed at [facility]    Report Status 06/19/2015 FINAL  Final  Culture, blood (x 2)     Status: None   Collection Time: 06/13/15 11:04 PM  Result Value Ref Range Status   Specimen Description BLOOD RIGHT HAND  Final   Special Requests BOTTLES DRAWN AEROBIC AND ANAEROBIC 5 CC  Final   Culture   Final    NO GROWTH 5 DAYS Performed at [facility]    Report Status 06/19/2015 FINAL  Final  Culture, Urine     Status: None   Collection Time: 06/14/15  7:41 AM  Result Value Ref Range Status   Specimen Description URINE, RANDOM  Final   Special Requests NONE  Final   Culture   Final    NO GROWTH 1 DAY Performed at [facility]    Report Status 06/15/2015 FINAL  Final  Culture, blood (Routine X 2) w Reflex to ID Panel     Status: None (Preliminary result)   Collection Time: 06/19/15  3:06 PM  Result Value Ref Range Status   Specimen Description BLOOD RIGHT ANTECUBITAL  Final   Special Requests BOTTLES DRAWN AEROBIC ONLY 5CC  Final   Culture NO GROWTH 2 DAYS  Final   Report Status PENDING  Incomplete  Culture, blood (Routine X 2) w Reflex to ID Panel     Status: None (Preliminary result)   Collection Time: 06/19/15  3:12 PM  Result Value Ref Range Status   Specimen Description BLOOD LEFT ANTECUBITAL  Final   Special Requests BOTTLES DRAWN AEROBIC ONLY 5CC  Final   Culture NO GROWTH 2 DAYS  Final   Report Status PENDING  Incomplete     Studies: Ct Head Wo Contrast  06/20/2015  CLINICAL DATA:  Acute onset of somnolence. Altered mental status. Initial encounter. EXAM: CT HEAD WITHOUT  CONTRAST TECHNIQUE: Contiguous axial images were obtained from the base of the skull through the vertex without intravenous contrast. COMPARISON:  None. FINDINGS: There is no evidence of acute infarction, mass lesion, or intra- or extra-axial hemorrhage on CT. Mild periventricular white matter change likely reflects small vessel ischemic microangiopathy. The posterior fossa, including the cerebellum, brainstem and fourth ventricle, is within normal limits. The third and lateral ventricles, and basal ganglia are unremarkable in appearance. The cerebral hemispheres are symmetric in appearance, with normal gray-white differentiation. No mass effect or midline shift is seen. There is no evidence of fracture; visualized osseous structures are unremarkable in appearance. The visualized portions of the orbits are within normal limits. The paranasal sinuses and mastoid air cells are well-aerated. No significant soft tissue abnormalities are seen. IMPRESSION: 1. No acute intracranial pathology seen on CT. 2. Mild small vessel ischemic microangiopathy. Electronically Signed   By: Roanna Raider M.D.   On: 06/20/2015 02:12    Scheduled Meds: . calcium acetate  667 mg Oral TID WC  . cefTRIAXone (ROCEPHIN)  IV  2 g Intravenous Q24H  . darbepoetin (ARANESP) injection - DIALYSIS  60 mcg Intravenous Q Tue-HD  . docusate sodium  100 mg Oral BID  . feeding supplement (NEPRO CARB STEADY)  237 mL Oral BID BM  . multivitamin  1 tablet Oral QHS   Continuous Infusions:    Time spent: > 35 minutes  Penny Pia  [facility] Pager [phone] If 7PM-7AM, please contact night-coverage at [email or web address], password [facility] 06/21/2015, 5:02 PM  LOS: 8 days

## 2015-06-21 NOTE — Progress Notes (Signed)
Hemodialysis- Treatment complete. Total UF 1.5L d/t system clotting x2 (d/t machine alarming constantly with patient movement.) Restraints discontinued. Pt not currently pulling at lines but remains very restless. Vitals stable. Report called to primary RN.

## 2015-06-21 NOTE — Progress Notes (Signed)
Hemodialysis- Pt has been restless all morning. Soft mittens currently on pt. Cannot keep hd going effectivly d/t moving around in bed, grabbing at lines. Order for soft wrist restraint received (for last two hours of hd) for patient safety from PA. Continue to monitor patient.

## 2015-06-21 NOTE — Procedures (Signed)
Patient was seen on dialysis and the procedure was supervised.  BFR 250  Via vascath BP is  135/58.   Patient appears to be tolerating treatment well  Patterson Hollenbaugh A 06/21/2015

## 2015-06-22 DIAGNOSIS — G9340 Encephalopathy, unspecified: Secondary | ICD-10-CM

## 2015-06-22 LAB — CBC
HCT: 26.7 % — ABNORMAL LOW (ref 39.0–52.0)
Hemoglobin: 8.9 g/dL — ABNORMAL LOW (ref 13.0–17.0)
MCH: 28.5 pg (ref 26.0–34.0)
MCHC: 33.3 g/dL (ref 30.0–36.0)
MCV: 85.6 fL (ref 78.0–100.0)
PLATELETS: 205 10*3/uL (ref 150–400)
RBC: 3.12 MIL/uL — AB (ref 4.22–5.81)
RDW: 14.1 % (ref 11.5–15.5)
WBC: 20.9 10*3/uL — AB (ref 4.0–10.5)

## 2015-06-22 LAB — RENAL FUNCTION PANEL
ALBUMIN: 1.5 g/dL — AB (ref 3.5–5.0)
Anion gap: 15 (ref 5–15)
BUN: 38 mg/dL — ABNORMAL HIGH (ref 6–20)
CALCIUM: 7.7 mg/dL — AB (ref 8.9–10.3)
CHLORIDE: 97 mmol/L — AB (ref 101–111)
CO2: 24 mmol/L (ref 22–32)
CREATININE: 7.85 mg/dL — AB (ref 0.61–1.24)
GFR, EST AFRICAN AMERICAN: 6 mL/min — AB (ref >60)
GFR, EST NON AFRICAN AMERICAN: 6 mL/min — AB (ref >60)
Glucose, Bld: 87 mg/dL (ref 65–99)
PHOSPHORUS: 6.3 mg/dL — AB (ref 2.5–4.6)
Potassium: 4.9 mmol/L (ref 3.5–5.1)
SODIUM: 136 mmol/L (ref 135–145)

## 2015-06-22 NOTE — Progress Notes (Addendum)
[facility] PROGRESS NOTE  Sandor Arboleda BJY:[id number] DOB: 16-Apr-1930 DOA: 06/13/2015 PCP: No primary care provider on file.  brief narrative 82 year old male with no known past medical history, has not seen a doctor for several years presented with progressive weakness, altered mental status, difficulty urination and poor urine output. He was found to have acute kidney injury with uremic acidosis, bilateral hydronephrosis with bladder outlet obstruction secondary to enlarged prostate. Patient also reported using NSAIDs. A Foley catheter was placed and started on IV fluids. On admission he was also septic with fever, leukocytosis and tachycardia secondary to UTI. Initially he drained 2 L of urine after Foley placed but then no urine output since. Patient was evaluated by renal and is to catheter placed on 06/16/2015 to initiate dialysis. So far he has had for HD treatments. Repeat CT and ultrasound of the kidneys still show persistent dilatation of the collecting system. No clear source of obstruction.  encephalopathy persists.  Assessment/Plan: Acute kidney injury Initially thought to be due to bilateral hydronephrosis. Could have underlying ATN (dehydration versus NSAIDs use),  versus acute on chronic kidney injury since duration of renal function worsening is not known. Patient now getting hemodialysis with temporary catheter placed in. -Seen by urology for bladder outlet obstruction and recommends that surgical intervention will not help with his acute kidney injury. -Continue hemodialysis as planned by renal. May need repeat imaging later this week to evaluate for any improvement.  Acute encephalopathy Head CT negative for acute findings. Unclear if this is purely due to uremia. UTI on admission seems to have been controlled with antibiotics. 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 Avoid narcotics and/or benzodiazepine.  Ammonia level  was elevated. Has normal LFTs. HIV antibody negative. RPR reactive with positive deep palate and antibodies. Dr. Cena Benton spoke with ID who recommended the patient unlikely has neurosyphilis and recommended to treat for latent syphilis with benzathine penicillin once a week for 3 weeks.  Leukocytosis Presented with WBC of almost 30 K. Blood and urine culture without any growth. No diarrhea or any other source of infection. WBC of 20 K today. Afebrile.  continue to monitor.  Bladder stones with sludge Continue irrigation of Foley. Has a large complex renal cyst. Cannot test for malignancy and will need workup once stable.  Rt liver lobe mass 3.8 cm  ? Malignancy.  hepatitis panel was negative. Check AFP.   Bright red blood per rectum On admission. No further episodes. Improving stable.   Severe protein calorie malnutrition Continue supplement.  DVT prophylaxis  Diet: Renal     Code Status: Full code Family Communication: None at bedside Disposition Plan: Currently inpatient.   Consultants:  Renal  Urology  Procedures:  Temporary HD catheter placement on 2/16  Head CT  CT abdomen and pelvis  Ultrasound kidney  Antibiotics:  IV rocephin  HPI/Subjective: Seen and examined. Patient remains confused and is not able to provide much history.  Objective: Filed Vitals:   06/22/15 0619 06/22/15 0739  BP: 134/59 125/62  Pulse: 111 110  Temp: 97.4 F (36.3 C) 97.7 F (36.5 C)  Resp: 19 18    Intake/Output Summary (Last 24 hours) at 06/22/15 1521 Last data filed at 06/22/15 0900  Gross per 24 hour  Intake    120 ml  Output      0 ml  Net    120 ml   Filed Weights   06/19/15 0000 06/21/15 0650 06/21/15 1123  Weight: 78.9 kg (173 lb 15.1 oz) 84.1 kg (185 lb 6.5 oz) 81.6 kg (179 lb 14.3 oz)    Exam:   General: Elderly thin built male lying in bed poorly communicative  HEENT: No pallor, moist mucosa  Chest: Clear bilaterally   CVS: Normal S1 and S2, no  murmurs rub or gallop  Cardiovascular: Normal S1 and S2,  GI: Soft, nondistended, nontender, bowel sounds present, Foley in place  Musculoskeletal: Warm, no edema,   CNS: Awake to commands, poorly communicative, oriented to self and is nearly to place  Data Reviewed: Basic Metabolic Panel:  Recent Labs Lab 06/18/15 1119 06/19/15 1015 06/19/15 1530 06/20/15 0520 06/21/15 0706 06/22/15 0613  NA 135 139 137 136 136 136  K 3.5 4.0 3.9 3.9 4.2 4.9  CL 96* 99* 98* 99* 98* 97*  CO2 GLUCOSE 151* 110* 186* 195* 108* 87  BUN 72* 36* 41* 47* 63* 38*  CREATININE 9.44* 6.58* 7.43* 8.63* 10.65* 7.85*  CALCIUM 7.3* 7.6* 7.7* 7.6* 7.9* 7.7*  PHOS 5.3* 3.8  --  5.1* 6.0* 6.3*   Liver Function Tests:  Recent Labs Lab 06/19/15 1015 06/19/15 1530 06/20/15 0520 06/21/15 0706 06/22/15 0613  AST  --  38  --   --   --   ALT  --  32  --   --   --   ALKPHOS  --  90  --   --   --   BILITOT  --  0.3  --   --   --   PROT  --  6.3*  --   --   --   ALBUMIN 1.6* 1.5* 1.5* 1.5* 1.5*   No results for input(s): LIPASE, AMYLASE in the last 168 hours.  Recent Labs Lab 06/19/15 1530  AMMONIA 45*   CBC:  Recent Labs Lab 06/17/15 1511 06/18/15 1119 06/19/15 0445 06/20/15 0520 06/21/15 0707 06/22/15 0614  WBC 30.9* 26.2* 25.6* 26.8* 26.3* 20.9*  NEUTROABS 27.8*  --   --   --   --   --   HGB 10.1* 9.5* 10.2* 9.5* 9.1* 8.9*  HCT 29.7* 29.1* 31.4* 29.2* 28.1* 26.7*  MCV 81.6 82.9 84.0 84.6 83.9 85.6  PLT 307 243 199 191 205 205   Cardiac Enzymes: No results for input(s): CKTOTAL, CKMB, CKMBINDEX, TROPONINI in the last 168 hours. BNP (last 3 results) No results for input(s): BNP in the last 8760 hours.  ProBNP (last 3 results) No results for input(s): PROBNP in the last 8760 hours.  CBG: No results for input(s): GLUCAP in the last 168 hours.  Recent Results (from the past 240 hour(s))  Culture, blood (x 2)     Status: None   Collection Time: 06/13/15 11:03 PM   Result Value Ref Range Status   Specimen Description BLOOD LEFT ARM  Final   Special Requests BOTTLES DRAWN AEROBIC AND ANAEROBIC 5 CC  Final   Culture   Final    NO GROWTH 5 DAYS Performed at [facility]    Report Status 06/19/2015 FINAL  Final  Culture, blood (x 2)     Status: None   Collection Time: 06/13/15 11:04 PM  Result Value Ref Range Status   Specimen Description BLOOD RIGHT HAND  Final   Special Requests BOTTLES DRAWN AEROBIC AND ANAEROBIC 5 CC  Final   Culture   Final    NO GROWTH 5 DAYS Performed at [facility]    Report  Status 06/19/2015 FINAL  Final  Culture, Urine     Status: None   Collection Time: 06/14/15  7:41 AM  Result Value Ref Range Status   Specimen Description URINE, RANDOM  Final   Special Requests NONE  Final   Culture   Final    NO GROWTH 1 DAY Performed at [facility]    Report Status 06/15/2015 FINAL  Final  Culture, blood (Routine X 2) w Reflex to ID Panel     Status: None (Preliminary result)   Collection Time: 06/19/15  3:06 PM  Result Value Ref Range Status   Specimen Description BLOOD RIGHT ANTECUBITAL  Final   Special Requests BOTTLES DRAWN AEROBIC ONLY 5CC  Final   Culture NO GROWTH 3 DAYS  Final   Report Status PENDING  Incomplete  Culture, blood (Routine X 2) w Reflex to ID Panel     Status: None (Preliminary result)   Collection Time: 06/19/15  3:12 PM  Result Value Ref Range Status   Specimen Description BLOOD LEFT ANTECUBITAL  Final   Special Requests BOTTLES DRAWN AEROBIC ONLY 5CC  Final   Culture NO GROWTH 3 DAYS  Final   Report Status PENDING  Incomplete     Studies: No results found.  Scheduled Meds: . calcium acetate  667 mg Oral TID WC  . darbepoetin (ARANESP) injection - DIALYSIS  60 mcg Intravenous Q Tue-HD  . docusate sodium  100 mg Oral BID  . feeding supplement (NEPRO CARB STEADY)  237 mL Oral BID BM  . multivitamin  1 tablet Oral QHS  . penicillin G benzathine-penicillin G procaine   1.2 Million Units Intramuscular Weekly   Continuous Infusions:      Time spent: 25 minutes    Eddie North  [facility] Pager [phone] If 7PM-7AM, please contact night-coverage at [email or web address], password [facility] 06/22/2015, 3:21 PM  LOS: 9 days

## 2015-06-22 NOTE — Progress Notes (Signed)
Subjective:  S/p HD yesterday - removed 1500 Using Temp R IJ cath (placed 2/16) and Still with essentially no UOP - really cannot comprehend what is happening here  Objective Vital signs in last 24 hours: Filed Vitals:   06/21/15 1800 06/21/15 2023 06/22/15 0619 06/22/15 0739  BP: 101/57 106/53 134/59 125/62  Pulse: 116 111 111 110  Temp: 98.2 F (36.8 C) 97.6 F (36.4 C) 97.4 F (36.3 C) 97.7 F (36.5 C)  TempSrc: Oral Oral Oral Axillary  Resp: Height:      Weight:      SpO2: 98% 93% 96% 97%   Weight change: -2.5 kg (-5 lb 8.2 oz)  Physical Exam: General: [name] is restless - Heart:rrrr, no rub, mur galop  Lungs; CTA somewhat poor resp effort/ non labored breathing Abdomen: Soft/ improved right lower quadrant tenderness to deep palp/ ND Extremities: No Pedal edema Dialysis Access: Right jugular trialysis catheter ( inserted 2/16)  2/19 Renal ultrasound to followup hydro: Length: 12.6 cm. Normal echogenicity. Stable dilatation of the collecting system and previously described cysts. Left Kidney: Length: 12.7 cm. Normal echogenicity. Stable dilatation of the collecting system. Stable large complex cyst   06/19/15  CT ABD=IMPRESSION:1. Limited examination due to the lack of intravenous and oralcontrast and due to streak artifacts produced by the patient's arms. 2. 13.2 cm complex left renal cyst with surrounding soft tissue stranding. This could represent an infected cyst. A cystic neoplasm is also possible. 3. Followup 0.1 cm minimally complex right renal cyst. 4. Mild to moderate bilateral hydronephrosis and bilateral hydroureter wrist new levels of the ureterovesical junctions with no obstructing calculus or mass seen. 5. Multiple bladder calculi. 6. Moderately enlarged prostate gland. 7. Mild diffuse bladder wall thickening, compatible with chronic bladder outlet obstruction. 8. Bilateral pleural effusions and bilateral lower lobe atelectasis. 9.  Cholelithiasis. 10. 3.8 cm right lobe liver mass. This may represent a complicated cyst or solid mass. 11. Small supraumbilical hernia containing fat.  Problem /Plan;  1. Renal failure with bilateral hydronephrosis on initial [place] (and was also taking NSAIDS) Unknown chronicity as no previous information on kidney function. Foley placed and initially 2L urine in the bladder (painless bladder distension) , but no UOP since foley placed.  Temporary HD catheter placed 06/16/15 in IR Dialysis initiated and has had 4 treatments- 2/16, 2/17 and 2/18 and 2/21 due to worsening numbers. Labs are overall  improved but worsening off of HD- will liley need HD tomorrow. Repeated renal ultrasound and CT show the collecting systems of both kidneys are still dilated - unclear if  the foley has not taken care of the obstructive process vs just dilation and not overt obstruction. CT  abd repeated on 2/20- again no overt source of obstruction /  Urology is not optomistic that further intervention will help his AKI.  I am allright just waiting for now and will do u/s again later in week if renal function continues to do poorly. Follow labs and UOP daily for now 2.  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 Pathology/ ?? 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 Leukocytosis - WBC as high as 30,000 without fever, down to 20.9 this am /on rocephin since 2/14, persistent bladder and abd pain. Reculture.as above. CXR yest=Mildly increased left basilar atelectasis with interval mild to moderate  right basilar atelectasis 4.Mild anemia - Hgb decreasing stedily- is 8.9  this am  Start Aranesp 60  q week ly hd / Fe stores adequate.  5.5Bladder sludge/bladder stones - irrigating foley QShift (to make sure lack of urine output isn't related to bladder sludge occluding foley) 6  Large  complex renal cyst - cannot exclude malignancy. Not the cause of his RF but will need eval at some point.. Urology aware 8. MBD - check iPTH - P 5.8>7.1> 6.0  this am   Started phoslo - last Ca   7.6 Corec ca 9.0 9.  Nutrition - Alb 2.0 >1.8>1.5 - Ensure / renal diet - multivit / sl better appetite this am 10.  Dispo- does not seem to be overall improving, especially MS wise now sure why or what baseline really was  - renal failure is not the only issue here but cannot get a true idea of how he would feel being on chronic dialysis at this point due to his mental status    Annie Sable, MD 06/22/2015     Labs: Basic Metabolic Panel:  Recent Labs Lab 06/20/15 0520 06/21/15 0706 06/22/15 0613  NA 136 136 136  K 3.9 4.2 4.9  CL 99* 98* 97*  CO2 GLUCOSE 195* 108* 87  BUN 47* 63* 38*  CREATININE 8.63* 10.65* 7.85*  CALCIUM 7.6* 7.9* 7.7*  PHOS 5.1* 6.0* 6.3*   Liver Function Tests:  Recent Labs Lab 06/19/15 1530 06/20/15 0520 06/21/15 0706 06/22/15 0613  AST 38  --   --   --   ALT 32  --   --   --   ALKPHOS 90  --   --   --   BILITOT 0.3  --   --   --   PROT 6.3*  --   --   --   ALBUMIN 1.5* 1.5* 1.5* 1.5*    Recent Labs Lab 06/15/15 1152  LIPASE 49    Recent Labs Lab 06/19/15 1530  AMMONIA 45*   CBC:  Recent Labs Lab 06/17/15 1511 06/18/15 1119 06/19/15 0445 06/20/15 0520 06/21/15 0707 06/22/15 0614  WBC 30.9* 26.2* 25.6* 26.8* 26.3* 20.9*  NEUTROABS 27.8*  --   --   --   --   --   HGB 10.1* 9.5* 10.2* 9.5* 9.1* 8.9*  HCT 29.7* 29.1* 31.4* 29.2* 28.1* 26.7*  MCV 81.6 82.9 84.0 84.6 83.9 85.6  PLT 307 243 199 191 205 205   Cardiac Enzymes: No results for input(s): CKTOTAL, CKMB, CKMBINDEX, TROPONINI in the last 168 hours. CBG: No results for input(s): GLUCAP in the last 168 hours.  Studies/Results: Medications:   . calcium acetate  667 mg Oral TID WC  . darbepoetin (ARANESP) injection - DIALYSIS  60 mcg Intravenous Q  Tue-HD  . docusate sodium  100 mg Oral BID  . feeding supplement (NEPRO CARB STEADY)  237 mL Oral BID BM  . multivitamin  1 tablet Oral QHS  . penicillin G benzathine-penicillin G procaine  1.2 Million Units Intramuscular Weekly

## 2015-06-22 NOTE — Progress Notes (Addendum)
Patient has been very irritable since the beginning of the shift. Patient also has been tugging and pulling on foley catheter and taking telemetry monitor leads off on multiple occasions. RN and charge RN attempted to place safety mittens on patient and patient became very irritable and resistant. On call NP notified. Orders placed for wrist restraints. Order implemented. Patient's wife has been notified by telephone. RN will continue to monitor patient.   Veatrice Kells, RN

## 2015-06-22 NOTE — Care Management Note (Signed)
Case Management Note  Patient Details  Name: Jeff Parks MRN: [id number] Date of Birth: 1929/05/21  Subjective/Objective:          CM following for progression and d/c planning.          Action/Plan: 06/22/2015 Notified that family requesting SNF placement at the time of D/C. CSW notified.   Expected Discharge Date:   (unknown)               Expected Discharge Plan:  Skilled Nursing Facility  In-House Referral:  Clinical Social Work  Discharge planning Services  CM Consult  Post Acute Care Choice:  NA Choice offered to:  NA  DME Arranged:  N/A DME Agency:  NA  HH Arranged:  NA HH Agency:  NA  Status of Service:  In process, will continue to follow  Medicare Important Message Given:  Yes Date Medicare IM Given:    Medicare IM give by:    Date Additional Medicare IM Given:    Additional Medicare Important Message give by:     If discussed at Long Length of Stay Meetings, dates discussed:    Additional Comments:  Starlyn Skeans, RN 06/22/2015, 10:26 AM

## 2015-06-22 NOTE — Progress Notes (Deleted)
Patient resting O2 Sat 98% on room air. Patient ambulated though the hall had an O2 sat 94% HR went up to 127.  Tirth Cothron A Sydna Brodowski, RN 06/22/2015 4:23 PM            

## 2015-06-22 NOTE — Progress Notes (Signed)
Physical Therapy Treatment Patient Details Name: Jeff Parks MRN: [id number] DOB: 1929/11/25 Today's Date: 06/22/2015    History of Present Illness Jeff Parks is a 82 y.o. male with no previous medical problems who presents to the ED with complaints of Urinary retention x 3- 4 days. He reports not being able to urinate for the past 12 hours. He began to have fever today as well. He was evaluated in the ED, and a foley catheter was placed in the ED, and his BUN/Cr was elevated, and a Renal US was ordered and was admitted. Dx: Renal failure, urinary retention, Fever, anorexia, anemia, leukocytosis - due to early sepsis, HTN.    PT Comments    Patient seen for mobility progression. Patient incontinent of stool upon arrival, assisted with bed mobility and pericare/hygiene. Patient with some continued confusion during session. Ambulated in halls with increased physical assist and use of RW. Current POC remains appropriate. Will continue to see and progress as tolerated.   Follow Up Recommendations  SNF;Supervision/Assistance - 24 hour     Equipment Recommendations  None recommended by PT    Recommendations for Other Services       Precautions / Restrictions Precautions Precautions: Fall Restrictions Weight Bearing Restrictions: No    Mobility  Bed Mobility Overal bed mobility: Needs Assistance;+2 for physical assistance Bed Mobility: Rolling;Sidelying to Sit Rolling: Max assist Sidelying to sit: Max assist;+2 for physical assistance       General bed mobility comments: Patient with some confusion impacting bed mobility. Increased physical assist required to roll in each direction. Pericare and hygiene performed secondary to incontinence. Patient with difficulty following commands in this activity.  Increased assist to elevate trunk to upright at EOB  Transfers Overall transfer level: Needs assistance Equipment used: Rolling walker (2 wheeled) Transfers: Sit to/from  Stand Sit to Stand: Mod assist;+2 physical assistance         General transfer comment: 2 person moderate assist to elevate to standing with use of RW. Modified hand placement with assist for control of RW and stability. Patient required count down to perform task.  Ambulation/Gait Ambulation/Gait assistance: Mod assist;+2 physical assistance Ambulation Distance (Feet): 80 Feet (required chair follow) Assistive device: Rolling walker (2 wheeled) Gait Pattern/deviations: Step-through pattern;Decreased stride length;Shuffle;Scissoring;Drifts right/left;Trunk flexed Gait velocity: slow Gait velocity interpretation: <1.8 ft/sec, indicative of risk for recurrent falls General Gait Details: Required VCs and tactile cues for posture and stride. Moderate assist to maintain balance and upright positioning. Assist for stabilizing RW especially during turns. Noted several incidence of scissoring gait with patient stepping on his own feet   Stairs            Wheelchair Mobility    Modified Rankin (Stroke Patients Only)       Balance Overall balance assessment: Needs assistance Sitting-balance support: Bilateral upper extremity supported Sitting balance-Leahy Scale: Fair Sitting balance - Comments: tolerated EOB ~3 (attempted LE exercise, pt cognition limiting participation)   Standing balance support: Bilateral upper extremity supported Standing balance-Leahy Scale: Fair                      Cognition Arousal/Alertness: Lethargic Behavior During Therapy: Flat affect Overall Cognitive Status: Impaired/Different from baseline Area of Impairment: Safety/judgement;Problem solving;Orientation Orientation Level: Disoriented to;Time;Situation Current Attention Level: Focused   Following Commands: Follows one step commands inconsistently;Follows one step commands with increased time Safety/Judgement: Decreased awareness of safety   Problem Solving: Slow processing;Decreased  initiation;Difficulty sequencing;Requires verbal cues;Requires tactile cues General Comments:  patient with poor ability to follow commande for bed mobility and pericare. Once upright in standing, improvements noted.    Exercises      General Comments General comments (skin integrity, edema, etc.): incontinence of bowels in bed, hygiene and pericare performed      Pertinent Vitals/Pain      Home Living                      Prior Function            PT Goals (current goals can now be found in the care plan section) Acute Rehab PT Goals Patient Stated Goal: none stated PT Goal Formulation: With family Time For Goal Achievement: 06/29/15 Potential to Achieve Goals: Good Progress towards PT goals: Progressing toward goals    Frequency  Min 3X/week    PT Plan Current plan remains appropriate    Co-evaluation             End of Session Equipment Utilized During Treatment: Gait belt Activity Tolerance: Patient limited by lethargy Patient left: in chair;with call bell/phone within reach;with chair alarm set     Time: 5621-3086 PT Time Calculation (min) (ACUTE ONLY): 24 min  Charges:  $Gait Training: 8-22 mins $Therapeutic Activity: 8-22 mins                    G CodesFabio Asa 06-25-2015, 10:59 AM Charlotte Crumb, PT DPT  [phone]

## 2015-06-22 NOTE — Progress Notes (Signed)
Bicillin-CR IM injection scheduled to be given this shift.  Patient's family member requested for RN to administer injection during daylight hours when patient is more awake.  Rescheduled dose for 0800 this morning.

## 2015-06-23 ENCOUNTER — Inpatient Hospital Stay (HOSPITAL_COMMUNITY): Payer: Medicare Other

## 2015-06-23 LAB — BASIC METABOLIC PANEL
ANION GAP: 14 (ref 5–15)
BUN: 57 mg/dL — ABNORMAL HIGH (ref 6–20)
CO2: 24 mmol/L (ref 22–32)
Calcium: 7.7 mg/dL — ABNORMAL LOW (ref 8.9–10.3)
Chloride: 98 mmol/L — ABNORMAL LOW (ref 101–111)
Creatinine, Ser: 10.12 mg/dL — ABNORMAL HIGH (ref 0.61–1.24)
GFR calc Af Amer: 5 mL/min — ABNORMAL LOW (ref >60)
GFR, EST NON AFRICAN AMERICAN: 4 mL/min — AB (ref >60)
GLUCOSE: 110 mg/dL — AB (ref 65–99)
POTASSIUM: 4.7 mmol/L (ref 3.5–5.1)
Sodium: 136 mmol/L (ref 135–145)

## 2015-06-23 LAB — CBC WITH DIFFERENTIAL/PLATELET
BASOS PCT: 0 %
Basophils Absolute: 0 10*3/uL (ref 0.0–0.1)
EOS ABS: 0 10*3/uL (ref 0.0–0.7)
Eosinophils Relative: 0 %
HCT: 26.8 % — ABNORMAL LOW (ref 39.0–52.0)
Hemoglobin: 9.1 g/dL — ABNORMAL LOW (ref 13.0–17.0)
Lymphocytes Relative: 9 %
Lymphs Abs: 1.6 10*3/uL (ref 0.7–4.0)
MCH: 29.1 pg (ref 26.0–34.0)
MCHC: 34 g/dL (ref 30.0–36.0)
MCV: 85.6 fL (ref 78.0–100.0)
MONO ABS: 0.9 10*3/uL (ref 0.1–1.0)
Monocytes Relative: 5 %
NEUTROS ABS: 15.7 10*3/uL — AB (ref 1.7–7.7)
Neutrophils Relative %: 86 %
Platelets: 240 10*3/uL (ref 150–400)
RBC: 3.13 MIL/uL — ABNORMAL LOW (ref 4.22–5.81)
RDW: 14.2 % (ref 11.5–15.5)
WBC: 18.2 10*3/uL — ABNORMAL HIGH (ref 4.0–10.5)

## 2015-06-23 MED ORDER — PENICILLIN G BENZATHINE & PROC 900000-300000 UNIT/2ML IM SUSP
1.2000 10*6.[IU] | INTRAMUSCULAR | Status: DC
  Administered 2015-06-23: 1.2 10*6.[IU] via INTRAMUSCULAR
  Filled 2015-06-23: qty 2

## 2015-06-23 MED ORDER — HEPARIN SODIUM (PORCINE) 1000 UNIT/ML DIALYSIS: 20.0000 [IU]/kg | INTRAMUSCULAR | Status: DC | PRN

## 2015-06-23 MED ORDER — HEPARIN SODIUM (PORCINE) 1000 UNIT/ML IJ SOLN
2000.0000 [IU] | Freq: Once | INTRAMUSCULAR | Status: AC
  Administered 2015-06-23: 2000 [IU] via INTRAVENOUS

## 2015-06-23 NOTE — Procedures (Signed)
Patient was seen on dialysis and the procedure was supervised.  BFR 350  Via vascath BP is  110/70.   Patient appears to be tolerating treatment well  Jeff Parks A 06/23/2015

## 2015-06-23 NOTE — Progress Notes (Signed)
[facility] PROGRESS NOTE  Jeff Parks ZOX:[id number] DOB: November 01, 1929 DOA: 06/13/2015 PCP: No primary care provider on file.  brief narrative 82 year old male with no known past medical history, has not seen a doctor for several years presented with progressive weakness, altered mental status, difficulty urination and poor urine output. He was found to have acute kidney injury with uremic acidosis, bilateral hydronephrosis with bladder outlet obstruction secondary to enlarged prostate. Patient also reported using NSAIDs. A Foley catheter was placed and started on IV fluids. On admission he was also septic with fever, leukocytosis and tachycardia secondary to UTI. Initially he drained 2 L of urine after Foley placed but then no urine output since. Patient was evaluated by renal and is to catheter placed on 06/16/2015 to initiate dialysis. So far he has had for HD treatments. Repeat CT and ultrasound of the kidneys still show persistent dilatation of the collecting system. No clear source of obstruction.  encephalopathy persists.  Assessment/Plan: Acute kidney injury Initially thought to be due to bilateral hydronephrosis. Could have underlying ATN (dehydration versus NSAIDs use),  versus acute on chronic kidney injury since duration of renal function worsening is not known. Patient now getting hemodialysis with temporary catheter placed in. -Seen by urology for bladder outlet obstruction and recommends that surgical intervention will not help with his acute kidney injury. -Continue hemodialysis as planned by renal. May need repeat imaging later this week to evaluate for any improvement.  Acute encephalopathy Head CT negative for acute findings. Unclear if this is purely due to uremia. UTI on admission seems to have been controlled with antibiotics. 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 Avoid narcotics and/or benzodiazepine.  Ammonia level  was elevated. Has normal LFTs. HIV antibody negative. RPR reactive with positive deep palate and antibodies. Dr. Cena Benton spoke with ID who recommended the patient unlikely has neurosyphilis and recommended to treat for latent syphilis with benzathine penicillin once a week for 3 weeks. -Seen is poorly communicative but it oriented today during my interaction with him. 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 Blood and urine culture without any growth. No diarrhea or any other source of infection. WBC of 20 K today. Afebrile.  continue to monitor.  Bladder stones with sludge Continue irrigation of Foley. Has a large complex renal cyst. Cannot test for malignancy right now and will need workup once stable.  Rt liver lobe mass 3.8 cm  ? Malignancy.  hepatitis panel was negative. Check AFP.   Bright red blood per rectum On admission. No further episodes. Improving stable.   Severe protein calorie malnutrition Continue supplement.  DVT prophylaxis  Diet: Renal     Code Status: Full code Family Communication: None at bedside Disposition Plan: Currently inpatient.   Consultants:  Renal  Urology  Procedures:  Temporary HD catheter placement on 2/16  Head CT  CT abdomen and pelvis  Ultrasound kidney  Antibiotics:  IV rocephin ( completed)  Benzathine penicillin once a week for 3 weeks  HPI/Subjective: Seen and examined at dialysis. Patient better oriented today but hesitant to  participate in conversation  Objective: Filed Vitals:   06/23/15 1100 06/23/15 1145  BP: 115/72 121/62  Pulse: 115 119  Temp: 98.1 F (36.7 C) 98.4 F (36.9 C)  Resp: 26 20    Intake/Output Summary (Last  24 hours) at 06/23/15 1331 Last data filed at 06/23/15 0600  Gross per 24 hour  Intake    130 ml  Output    150 ml  Net    -20 ml   Filed Weights    06/21/15 1123 06/23/15 0655 06/23/15 1100  Weight: 81.6 kg (179 lb 14.3 oz) 80.7 kg (177 lb 14.6 oz) 79.8 kg (175 lb 14.8 oz)    Exam:   General:  poorly communicative  HEENT:  moist mucosa  Chest: Clear bilaterally   CVS: Normal S1 and S2, no murmurs rub or gallop  Cardiovascular: Normal S1 and S2,  GI: Soft, nondistended, nontender, bowel sounds present, Foley in place  Musculoskeletal: Warm, no edema,   CNS: sleepy, oriented x 2  Data Reviewed: Basic Metabolic Panel:  Recent Labs Lab 06/18/15 1119 06/19/15 1015 06/19/15 1530 06/20/15 0520 06/21/15 0706 06/22/15 0613 06/23/15 0706  NA 135 139 137 136 136 136 136  K 3.5 4.0 3.9 3.9 4.2 4.9 4.7  CL 96* 99* 98* 99* 98* 97* 98*  CO2 GLUCOSE 151* 110* 186* 195* 108* 87 110*  BUN 72* 36* 41* 47* 63* 38* 57*  CREATININE 9.44* 6.58* 7.43* 8.63* 10.65* 7.85* 10.12*  CALCIUM 7.3* 7.6* 7.7* 7.6* 7.9* 7.7* 7.7*  PHOS 5.3* 3.8  --  5.1* 6.0* 6.3*  --    Liver Function Tests:  Recent Labs Lab 06/19/15 1015 06/19/15 1530 06/20/15 0520 06/21/15 0706 06/22/15 0613  AST  --  38  --   --   --   ALT  --  32  --   --   --   ALKPHOS  --  90  --   --   --   BILITOT  --  0.3  --   --   --   PROT  --  6.3*  --   --   --   ALBUMIN 1.6* 1.5* 1.5* 1.5* 1.5*   No results for input(s): LIPASE, AMYLASE in the last 168 hours.  Recent Labs Lab 06/19/15 1530  AMMONIA 45*   CBC:  Recent Labs Lab 06/17/15 1511  06/19/15 0445 06/20/15 0520 06/21/15 0707 06/22/15 0614 06/23/15 0706  WBC 30.9*  < > 25.6* 26.8* 26.3* 20.9* 18.2*  NEUTROABS 27.8*  --   --   --   --   --  15.7*  HGB 10.1*  < > 10.2* 9.5* 9.1* 8.9* 9.1*  HCT 29.7*  < > 31.4* 29.2* 28.1* 26.7* 26.8*  MCV 81.6  < > 84.0 84.6 83.9 85.6 85.6  PLT 307  < > 199 191 205 205 240  < > = values in this interval not displayed. Cardiac Enzymes: No results for input(s): CKTOTAL, CKMB, CKMBINDEX, TROPONINI in the last 168 hours. BNP (last 3  results) No results for input(s): BNP in the last 8760 hours.  ProBNP (last 3 results) No results for input(s): PROBNP in the last 8760 hours.  CBG: No results for input(s): GLUCAP in the last 168 hours.  Recent Results (from the past 240 hour(s))  Culture, blood (x 2)     Status: None   Collection Time: 06/13/15 11:03 PM  Result Value Ref Range Status   Specimen Description BLOOD LEFT ARM  Final   Special Requests BOTTLES DRAWN AEROBIC AND ANAEROBIC 5 CC  Final   Culture   Final    NO GROWTH 5 DAYS Performed at [facility]    Report Status  06/19/2015 FINAL  Final  Culture, blood (x 2)     Status: None   Collection Time: 06/13/15 11:04 PM  Result Value Ref Range Status   Specimen Description BLOOD RIGHT HAND  Final   Special Requests BOTTLES DRAWN AEROBIC AND ANAEROBIC 5 CC  Final   Culture   Final    NO GROWTH 5 DAYS Performed at [facility]    Report Status 06/19/2015 FINAL  Final  Culture, Urine     Status: None   Collection Time: 06/14/15  7:41 AM  Result Value Ref Range Status   Specimen Description URINE, RANDOM  Final   Special Requests NONE  Final   Culture   Final    NO GROWTH 1 DAY Performed at [facility]    Report Status 06/15/2015 FINAL  Final  Culture, blood (Routine X 2) w Reflex to ID Panel     Status: None (Preliminary result)   Collection Time: 06/19/15  3:06 PM  Result Value Ref Range Status   Specimen Description BLOOD RIGHT ANTECUBITAL  Final   Special Requests BOTTLES DRAWN AEROBIC ONLY 5CC  Final   Culture NO GROWTH 3 DAYS  Final   Report Status PENDING  Incomplete  Culture, blood (Routine X 2) w Reflex to ID Panel     Status: None (Preliminary result)   Collection Time: 06/19/15  3:12 PM  Result Value Ref Range Status   Specimen Description BLOOD LEFT ANTECUBITAL  Final   Special Requests BOTTLES DRAWN AEROBIC ONLY 5CC  Final   Culture NO GROWTH 3 DAYS  Final   Report Status PENDING  Incomplete     Studies: No  results found.  Scheduled Meds: . calcium acetate  667 mg Oral TID WC  . darbepoetin (ARANESP) injection - DIALYSIS  60 mcg Intravenous Q Tue-HD  . docusate sodium  100 mg Oral BID  . feeding supplement (NEPRO CARB STEADY)  237 mL Oral BID BM  . multivitamin  1 tablet Oral QHS  . penicillin G benzathine-penicillin G procaine  1.2 Million Units Intramuscular Weekly   Continuous Infusions:      Time spent: 25 minutes    Eddie North  [facility] Pager [phone] If 7PM-7AM, please contact night-coverage at [email or web address], password [facility] 06/23/2015, 1:31 PM  LOS: 10 days

## 2015-06-23 NOTE — Progress Notes (Signed)
Subjective:  Seen on HD- had 150 of urine which is an improvement !  - he is still quite confused and really cannot comprehend what is happening here  Objective Vital signs in last 24 hours: Filed Vitals:   06/23/15 0830 06/23/15 0900 06/23/15 0930 06/23/15 1000  BP: 110/70 128/72 117/69 120/73  Pulse: 113 113 113 114  Temp:      TempSrc:      Resp: Height:      Weight:      SpO2:       Weight change: -0.9 kg (-1 lb 15.8 oz)  Physical Exam: General: [name] is restless - Heart:rrrr, no rub, mur galop  Lungs; CTA somewhat poor resp effort/ non labored breathing Abdomen: Soft/ improved right lower quadrant tenderness to deep palp/ ND Extremities: No Pedal edema Dialysis Access: Right jugular trialysis catheter ( inserted 2/16)  2/19 Renal ultrasound to followup hydro: Length: 12.6 cm. Normal echogenicity. Stable dilatation of the collecting system and previously described cysts. Left Kidney: Length: 12.7 cm. Normal echogenicity. Stable dilatation of the collecting system. Stable large complex cyst   06/19/15  CT ABD=IMPRESSION:1. Limited examination due to the lack of intravenous and oralcontrast and due to streak artifacts produced by the patient's arms. 2. 13.2 cm complex left renal cyst with surrounding soft tissue stranding. This could represent an infected cyst. A cystic neoplasm is also possible. 3. Followup 0.1 cm minimally complex right renal cyst. 4. Mild to moderate bilateral hydronephrosis and bilateral hydroureter wrist new levels of the ureterovesical junctions with no obstructing calculus or mass seen. 5. Multiple bladder calculi. 6. Moderately enlarged prostate gland. 7. Mild diffuse bladder wall thickening, compatible with chronic bladder outlet obstruction. 8. Bilateral pleural effusions and bilateral lower lobe atelectasis. 9. Cholelithiasis. 10. 3.8 cm right lobe liver mass. This may represent a complicated cyst or solid mass. 11. Small  supraumbilical hernia containing fat.  Problem /Plan;  1. Renal failure with bilateral hydronephrosis on initial [place] (and was also taking NSAIDS) Unknown chronicity as no previous information on kidney function. Foley placed and initially 2L urine in the bladder (painless bladder distension) , but no UOP since foley placed- now 150 ccs last 24 hours.  Temporary HD catheter placed 06/16/15 in IR Dialysis initiated and has had 4 treatments- 2/16, 2/17 and 2/18 and 2/21 - on for 5th today due to worsening numbers. Labs are overall  improved but worsening off of HD. Repeated renal ultrasound and CT early in week show the collecting systems of both kidneys are still dilated - unclear if  the foley has not taken care of the obstructive process vs just dilation and not overt obstruction. Urology is not optomistic that further intervention will help his AKI.  I am allright just waiting for now and have ordered another  u/s for tomorrow to help in the decision of whether perc tubes might help. Renal function continues to do poorly. Follow labs and UOP daily for now 2.  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 Pathology/ ?? 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 Leukocytosis - WBC as high as 30,000 without fever, down to 18 this am /on rocephin since 2/14, persistent bladder and abd pain. Reculture.as above. CXR yest=Mildly increased left basilar atelectasis with interval mild to moderate right basilar atelectasis 4.Mild anemia - Hgb  decreasing stedily- is 9.1  this am  Start Aranesp 60  q week ly hd / Fe stores adequate.  5.5Bladder sludge/bladder stones - irrigating foley QShift (to make sure lack of urine output isn't related to bladder sludge occluding foley) 6  Large complex renal cyst - cannot exclude malignancy. Not the cause of his RF but will need eval  at some point.. Urology aware 8. MBD - check iPTH - P 5.8>7.1> 6.0  this am   Started phoslo - last Ca   7.6 Corec ca 9.0 9.  Nutrition - Alb 2.0 >1.8>1.5 - Ensure / renal diet - multivit / sl better appetite this am 10.  Dispo- does not seem to be overall improving, especially MS wise now sure why or what baseline really was  - renal failure is not the only issue here but cannot get a true idea of how he would feel being on chronic dialysis at this point due to his mental status    Annie Sable, MD 06/23/2015     Labs: Basic Metabolic Panel:  Recent Labs Lab 06/20/15 0520 06/21/15 0706 06/22/15 0613 06/23/15 0706  NA 136 136 136 136  K 3.9 4.2 4.9 4.7  CL 99* 98* 97* 98*  CO2 GLUCOSE 195* 108* 87 110*  BUN 47* 63* 38* 57*  CREATININE 8.63* 10.65* 7.85* 10.12*  CALCIUM 7.6* 7.9* 7.7* 7.7*  PHOS 5.1* 6.0* 6.3*  --    Liver Function Tests:  Recent Labs Lab 06/19/15 1530 06/20/15 0520 06/21/15 0706 06/22/15 0613  AST 38  --   --   --   ALT 32  --   --   --   ALKPHOS 90  --   --   --   BILITOT 0.3  --   --   --   PROT 6.3*  --   --   --   ALBUMIN 1.5* 1.5* 1.5* 1.5*   No results for input(s): LIPASE, AMYLASE in the last 168 hours.  Recent Labs Lab 06/19/15 1530  AMMONIA 45*   CBC:  Recent Labs Lab 06/17/15 1511  06/19/15 0445 06/20/15 0520 06/21/15 0707 06/22/15 0614 06/23/15 0706  WBC 30.9*  < > 25.6* 26.8* 26.3* 20.9* 18.2*  NEUTROABS 27.8*  --   --   --   --   --  15.7*  HGB 10.1*  < > 10.2* 9.5* 9.1* 8.9* 9.1*  HCT 29.7*  < > 31.4* 29.2* 28.1* 26.7* 26.8*  MCV 81.6  < > 84.0 84.6 83.9 85.6 85.6  PLT 307  < > 199 191 205 205 240  < > = values in this interval not displayed. Cardiac Enzymes: No results for input(s): CKTOTAL, CKMB, CKMBINDEX, TROPONINI in the last 168 hours. CBG: No results for input(s): GLUCAP in the last 168 hours.  Studies/Results: Medications:   . calcium acetate  667 mg Oral TID WC  .  darbepoetin (ARANESP) injection - DIALYSIS  60 mcg Intravenous Q Tue-HD  . docusate sodium  100 mg Oral BID  . feeding supplement (NEPRO CARB STEADY)  237 mL Oral BID BM  . multivitamin  1 tablet Oral QHS  . penicillin G benzathine-penicillin G procaine  1.2 Million Units Intramuscular Weekly

## 2015-06-23 NOTE — Progress Notes (Signed)
Nutrition Follow-up  DOCUMENTATION CODES:   Severe malnutrition in context of acute illness/injury  INTERVENTION:  Continue Nepro Shake po BID, each supplement provides 425 kcal and 19 grams protein.  Encourage adequate PO intake.   NUTRITION DIAGNOSIS:   Malnutrition related to acute illness as evidenced by moderate depletion of body fat, moderate depletions of muscle mass; ongoing  GOAL:   Patient will meet greater than or equal to 90% of their needs; progressing  MONITOR:   PO intake, Supplement acceptance, Labs, Weight trends, I & O's  REASON FOR ASSESSMENT:   Consult Assessment of nutrition requirement/status  ASSESSMENT:   82 year old male with no significant known past medical history but has not been to the doctor in many many years who presented with progressive weakness, clouded thinking, difficulty urinating and decreased urine output. He also also had hiccups for the last 5 days prior to admission and poor by mouth intake. He denied vomiting. He had some constipation that resolved with stool softeners and resulted in diarrhea.   Meal completion has been varied at 10-80%. Pt currently has Nepro shake ordered and has been consuming most of them. RD to continue with current orders to aid in caloric and protein needs. Pt encouraged to eat his food at meals and to drink his supplements.   Diet Order:  Diet renal with fluid restriction Fluid restriction:: 1200 mL Fluid; Room service appropriate?: Yes; Fluid consistency:: Thin  Skin:  Reviewed, no issues  Last BM:  2/22  Height:   Ht Readings from Last 1 Encounters:  06/14/15 6' (1.829 m)    Weight:   Wt Readings from Last 1 Encounters:  06/23/15 175 lb 14.8 oz (79.8 kg)    Ideal Body Weight:  80.9 kg  BMI:  Body mass index is 23.85 kg/(m^2).  Estimated Nutritional Needs:   Kcal:  2000-2200  Protein:  90-105 grams  Fluid:  1.2 L/day  EDUCATION NEEDS:   Education needs addressed  Roslyn Smiling, MS, RD, LDN Pager # [phone] After hours/ weekend pager # [phone]

## 2015-06-24 ENCOUNTER — Other Ambulatory Visit: Payer: Self-pay | Admitting: Urology

## 2015-06-24 ENCOUNTER — Encounter (HOSPITAL_COMMUNITY): Payer: Self-pay | Admitting: General Surgery

## 2015-06-24 LAB — CULTURE, BLOOD (ROUTINE X 2)
Culture: NO GROWTH
Culture: NO GROWTH

## 2015-06-24 LAB — CBC
HCT: 25.8 % — ABNORMAL LOW (ref 39.0–52.0)
HEMOGLOBIN: 8.5 g/dL — AB (ref 13.0–17.0)
MCH: 27.7 pg (ref 26.0–34.0)
MCHC: 32.9 g/dL (ref 30.0–36.0)
MCV: 84 fL (ref 78.0–100.0)
Platelets: 251 10*3/uL (ref 150–400)
RBC: 3.07 MIL/uL — AB (ref 4.22–5.81)
RDW: 13.9 % (ref 11.5–15.5)
WBC: 15.9 10*3/uL — ABNORMAL HIGH (ref 4.0–10.5)

## 2015-06-24 LAB — AFP TUMOR MARKER: AFP-Tumor Marker: 1.2 ng/mL (ref 0.0–8.3)

## 2015-06-24 LAB — PROTIME-INR
INR: 1.44 (ref 0.00–1.49)
Prothrombin Time: 17.6 seconds — ABNORMAL HIGH (ref 11.6–15.2)

## 2015-06-24 NOTE — Progress Notes (Signed)
Per MD at 1430 I deflated patient's foley balloon to slightly retract then reinsert catheter to reposition and allow for catheter drainage. After repositioning the catheter a milky light brown fluid began to run through the catheter. MD notified. About 350cc drained out then fluid changed to bright red clear fluid.  About 325 cc of red fluid has drained so far. Foley was then irrigated. Will continue to monitor.

## 2015-06-24 NOTE — Consult Note (Signed)
Chief Complaint: bilateral hydronephrosis  Referring Physician:Dr. Corliss Parks  Jeff Parks  HPI: Jeff Parks is an 82 y.o. male who was brought to the ED secondary to 3-4 days of urinary retention and acutely anuric for 12 hours prior to admission. His Cr was elevated on admission.  He had a foley placed with 2L of output initially.  He had a renal US that revealed bilateral hydronephrosis.  Nephrology was consulted due to rising creatinine.  A temp cath was placed and he has undergone 5 rounds of HD so far.  A repeat renal US was obtained that still showed hydronephrosis.  A request was made for PCNs.  In review of his CT scan from 5 days ago, it does appear that his foley is at the level of his prostate.     Past Medical History:  Past Medical History  Diagnosis Date  . Constipation     Past Surgical History: History reviewed. No pertinent past surgical history.  Family History: History reviewed. No pertinent family history.  Social History:  reports that he has never smoked. He does not have any smokeless tobacco history on file. He reports that he does not drink alcohol or use illicit drugs.  Allergies: No Known Allergies  Medications:   Medication List    ASK your doctor about these medications        MULTIVITAMIN ADULT PO  Take 1 tablet by mouth daily.     naproxen sodium 220 MG tablet  Commonly known as:  ANAPROX  Take 400 mg by mouth every 12 (twelve) hours as needed (PAIN).        Please HPI for pertinent positives, otherwise complete 10 system ROS negative.  Mallampati Score: MD Evaluation Airway: WNL Heart: WNL Abdomen: WNL Chest/ Lungs: WNL ASA  Classification: 3 Mallampati/Airway Score: Two  Physical Exam: BP 119/58 mmHg  Pulse 116  Temp(Src) 98.7 F (37.1 C) (Oral)  Resp 18  Ht 6' (1.829 m)  Wt 175 lb 14.8 oz (79.8 kg)  BMI 23.85 kg/m2  SpO2 97% Body mass index is 23.85 kg/(m^2). General: pleasantly confused, WD, WN balck male  who is laying in bed in NAD HEENT: head is normocephalic, atraumatic.  Sclera are noninjected.  PERRL.  Ears and nose without any masses or lesions.  Mouth is pink and moist Heart: regular, rate, and rhythm.  Normal s1,s2. No obvious murmurs, gallops, or rubs noted.  Palpable radial and pedal pulses bilaterally Lungs: CTAB, no wheezes, rhonchi, or rales noted.  Respiratory effort nonlabored Abd: soft, NT, ND, +BS, no masses, hernias, or organomegaly MS: all 4 extremities are symmetrical with no cyanosis, clubbing, or edema. Skin: warm and dry with no masses, lesions, or rashes Psych: Alert but pleasantly confused.   Labs: Results for orders placed or performed during the hospital encounter of 06/13/15 (from the past 48 hour(s))  AFP tumor marker     Status: None   Collection Time: 06/23/15  7:06 AM  Result Value Ref Range   AFP-Tumor Marker 1.2 0.0 - 8.3 ng/mL    Comment: (NOTE) Roche ECLIA methodology Performed At: [facility] [place], [place] [id number] Lindon Romp MD SK:[id number]   CBC with Differential/Platelet     Status: Abnormal   Collection Time: 06/23/15  7:06 AM  Result Value Ref Range   WBC 18.2 (H) 4.0 - 10.5 K/uL   RBC 3.13 (L) 4.22 - 5.81 MIL/uL   Hemoglobin 9.1 (L) 13.0 - 17.0 g/dL  HCT 26.8 (L) 39.0 - 52.0 %   MCV 85.6 78.0 - 100.0 fL   MCH 29.1 26.0 - 34.0 pg   MCHC 34.0 30.0 - 36.0 g/dL   RDW 14.2 11.5 - 15.5 %   Platelets 240 150 - 400 K/uL   Neutrophils Relative % 86 %   Lymphocytes Relative 9 %   Monocytes Relative 5 %   Eosinophils Relative 0 %   Basophils Relative 0 %   Neutro Abs 15.7 (H) 1.7 - 7.7 K/uL   Lymphs Abs 1.6 0.7 - 4.0 K/uL   Monocytes Absolute 0.9 0.1 - 1.0 K/uL   Eosinophils Absolute 0.0 0.0 - 0.7 K/uL   Basophils Absolute 0.0 0.0 - 0.1 K/uL   RBC Morphology TARGET CELLS    WBC Morphology TOXIC GRANULATION   Basic metabolic panel     Status: Abnormal   Collection Time: 06/23/15  7:06 AM  Result Value  Ref Range   Sodium 136 135 - 145 mmol/L   Potassium 4.7 3.5 - 5.1 mmol/L   Chloride 98 (L) 101 - 111 mmol/L   CO2 24 22 - 32 mmol/L   Glucose, Bld 110 (H) 65 - 99 mg/dL   BUN 57 (H) 6 - 20 mg/dL   Creatinine, Ser 10.12 (H) 0.61 - 1.24 mg/dL   Calcium 7.7 (L) 8.9 - 10.3 mg/dL   GFR calc non Af Amer 4 (L) >60 mL/min   GFR calc Af Amer 5 (L) >60 mL/min    Comment: (NOTE) The eGFR has been calculated using the CKD EPI equation. This calculation has not been validated in all clinical situations. eGFR's persistently <60 mL/min signify possible Chronic Kidney Disease.    Anion gap 14 5 - 15  CBC     Status: Abnormal   Collection Time: 06/24/15  5:30 AM  Result Value Ref Range   WBC 15.9 (H) 4.0 - 10.5 K/uL   RBC 3.07 (L) 4.22 - 5.81 MIL/uL   Hemoglobin 8.5 (L) 13.0 - 17.0 g/dL   HCT 25.8 (L) 39.0 - 52.0 %   MCV 84.0 78.0 - 100.0 fL   MCH 27.7 26.0 - 34.0 pg   MCHC 32.9 30.0 - 36.0 g/dL   RDW 13.9 11.5 - 15.5 %   Platelets 251 150 - 400 K/uL    Imaging: US Renal  06/23/2015  CLINICAL DATA:  82 year old male with acute kidney injury and urinary retention. Followup hydronephrosis. EXAM: RENAL / URINARY TRACT ULTRASOUND COMPLETE COMPARISON:  06/18/2015 ultrasound. FINDINGS: Right Kidney: Length: 18.5 cm. Increased renal echogenicity noted. A 12 cm simple appearing right renal cyst is again identified. Mild -moderate right hydronephrosis does not appear significantly changed. Left Kidney: Length: 13.3 cm. A 13 cm complex left renal cyst is again identified. Echogenicity is mildly increased. Mild -moderate left hydronephrosis does not appear significantly changed. Bladder: Debris and calculi within the bladder again noted. IMPRESSION: No significant change in mild -moderate bilateral hydronephrosis. Unchanged 13 cm complex left renal cyst. Debris and calculi within the bladder again noted. Electronically Signed   By: Margarette Canada M.D.   On: 06/23/2015 15:45    Assessment/Plan 1. Bilateral  hydronephrosis -after review, the patient's foley catheter appears to be in or at the level of the prostate and not in the bladder itself.  We have d/w Dr. Clementeen Graham and Dr. Moshe Cipro that this may be the source for his continued renal issues.  We have recommended advancement of his foley to see if this will correct his problem.  If it does not then we can proceed with (B) PCNs this weekend. -PT/INR pending in case we need to proceed over the weekend.  Thank you for this interesting consult.  I greatly enjoyed meeting Jeff Parks and look forward to participating in their care.  A copy of this report was sent to the requesting provider on this date.  Electronically Signed: Henreitta Cea 06/24/2015, 2:23 PM   I spent a total of 20 Minutes    in face to face in clinical consultation, greater than 50% of which was counseling/coordinating care for bilateral hydronephrosis

## 2015-06-24 NOTE — Progress Notes (Signed)
Was called by IR Dr Bonnielee Haff, who reviewed the CT abd from 2/19. The foley appears outside  the enlarged prostate and not into the bladder which is likely contributing to no urinary output. Instructed nurse to deflate the balloon and reinsert foley. If unsuccessful will ask urology to evaluate.

## 2015-06-24 NOTE — Progress Notes (Signed)
PT Cancellation Note  Patient Details Name: Jeff Parks MRN: [id number] DOB: March 19, 1930   Cancelled Treatment:    Reason Eval/Treat Not Completed: Pain limiting ability to participate  Just had cath advanced into bladder with nursing. Pt in pain, wife requests PT be held. Will continue to follow and progress as tolerated.   Berton Mount 06/24/2015, 3:47 PM  [place], [place] [phone]

## 2015-06-24 NOTE — Progress Notes (Signed)
[facility] PROGRESS NOTE  Jeff Parks ZOX:[id number] DOB: Dec 03, 1929 DOA: 06/13/2015 PCP: No primary care provider on file.  brief narrative 82 year old male with no known past medical history, has not seen a doctor for several years presented with progressive weakness, altered mental status, difficulty urination and poor urine output. He was found to have acute kidney injury with uremic acidosis, bilateral hydronephrosis with bladder outlet obstruction secondary to enlarged prostate. Patient also reported using NSAIDs. A Foley catheter was placed and started on IV fluids. On admission he was also septic with fever, leukocytosis and tachycardia secondary to UTI. Initially he drained 2 L of urine after Foley placed but then no urine output since. Patient was evaluated by renal and is to catheter placed on 06/16/2015 to initiate dialysis. So far he has had for HD treatments. Repeat CT and ultrasound of the kidneys still show persistent dilatation of the collecting system. No clear source of obstruction.  encephalopathy persists.  Assessment/Plan: Acute kidney injury Initially thought to be due to bilateral hydronephrosis. Could have underlying ATN (dehydration versus NSAIDs use),  versus acute on chronic kidney injury since duration of renal function worsening is not known. Patient now getting hemodialysis with temporary catheter placed in. -Seen by urology for bladder outlet obstruction and recommends that surgical intervention will not help with his acute kidney injury. -Continue hemodialysis as planned by renal. Plan to consult urology again to see if PCN may be necessary.   Acute encephalopathy Head CT negative for acute findings. Unclear if this is purely due to uremia. UTI on admission seems to have been controlled with antibiotics. 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 Avoid narcotics and/or benzodiazepine.  Ammonia level was  elevated.normal LFTs. HIV antibody negative. RPR reactive with positive T pallidum antibodies. Dr. Cena Benton spoke with ID who recommended the patient unlikely has neurosyphilis and recommended to treat for latent syphilis with benzathine penicillin once a week for 3 weeks. -appears more oriented today and interactive. Wife reports he is 80% of baseline.  Leukocytosis Presented with WBC of almost 30 K. Blood and urine culture without any growth. No diarrhea or any other source of infection. improving. Afebrile.  continue to monitor.  Bladder stones with sludge Continue irrigation of Foley. Has a large complex renal cyst. Cannot test for malignancy right now and will need workup once stable.  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 AFP negative. May need biopsy once acute issues resolve.   Bright red blood per rectum On admission. No further episodes. stable.   Severe protein calorie malnutrition Continue supplement.  DVT prophylaxis  Diet: Renal     Code Status: Full code Family Communication: wife at bedside Disposition Plan: Currently inpatient.   Consultants:  Renal  Urology  Procedures:  Temporary HD catheter placement on 2/16  Head CT  CT abdomen and pelvis  Ultrasound kidney  Antibiotics:  IV rocephin ( completed)  Benzathine penicillin once a week for 3 weeks  HPI/Subjective: Seen and examined at dialysis. More oriented today  Objective: Filed Vitals:   06/24/15 0456 06/24/15 1010  BP: 126/66 119/58  Pulse: 112 116  Temp: 98.7 F (37.1 C) 98.7 F (37.1 C)  Resp: 18 18    Intake/Output Summary (Last 24 hours) at 06/24/15 1307 Last data filed at 06/24/15 1100  Gross per 24 hour  Intake    180 ml  Output     25 ml  Net  155 ml   Filed Weights   06/21/15 1123 06/23/15 0655 06/23/15 1100  Weight: 81.6 kg (179 lb 14.3 oz) 80.7 kg (177 lb 14.6 oz) 79.8 kg (175 lb 14.8 oz)    Exam:   General:  poorly  communicative  HEENT:  moist mucosa  Chest: Clear bilaterally   CVS: Normal S1 and S2, no murmurs rub or gallop  Cardiovascular: Normal S1 and S2,  GI: Soft, nondistended, nontender, bowel sounds present, Foley in place  Musculoskeletal: Warm, no edema,   CNS: alert and oriented x 2  Data Reviewed: Basic Metabolic Panel:  Recent Labs Lab 06/18/15 1119 06/19/15 1015 06/19/15 1530 06/20/15 0520 06/21/15 0706 06/22/15 0613 06/23/15 0706  NA 135 139 137 136 136 136 136  K 3.5 4.0 3.9 3.9 4.2 4.9 4.7  CL 96* 99* 98* 99* 98* 97* 98*  CO2 GLUCOSE 151* 110* 186* 195* 108* 87 110*  BUN 72* 36* 41* 47* 63* 38* 57*  CREATININE 9.44* 6.58* 7.43* 8.63* 10.65* 7.85* 10.12*  CALCIUM 7.3* 7.6* 7.7* 7.6* 7.9* 7.7* 7.7*  PHOS 5.3* 3.8  --  5.1* 6.0* 6.3*  --    Liver Function Tests:  Recent Labs Lab 06/19/15 1015 06/19/15 1530 06/20/15 0520 06/21/15 0706 06/22/15 0613  AST  --  38  --   --   --   ALT  --  32  --   --   --   ALKPHOS  --  90  --   --   --   BILITOT  --  0.3  --   --   --   PROT  --  6.3*  --   --   --   ALBUMIN 1.6* 1.5* 1.5* 1.5* 1.5*   No results for input(s): LIPASE, AMYLASE in the last 168 hours.  Recent Labs Lab 06/19/15 1530  AMMONIA 45*   CBC:  Recent Labs Lab 06/17/15 1511  06/20/15 0520 06/21/15 0707 06/22/15 0614 06/23/15 0706 06/24/15 0530  WBC 30.9*  < > 26.8* 26.3* 20.9* 18.2* 15.9*  NEUTROABS 27.8*  --   --   --   --  15.7*  --   HGB 10.1*  < > 9.5* 9.1* 8.9* 9.1* 8.5*  HCT 29.7*  < > 29.2* 28.1* 26.7* 26.8* 25.8*  MCV 81.6  < > 84.6 83.9 85.6 85.6 84.0  PLT 307  < > 191 205 205 240 251  < > = values in this interval not displayed. Cardiac Enzymes: No results for input(s): CKTOTAL, CKMB, CKMBINDEX, TROPONINI in the last 168 hours. BNP (last 3 results) No results for input(s): BNP in the last 8760 hours.  ProBNP (last 3 results) No results for input(s): PROBNP in the last 8760 hours.  CBG: No  results for input(s): GLUCAP in the last 168 hours.  Recent Results (from the past 240 hour(s))  Culture, blood (Routine X 2) w Reflex to ID Panel     Status: None (Preliminary result)   Collection Time: 06/19/15  3:06 PM  Result Value Ref Range Status   Specimen Description BLOOD RIGHT ANTECUBITAL  Final   Special Requests BOTTLES DRAWN AEROBIC ONLY 5CC  Final   Culture NO GROWTH 4 DAYS  Final   Report Status PENDING  Incomplete  Culture, blood (Routine X 2) w Reflex to ID Panel     Status: None (Preliminary result)   Collection Time: 06/19/15  3:12 PM  Result Value Ref Range Status  Specimen Description BLOOD LEFT ANTECUBITAL  Final   Special Requests BOTTLES DRAWN AEROBIC ONLY 5CC  Final   Culture NO GROWTH 4 DAYS  Final   Report Status PENDING  Incomplete     Studies: US Renal  06/23/2015  CLINICAL DATA:  82 year old male with acute kidney injury and urinary retention. Followup hydronephrosis. EXAM: RENAL / URINARY TRACT ULTRASOUND COMPLETE COMPARISON:  06/18/2015 ultrasound. FINDINGS: Right Kidney: Length: 18.5 cm. Increased renal echogenicity noted. A 12 cm simple appearing right renal cyst is again identified. Mild -moderate right hydronephrosis does not appear significantly changed. Left Kidney: Length: 13.3 cm. A 13 cm complex left renal cyst is again identified. Echogenicity is mildly increased. Mild -moderate left hydronephrosis does not appear significantly changed. Bladder: Debris and calculi within the bladder again noted. IMPRESSION: No significant change in mild -moderate bilateral hydronephrosis. Unchanged 13 cm complex left renal cyst. Debris and calculi within the bladder again noted. Electronically Signed   By: Harmon Pier M.D.   On: 06/23/2015 15:45    Scheduled Meds: . calcium acetate  667 mg Oral TID WC  . darbepoetin (ARANESP) injection - DIALYSIS  60 mcg Intravenous Q Tue-HD  . docusate sodium  100 mg Oral BID  . feeding supplement (NEPRO CARB STEADY)  237 mL  Oral BID BM  . multivitamin  1 tablet Oral QHS  . penicillin G benzathine-penicillin G procaine  1.2 Million Units Intramuscular Weekly   Continuous Infusions:      Time spent: 25 minutes    Eddie North  [facility] Pager [phone] If 7PM-7AM, please contact night-coverage at [email or web address], password [facility] 06/24/2015, 1:07 PM  LOS: 11 days

## 2015-06-24 NOTE — Progress Notes (Signed)
Patient ID: Jeff Parks, male   DOB: October 09, 1929, 82 y.o.   MRN: [id number]    Subjective: I was called today by Dr. Kathrene Bongo about Mr. Jeff Parks.  He has been followed by Dr. Patsi Sears since presenting last week with bladder outlet obstruction and renal failure.  He had 2 L drained from his bladder initially but his UOP then decreased to almost nothing the last few days.  He has been receiving dialysis this week.  Renal ultrasonography revealed persistent mild to moderate hydronephrosis bilaterally.  Nursing staff apparently manipulated his catheter this afternoon and he has now produced over 1 L of urine since mid afternoon.  Objective: Vital signs in last 24 hours: Temp:  [98.1 F (36.7 C)-98.7 F (37.1 C)] 98.6 F (37 C) (02/24 2017) Pulse Rate:  [111-117] 117 (02/24 2017) Resp:  [18-20] 18 (02/24 2017) BP: (102-126)/(58-73) 111/59 mmHg (02/24 2017) SpO2:  [94 %-97 %] 96 % (02/24 2017)  Intake/Output from previous day: 02/23 0701 - 02/24 0700 In: 60 [P.O.:60] Out: 25 [Urine:25] Intake/Output this shift: Total I/O In: -  Out: 1 [Stool:1]  Physical Exam:  General: Pt resting comfortably. Back: No CVAT.  Lab Results:  Recent Labs  06/22/15 0614 06/23/15 0706 06/24/15 0530  HGB 8.9* 9.1* 8.5*  HCT 26.7* 26.8* 25.8*   BMET  Recent Labs  06/22/15 0613 06/23/15 0706  NA 136 136  K 4.9 4.7  CL 97* 98*  CO2 24 24  GLUCOSE 87 110*  BUN 38* 57*  CREATININE 7.85* 10.12*  CALCIUM 7.7* 7.7*     Studies/Results: US Renal  06/23/2015  CLINICAL DATA:  82 year old male with acute kidney injury and urinary retention. Followup hydronephrosis. EXAM: RENAL / URINARY TRACT ULTRASOUND COMPLETE COMPARISON:  06/18/2015 ultrasound. FINDINGS: Right Kidney: Length: 18.5 cm. Increased renal echogenicity noted. A 12 cm simple appearing right renal cyst is again identified. Mild -moderate right hydronephrosis does not appear significantly changed. Left Kidney: Length: 13.3 cm. A 13 cm  complex left renal cyst is again identified. Echogenicity is mildly increased. Mild -moderate left hydronephrosis does not appear significantly changed. Bladder: Debris and calculi within the bladder again noted. IMPRESSION: No significant change in mild -moderate bilateral hydronephrosis. Unchanged 13 cm complex left renal cyst. Debris and calculi within the bladder again noted. Electronically Signed   By: Harmon Pier M.D.   On: 06/23/2015 15:45    Assessment/Plan: Renal failure (? Acute vs. Chronic) with bladder outlet obstruction and persistent hydronephrosis bilaterally. - Continue catheter drainage.  Irrigate catheter prn.  He may have clot in bladder possibly vs. debris that may intermittent clog his catheter.  Can place larger Fr catheter if needed to better optimize drainage.  - Bilateral hydronephrosis may be due to recurrent bladder outlet obstruction due to catheter obstruction.  Would be appropriate to hold off on renal drainage with catheter now draining to see if improvement occurs.  If not, and hydronephrosis persist, although the dilation of his collecting system may just be chronic and non-obstructive, would be appropriate to consider bilateral nephrostomy tube placement to optimize drainage and absolutely rule out possibility of dialysis dependent chronic renal failure. - Dr. Patsi Sears to follow up next week to determine definitive treatment of his bladder outlet obstruction.  I will be available this weekend for any further urologic input if needed.   LOS: 11 days   Myalynn Lingle,LES 06/24/2015, 10:20 PM

## 2015-06-24 NOTE — Progress Notes (Signed)
Subjective:   HD yest- removal not recorded- going for 1000- had only 25 of urine recorded  - was told his MS was better- told me it was 1996 when i asked for month- took him quite some time to come up with [facility] then went back to sleep- wife at bedside - she understands he is not himself and is pursuing NHP  Objective Vital signs in last 24 hours: Filed Vitals:   06/23/15 1622 06/23/15 2238 06/24/15 0456 06/24/15 1010  BP: 107/54 102/73 126/66 119/58  Pulse: 118 111 112 116  Temp: 98.5 F (36.9 C) 98.1 F (36.7 C) 98.7 F (37.1 C) 98.7 F (37.1 C)  TempSrc: Oral Oral Oral Oral  Resp: Height:      Weight:      SpO2: 97% 94% 97% 97%   Weight change: -0.9 kg (-1 lb 15.8 oz)  Physical Exam: General: [name] is restless - Heart:rrrr, no rub, mur galop  Lungs; CTA somewhat poor resp effort/ non labored breathing Abdomen: Soft/ improved right lower quadrant tenderness to deep palp/ ND Extremities: No Pedal edema Dialysis Access: Right jugular trialysis catheter ( inserted 2/16)  2/19 Renal ultrasound to followup hydro: Length: 12.6 cm. Normal echogenicity. Stable dilatation of the collecting system and previously described cysts. Left Kidney: Length: 12.7 cm. Normal echogenicity. Stable dilatation of the collecting system. Stable large complex cyst   06/19/15  CT ABD=IMPRESSION:1. Limited examination due to the lack of intravenous and oralcontrast and due to streak artifacts produced by the patient's arms. 2. 13.2 cm complex left renal cyst with surrounding soft tissue stranding. This could represent an infected cyst. A cystic neoplasm is also possible. 3. Followup 0.1 cm minimally complex right renal cyst. 4. Mild to moderate bilateral hydronephrosis and bilateral hydroureter wrist new levels of the ureterovesical junctions with no obstructing calculus or mass seen. 5. Multiple bladder calculi. 6. Moderately enlarged prostate gland. 7. Mild diffuse bladder wall  thickening, compatible with chronic bladder outlet obstruction. 8. Bilateral pleural effusions and bilateral lower lobe atelectasis. 9. Cholelithiasis. 10. 3.8 cm right lobe liver mass. This may represent a complicated cyst or solid mass. 11. Small supraumbilical hernia containing fat.  Problem /Plan;  1. Renal failure with bilateral hydronephrosis on initial [place] (and was also taking NSAIDS) Unknown chronicity as no previous information on kidney function. Foley placed and initially 2L urine in the bladder, but no UOP since foley placed- minimal to no UOP.  TemporaryHD catheter placed 06/16/15 in IR Dialysis initiated and has had 5 treatments- 2/16, 2/17 and 2/18, 2/21 and 2/23- with pre HD creatinine 10- is essentially ESRD at this point.  Repeated renal ultrasound and CT early in week show the collecting systems of both kidneys are still dilated  Follow up  u/s still shows moderate hydro- have called urology to discuss whether perc tubes might help- they said to give it a try so we wil. Otherwise we are committing this man to chronic dialysis therapy- wife seems OK with the ESRD diagnosis- start to [facility] and will need perm access next week- next HD tomorrow   2.  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 Pathology/ ?? 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 Leukocytosis - WBC as high as 30,000 without fever, down  to 15 this am /on rocephin since 2/14, persistent bladder and abd pain. Reculture.as above. CXR yest=Mildly increased left basilar atelectasis with interval mild to moderate right basilar atelectasis 4.Mild anemia - Hgb decreasing stedily- is 9.1  this am  Start Aranesp 60  q week ly hd / Fe stores adequate.  5.Bladder sludge/bladder stones - irrigating foley QShift (to make sure lack of urine  output isn't related to bladder sludge occluding foley)- will need eradication at some point as well  6  Large complex renal cyst - cannot exclude malignancy. Not the cause of his RF but will need eval at some point.. Urology aware 8. MBD - check iPTH - P 5.8>7.1> 6.0>6.3  this am   Started phoslo - last Ca   7.6 Corec ca 9.0 9.  Nutrition - Alb 2.0 >1.8>1.5 - Ensure / renal diet - multivit / sl better appetite this am 10.  Dispo- does not seem to be overall improving, especially MS wise now sure why or what baseline really was  - renal failure is not the only issue here but cannot get a true idea of how he would feel being on chronic dialysis at this point although wife is OK with it   Annie Sable, MD 06/24/2015     Labs: Basic Metabolic Panel:  Recent Labs Lab 06/20/15 0520 06/21/15 0706 06/22/15 0613 06/23/15 0706  NA 136 136 136 136  K 3.9 4.2 4.9 4.7  CL 99* 98* 97* 98*  CO2 GLUCOSE 195* 108* 87 110*  BUN 47* 63* 38* 57*  CREATININE 8.63* 10.65* 7.85* 10.12*  CALCIUM 7.6* 7.9* 7.7* 7.7*  PHOS 5.1* 6.0* 6.3*  --    Liver Function Tests:  Recent Labs Lab 06/19/15 1530 06/20/15 0520 06/21/15 0706 06/22/15 0613  AST 38  --   --   --   ALT 32  --   --   --   ALKPHOS 90  --   --   --   BILITOT 0.3  --   --   --   PROT 6.3*  --   --   --   ALBUMIN 1.5* 1.5* 1.5* 1.5*   No results for input(s): LIPASE, AMYLASE in the last 168 hours.  Recent Labs Lab 06/19/15 1530  AMMONIA 45*   CBC:  Recent Labs Lab 06/17/15 1511  06/20/15 0520 06/21/15 0707 06/22/15 0614 06/23/15 0706 06/24/15 0530  WBC 30.9*  < > 26.8* 26.3* 20.9* 18.2* 15.9*  NEUTROABS 27.8*  --   --   --   --  15.7*  --   HGB 10.1*  < > 9.5* 9.1* 8.9* 9.1* 8.5*  HCT 29.7*  < > 29.2* 28.1* 26.7* 26.8* 25.8*  MCV 81.6  < > 84.6 83.9 85.6 85.6 84.0  PLT 307  < > 191 205 205 240 251  < > = values in this interval not displayed. Cardiac Enzymes: No results for input(s):  CKTOTAL, CKMB, CKMBINDEX, TROPONINI in the last 168 hours. CBG: No results for input(s): GLUCAP in the last 168 hours.  Studies/Results: Medications:   . calcium acetate  667 mg Oral TID WC  . darbepoetin (ARANESP) injection - DIALYSIS  60 mcg Intravenous Q Tue-HD  . docusate sodium  100 mg Oral BID  . feeding supplement (NEPRO CARB STEADY)  237 mL Oral BID BM  . multivitamin  1 tablet Oral QHS  . penicillin G benzathine-penicillin G procaine  1.2 Million Units Intramuscular Weekly

## 2015-06-24 NOTE — Clinical Social Work Note (Signed)
Clinical Social Work Assessment  Patient Details  Name: Jeff Parks MRN: [id number] Date of Birth: 1929/05/05  Date of referral:  06/22/15               Reason for consult:  Facility Placement                Permission sought to share information with:  Other (CSW talked with wife as patient only oriented to self) Permission granted to share information::  No (CSW talked with patient's wife as patient only oriented to self)  Name::     Jeff Parks  Agency::     Relationship::  Wife  Contact Information:  [phone]  Housing/Transportation Living arrangements for the past 2 months:  Single Family Home Source of Information:  Spouse Patient Interpreter Needed:  None Criminal Activity/Legal Involvement Pertinent to Current Situation/Hospitalization:  No - Comment as needed Significant Relationships:  Spouse, Adult Children, Other Family Members Lives with:    Do you feel safe going back to the place where you live?  No (Wife understands that she will be unable to care for patient post-discharge and rehab needed.) Need for family participation in patient care:  Yes (Comment)  Care giving concerns:  Wife expressed concerns with caring for patient at discharge and understands that patient needs ST rehab to get stronger and be safer.   Social Worker assessment / plan:  CSW talked with patient's wife, Jeff Parks regarding discharge planning and recommendation of ST rehab. Wife in agreement and facility search process explained and also talked with wife regarding patient insurance (Medicare A only) and answered questions regarding rehab and LTC placement and payment.  Employment status:  Retired Health and safety inspector:  Medicare (Patient has Medicare A only) PT Recommendations:  Skilled Teacher, early years/pre / Referral to community resources:  Skilled Holiday representative (Wife provided with SNF list for [facility])  Patient/Family's Response to care:  No concerns expressed regarding  patient's care during this hospitalization.  Patient/Family's Understanding of and Emotional Response to Diagnosis, Current Treatment, and Prognosis:  Not discussed.  Emotional Assessment Appearance:  Appears stated age (CSW was able to observe patient when he returned to room., however  had ended conversation with wife when patient returned.) Attitude/Demeanor/Rapport:  Unable to Assess (CSW talked with wife in patient's room, however patient was at a procedure.) Affect (typically observed):  Unable to Assess Orientation:  Oriented to Self Alcohol / Substance use:  Never Used Psych involvement (Current and /or in the community):  No (Comment)  Discharge Needs  Concerns to be addressed:  Discharge Planning Concerns Readmission within the last 30 days:  No Current discharge risk:  None Barriers to Discharge:  No Barriers Identified   Cristobal Goldmann, LCSW 06/24/2015, 5:47 PM

## 2015-06-24 NOTE — Clinical Social Work Placement (Signed)
   CLINICAL SOCIAL WORK PLACEMENT  NOTE  Date:  06/24/2015  Patient Details  Name: Jeff Parks MRN: [id number] Date of Birth: 02/01/1930  Clinical Social Work is seeking post-discharge placement for this patient at the Skilled  Nursing Facility level of care (*CSW will initial, date and re-position this form in  chart as items are completed):  Yes   Patient/family provided with [place] Clinical Social Work Department's list of facilities offering this level of care within the geographic area requested by the patient (or if unable, by the patient's family).  Yes   Patient/family informed of their freedom to choose among providers that offer the needed level of care, that participate in Medicare, Medicaid or managed care program needed by the patient, have an available bed and are willing to accept the patient.  Yes   Patient/family informed of [facility]'s ownership interest in [facility] and [facility], as well as of the fact that they are under no obligation to receive care at these facilities.  PASRR submitted to EDS on       PASRR number received on 06/16/15     Existing PASRR number confirmed on 06/16/15     FL2 transmitted to all facilities in geographic area requested by pt/family on 06/24/15     FL2 transmitted to all facilities within larger geographic area on     Patient informed that his/her managed care company has contracts with or will negotiate with certain facilities, including the following:            Patient/family informed of bed offers received.  Patient chooses bed at       Physician recommends and patient chooses bed at      Patient to be transferred to   on  .  Patient to be transferred to facility by       Patient family notified on   of transfer.  Name of family member notified:        PHYSICIAN Please sign FL2, Please prepare priority discharge summary, including medications     Additional Comment:     _______________________________________________ Cristobal Goldmann, LCSW 06/24/2015, 5:55 PM

## 2015-06-24 NOTE — Care Management Important Message (Signed)
Important Message  Patient Details  Name: Jeff Parks MRN: [id number] Date of Birth: 1929-12-02   Medicare Important Message Given:  Yes    [name] P [name] 06/24/2015, 1:14 PM

## 2015-06-25 DIAGNOSIS — N320 Bladder-neck obstruction: Secondary | ICD-10-CM

## 2015-06-25 LAB — CBC
HCT: 27.3 % — ABNORMAL LOW (ref 39.0–52.0)
HEMOGLOBIN: 8.7 g/dL — AB (ref 13.0–17.0)
MCH: 27.3 pg (ref 26.0–34.0)
MCHC: 31.9 g/dL (ref 30.0–36.0)
MCV: 85.6 fL (ref 78.0–100.0)
Platelets: 284 10*3/uL (ref 150–400)
RBC: 3.19 MIL/uL — ABNORMAL LOW (ref 4.22–5.81)
RDW: 14 % (ref 11.5–15.5)
WBC: 13.8 10*3/uL — AB (ref 4.0–10.5)

## 2015-06-25 LAB — BASIC METABOLIC PANEL
ANION GAP: 14 (ref 5–15)
BUN: 40 mg/dL — ABNORMAL HIGH (ref 6–20)
CALCIUM: 8 mg/dL — AB (ref 8.9–10.3)
CHLORIDE: 99 mmol/L — AB (ref 101–111)
CO2: 23 mmol/L (ref 22–32)
CREATININE: 4.81 mg/dL — AB (ref 0.61–1.24)
GFR calc non Af Amer: 10 mL/min — ABNORMAL LOW (ref >60)
GFR, EST AFRICAN AMERICAN: 12 mL/min — AB (ref >60)
Glucose, Bld: 148 mg/dL — ABNORMAL HIGH (ref 65–99)
Potassium: 4.2 mmol/L (ref 3.5–5.1)
SODIUM: 136 mmol/L (ref 135–145)

## 2015-06-25 NOTE — Progress Notes (Signed)
[facility] PROGRESS NOTE  Freman Lapage ZOX:[id number] DOB: May 08, 1929 DOA: 06/13/2015 PCP: No primary care provider on file.  brief narrative 82 year old male with no known past medical history, has not seen a doctor for several years presented with progressive weakness, altered mental status, difficulty urination and poor urine output. He was found to have acute kidney injury with uremic acidosis, bilateral hydronephrosis with bladder outlet obstruction secondary to enlarged prostate. Patient also reported using NSAIDs. A Foley catheter was placed and started on IV fluids. On admission he was also septic with fever, leukocytosis and tachycardia secondary to UTI. Initially he drained 2 L of urine after Foley placed but then no urine output since. Patient was evaluated by renal and is to catheter placed on 06/16/2015 to initiate dialysis. So far he has had for HD treatments. Repeat CT and ultrasound of the kidneys still show persistent dilatation of the collecting system.  Assessment/Plan: Acute kidney injury Due to bladder outlet obstruction with hydronephrosis. -Repeat abdominal CT done on 2/19 which showed persistent hydronephrosis but failed to report that his Foley catheter was outside the prostate. Foley was repositioned yesterday and patient had muddy brown urine drained (about 2.5 L). Patient urinating adequately. Patient now getting hemodialysis with temporary catheter placed in. -In by urology again and will reevaluate early next week and decide on definitive treatment for his bladder outlet obstruction. -For now patient may not need further dialysis. Creatinine has markedly improved this morning (4.81) -Patient without dialysis catheter last night. No bleeding or complication noted.  Acute encephalopathy Possibly due to uremia. Also has significant leukocytosis with underlying UTI. Has completed antibiotics. Mentation has been improving past 48 hours. Avoid narcotics and/or  benzodiazepine.  Ammonia level was elevated.normal LFTs. HIV antibody negative. RPR reactive with positive T pallidum antibodies. Dr. Cena Benton spoke with ID who recommended the patient unlikely has neurosyphilis and recommended to treat for latent syphilis with benzathine penicillin once a week for 3 weeks.  Wife reports he is 80% of baseline.  Leukocytosis Presented with WBC of almost 30 K. Blood and urine culture without any growth. No diarrhea or any other source of infection. improving. Afebrile.  continue to monitor.  Bladder stones with sludge Continue irrigation of Foley. Has a large complex renal cyst. Needs workup for malignancy as outpatient.   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 AFP negative. Biopsy versus repeat CT scan in 3 months as outpatient.   Bright red blood per rectum On admission. No further episodes. stable.   Severe protein calorie malnutrition Continue supplement.  DVT prophylaxis  Diet: Renal     Code Status: Full code Family Communication: wife at bedside Disposition Plan: Currently inpatient. Home once in the function improves adequately.   Consultants:  Renal  Urology  Procedures:  Temporary HD catheter placement on 2/16  Head CT  CT abdomen and pelvis  Ultrasound kidney  Antibiotics:  IV rocephin ( completed)  Benzathine penicillin once a week for 3 weeks  HPI/Subjective: Seen and examined his morning. Patient pulled out his dialysis catheter last night.  Objective: Filed Vitals:   06/25/15 0352 06/25/15 0941  BP: 108/56 102/61  Pulse: 121 111  Temp: 98.6 F (37 C) 97.9 F (36.6 C)  Resp: 16 16    Intake/Output Summary (Last 24 hours) at 06/25/15 1146 Last data filed at 06/25/15 1100  Gross per 24 hour  Intake    430 ml  Output   2702 ml  Net  -2272 ml  Filed Weights   06/21/15 1123 06/23/15 0655 06/23/15 1100  Weight: 81.6 kg (179 lb 14.3 oz) 80.7 kg (177 lb 14.6 oz) 79.8 kg (175 lb 14.8 oz)     Exam:   General:  Not in distress, oriented  HEENT:  moist mucosa, hemodialysis catheter site is clean with no bleeding  Chest: Clear bilaterally   CVS: Normal S1 and S2, no murmurs rub or gallop  Cardiovascular: Normal S1 and S2,  GI: Soft, nondistended, nontender, bowel sounds present, Foley in place draining dark urine  Musculoskeletal: Warm, no edema,   CNS: alert and oriented x 2  Data Reviewed: Basic Metabolic Panel:  Recent Labs Lab 06/19/15 1015  06/20/15 0520 06/21/15 0706 06/22/15 0613 06/23/15 0706 06/25/15 0932  NA 139  < > 136 136 136 136 136  K 4.0  < > 3.9 4.2 4.9 4.7 4.2  CL 99*  < > 99* 98* 97* 98* 99*  CO2 26  < > GLUCOSE 110*  < > 195* 108* 87 110* 148*  BUN 36*  < > 47* 63* 38* 57* 40*  CREATININE 6.58*  < > 8.63* 10.65* 7.85* 10.12* 4.81*  CALCIUM 7.6*  < > 7.6* 7.9* 7.7* 7.7* 8.0*  PHOS 3.8  --  5.1* 6.0* 6.3*  --   --   < > = values in this interval not displayed. Liver Function Tests:  Recent Labs Lab 06/19/15 1015 06/19/15 1530 06/20/15 0520 06/21/15 0706 06/22/15 0613  AST  --  38  --   --   --   ALT  --  32  --   --   --   ALKPHOS  --  90  --   --   --   BILITOT  --  0.3  --   --   --   PROT  --  6.3*  --   --   --   ALBUMIN 1.6* 1.5* 1.5* 1.5* 1.5*   No results for input(s): LIPASE, AMYLASE in the last 168 hours.  Recent Labs Lab 06/19/15 1530  AMMONIA 45*   CBC:  Recent Labs Lab 06/21/15 0707 06/22/15 0614 06/23/15 0706 06/24/15 0530 06/25/15 0602  WBC 26.3* 20.9* 18.2* 15.9* 13.8*  NEUTROABS  --   --  15.7*  --   --   HGB 9.1* 8.9* 9.1* 8.5* 8.7*  HCT 28.1* 26.7* 26.8* 25.8* 27.3*  MCV 83.9 85.6 85.6 84.0 85.6  PLT 205 205 240 251 284   Cardiac Enzymes: No results for input(s): CKTOTAL, CKMB, CKMBINDEX, TROPONINI in the last 168 hours. BNP (last 3 results) No results for input(s): BNP in the last 8760 hours.  ProBNP (last 3 results) No results for input(s): PROBNP in the last 8760  hours.  CBG: No results for input(s): GLUCAP in the last 168 hours.  Recent Results (from the past 240 hour(s))  Culture, blood (Routine X 2) w Reflex to ID Panel     Status: None   Collection Time: 06/19/15  3:06 PM  Result Value Ref Range Status   Specimen Description BLOOD RIGHT ANTECUBITAL  Final   Special Requests BOTTLES DRAWN AEROBIC ONLY 5CC  Final   Culture NO GROWTH 5 DAYS  Final   Report Status 06/24/2015 FINAL  Final  Culture, blood (Routine X 2) w Reflex to ID Panel     Status: None   Collection Time: 06/19/15  3:12 PM  Result Value Ref Range Status   Specimen Description BLOOD  LEFT ANTECUBITAL  Final   Special Requests BOTTLES DRAWN AEROBIC ONLY 5CC  Final   Culture NO GROWTH 5 DAYS  Final   Report Status 06/24/2015 FINAL  Final     Studies: US Renal  06/23/2015  CLINICAL DATA:  82 year old male with acute kidney injury and urinary retention. Followup hydronephrosis. EXAM: RENAL / URINARY TRACT ULTRASOUND COMPLETE COMPARISON:  06/18/2015 ultrasound. FINDINGS: Right Kidney: Length: 18.5 cm. Increased renal echogenicity noted. A 12 cm simple appearing right renal cyst is again identified. Mild -moderate right hydronephrosis does not appear significantly changed. Left Kidney: Length: 13.3 cm. A 13 cm complex left renal cyst is again identified. Echogenicity is mildly increased. Mild -moderate left hydronephrosis does not appear significantly changed. Bladder: Debris and calculi within the bladder again noted. IMPRESSION: No significant change in mild -moderate bilateral hydronephrosis. Unchanged 13 cm complex left renal cyst. Debris and calculi within the bladder again noted. Electronically Signed   By: Harmon Pier M.D.   On: 06/23/2015 15:45    Scheduled Meds: . calcium acetate  667 mg Oral TID WC  . darbepoetin (ARANESP) injection - DIALYSIS  60 mcg Intravenous Q Tue-HD  . docusate sodium  100 mg Oral BID  . feeding supplement (NEPRO CARB STEADY)  237 mL Oral BID BM  .  multivitamin  1 tablet Oral QHS  . penicillin G benzathine-penicillin G procaine  1.2 Million Units Intramuscular Weekly   Continuous Infusions:      Time spent: 25 minutes    Eddie North  [facility] Pager [phone] If 7PM-7AM, please contact night-coverage at [email or web address], password [facility] 06/25/2015, 11:46 AM  LOS: 12 days

## 2015-06-25 NOTE — Progress Notes (Signed)
Subjective:   Quite an eventful last 24 hours-  Was thinking that patient would need perc tubes but then found out that foley not in right place- with adjustment has had great UOP- also he pulled out his vascath :(   Calm but still does not know the year   Objective Vital signs in last 24 hours: Filed Vitals:   06/24/15 1010 06/24/15 2017 06/25/15 0352 06/25/15 0941  BP: 119/58 111/59 108/56 102/61  Pulse: 116 117 121 111  Temp: 98.7 F (37.1 C) 98.6 F (37 C) 98.6 F (37 C) 97.9 F (36.6 C)  TempSrc: Oral Oral Oral Oral  Resp: Height:      Weight:      SpO2: 97% 96% 92% 96%   Weight change:   Physical Exam: General: [name] is restless - Heart:rrrr, no rub, mur galop  Lungs; CTA somewhat poor resp effort/ non labored breathing Abdomen: Soft/ improved right lower quadrant tenderness to deep palp/ ND Extremities: No Pedal edema Dialysis Access: Right jugular trialysis catheter ( inserted 2/16)  2/19 Renal ultrasound to followup hydro: Length: 12.6 cm. Normal echogenicity. Stable dilatation of the collecting system and previously described cysts. Left Kidney: Length: 12.7 cm. Normal echogenicity. Stable dilatation of the collecting system. Stable large complex cyst   06/19/15  CT ABD=IMPRESSION:1. Limited examination due to the lack of intravenous and oralcontrast and due to streak artifacts produced by the patient's arms. 2. 13.2 cm complex left renal cyst with surrounding soft tissue stranding. This could represent an infected cyst. A cystic neoplasm is also possible. 3. Followup 0.1 cm minimally complex right renal cyst. 4. Mild to moderate bilateral hydronephrosis and bilateral hydroureter wrist new levels of the ureterovesical junctions with no obstructing calculus or mass seen. 5. Multiple bladder calculi. 6. Moderately enlarged prostate gland. 7. Mild diffuse bladder wall thickening, compatible with chronic bladder outlet obstruction. 8. Bilateral pleural  effusions and bilateral lower lobe atelectasis. 9. Cholelithiasis. 10. 3.8 cm right lobe liver mass. This may represent a complicated cyst or solid mass. 11. Small supraumbilical hernia containing fat.  Problem /Plan;  1. Renal failure with bilateral hydronephrosis on initial [place] (and was also taking NSAIDS) Unknown chronicity as no previous information on kidney function. Foley placed and initially 2L urine in the bladder, but no UOP since - then found out foley was in wrong place- with manipulation much UOP again !  TemporaryHD catheter placed 06/16/15 in IR Dialysis on  2/16, 2/17 and 2/18, 2/21 and 2/23- with pre HD creatinine 10- seemed to be at essentially ESRD until this latest revelation. Since over 2 liters of UOP last 24 hours will check labs and hopefully can hold off on HD since has pulled out his access  2.  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 Pathology/ ?? 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 Leukocytosis - WBC as high as 30,000 without fever, down to 14 this am /on rocephin since 2/14, persistent bladder and abd pain. Reculture.as above. CXR yest=Mildly increased left basilar atelectasis with interval mild to moderate right basilar atelectasis 4.Mild anemia - Hgb decreasing stedily- is 9.1  this am  Start Aranesp 60  q week ly hd / Fe stores adequate.  5.Bladder sludge/bladder stones - irrigating foley QShift (to make sure lack of urine output isn't related to  bladder sludge occluding foley)- will need eradication at some point as well  6  Large complex renal cyst - cannot exclude malignancy. Not the cause of his RF but will need eval at some point.. Urology aware 8. MBD - check iPTH - P 5.8>7.1> 6.0>6.3  this am   Started phoslo - last Ca   7.6 Corec ca 9.0 9.  Nutrition - Alb 2.0 >1.8>1.5 - Ensure / renal  diet - multivit / sl better appetite this am 10.  Dispo- does not seem to be overall improving, especially MS wise - will see if his BOO is fixed will help anything   Annie Sable, MD 06/25/2015     Labs: Basic Metabolic Panel:  Recent Labs Lab 06/20/15 0520 06/21/15 0706 06/22/15 0613 06/23/15 0706  NA 136 136 136 136  K 3.9 4.2 4.9 4.7  CL 99* 98* 97* 98*  CO2 GLUCOSE 195* 108* 87 110*  BUN 47* 63* 38* 57*  CREATININE 8.63* 10.65* 7.85* 10.12*  CALCIUM 7.6* 7.9* 7.7* 7.7*  PHOS 5.1* 6.0* 6.3*  --    Liver Function Tests:  Recent Labs Lab 06/19/15 1530 06/20/15 0520 06/21/15 0706 06/22/15 0613  AST 38  --   --   --   ALT 32  --   --   --   ALKPHOS 90  --   --   --   BILITOT 0.3  --   --   --   PROT 6.3*  --   --   --   ALBUMIN 1.5* 1.5* 1.5* 1.5*   No results for input(s): LIPASE, AMYLASE in the last 168 hours.  Recent Labs Lab 06/19/15 1530  AMMONIA 45*   CBC:  Recent Labs Lab 06/21/15 0707 06/22/15 0614 06/23/15 0706 06/24/15 0530 06/25/15 0602  WBC 26.3* 20.9* 18.2* 15.9* 13.8*  NEUTROABS  --   --  15.7*  --   --   HGB 9.1* 8.9* 9.1* 8.5* 8.7*  HCT 28.1* 26.7* 26.8* 25.8* 27.3*  MCV 83.9 85.6 85.6 84.0 85.6  PLT 205 205 240 251 284   Cardiac Enzymes: No results for input(s): CKTOTAL, CKMB, CKMBINDEX, TROPONINI in the last 168 hours. CBG: No results for input(s): GLUCAP in the last 168 hours.  Studies/Results: Medications:   . calcium acetate  667 mg Oral TID WC  . darbepoetin (ARANESP) injection - DIALYSIS  60 mcg Intravenous Q Tue-HD  . docusate sodium  100 mg Oral BID  . feeding supplement (NEPRO CARB STEADY)  237 mL Oral BID BM  . multivitamin  1 tablet Oral QHS  . penicillin G benzathine-penicillin G procaine  1.2 Million Units Intramuscular Weekly

## 2015-06-25 NOTE — Progress Notes (Signed)
At approximately 0330, NT went to patient's room to check patient.  Patient had pulled Right HD cath from neck out.  Catheter was found in the bed linen.  Bleeding had stopped.  Bleeding was noted on pillow behind neck and on sheet under upper back.  However, the area was not drenched.  Pressure dressing applied to area.  He had also pulled securing device from leg securing Foley Catheter tubing.  Catheter continues to drain red urine.  Securing device was reattached.  Dr. Kathrene Bongo was made aware of situation.  No additional orders at this time.  Safety mittens applied to patient's hands.  VS are stable.  Will continue to monitor patient.  [employer] RN-BC, WTA.

## 2015-06-25 NOTE — Clinical Social Work Note (Signed)
Clinical Social Worker has contacted patient's wife, Jerrye Beavers to present bed offers for SNF. Pt's wife to review bed offers and contact CSW once decision is made.   CSW remains available as needed.   Derenda Fennel, MSW, LCSWA [phone] 06/25/2015 12:18 PM

## 2015-06-26 ENCOUNTER — Inpatient Hospital Stay (HOSPITAL_COMMUNITY): Payer: Medicare Other

## 2015-06-26 DIAGNOSIS — J181 Lobar pneumonia, unspecified organism: Secondary | ICD-10-CM

## 2015-06-26 LAB — RENAL FUNCTION PANEL
ANION GAP: 12 (ref 5–15)
Albumin: 1.5 g/dL — ABNORMAL LOW (ref 3.5–5.0)
BUN: 46 mg/dL — ABNORMAL HIGH (ref 6–20)
CHLORIDE: 104 mmol/L (ref 101–111)
CO2: 23 mmol/L (ref 22–32)
CREATININE: 4.61 mg/dL — AB (ref 0.61–1.24)
Calcium: 7.8 mg/dL — ABNORMAL LOW (ref 8.9–10.3)
GFR, EST AFRICAN AMERICAN: 12 mL/min — AB (ref >60)
GFR, EST NON AFRICAN AMERICAN: 11 mL/min — AB (ref >60)
Glucose, Bld: 114 mg/dL — ABNORMAL HIGH (ref 65–99)
Phosphorus: 4.9 mg/dL — ABNORMAL HIGH (ref 2.5–4.6)
Potassium: 4.4 mmol/L (ref 3.5–5.1)
Sodium: 139 mmol/L (ref 135–145)

## 2015-06-26 LAB — CBC
HCT: 26.5 % — ABNORMAL LOW (ref 39.0–52.0)
Hemoglobin: 8.5 g/dL — ABNORMAL LOW (ref 13.0–17.0)
MCH: 27.3 pg (ref 26.0–34.0)
MCHC: 32.1 g/dL (ref 30.0–36.0)
MCV: 85.2 fL (ref 78.0–100.0)
PLATELETS: 325 10*3/uL (ref 150–400)
RBC: 3.11 MIL/uL — AB (ref 4.22–5.81)
RDW: 14 % (ref 11.5–15.5)
WBC: 13.6 10*3/uL — AB (ref 4.0–10.5)

## 2015-06-26 LAB — URINALYSIS, ROUTINE W REFLEX MICROSCOPIC
BILIRUBIN URINE: NEGATIVE
GLUCOSE, UA: NEGATIVE mg/dL
KETONES UR: NEGATIVE mg/dL
Nitrite: NEGATIVE
PH: 5.5 (ref 5.0–8.0)
Protein, ur: 100 mg/dL — AB
SPECIFIC GRAVITY, URINE: 1.014 (ref 1.005–1.030)

## 2015-06-26 LAB — URINE MICROSCOPIC-ADD ON

## 2015-06-26 LAB — CBC AND DIFFERENTIAL: WBC: 13.6 10^3/mL

## 2015-06-26 MED ORDER — DEXTROSE 5 % IV SOLN
1.0000 g | INTRAVENOUS | Status: DC
  Administered 2015-06-26 – 2015-06-28 (×3): 1 g via INTRAVENOUS
  Filled 2015-06-26 (×4): qty 10

## 2015-06-26 MED ORDER — DEXTROSE 5 % IV SOLN
500.0000 mg | Freq: Every day | INTRAVENOUS | Status: DC
  Administered 2015-06-26 – 2015-06-28 (×3): 500 mg via INTRAVENOUS
  Filled 2015-06-26 (×4): qty 500

## 2015-06-26 MED ORDER — SODIUM CHLORIDE 0.9 % IV SOLN
INTRAVENOUS | Status: AC
  Administered 2015-06-26 – 2015-06-27 (×2): via INTRAVENOUS

## 2015-06-26 NOTE — Progress Notes (Signed)
Subjective:   UOP trailing off only 800 recorded last 24 hours - some low BPS- MS the same Objective Vital signs in last 24 hours: Filed Vitals:   06/25/15 0941 06/25/15 1650 06/25/15 2100 06/26/15 0500  BP: 102/61 96/55 110/53 89/60  Pulse: 111 106 120 112  Temp: 97.9 F (36.6 C) 100.7 F (38.2 C) 98.7 F (37.1 C) 98.5 F (36.9 C)  TempSrc: Oral Oral Oral Oral  Resp: Height:      Weight:      SpO2: 96% 95% 95% 96%   Weight change:   Physical Exam: General: [name] is restless - Heart:rrrr, no rub, mur galop  Lungs; CTA somewhat poor resp effort/ non labored breathing Abdomen: Soft/ improved right lower quadrant tenderness to deep palp/ ND Extremities: No Pedal edema Dialysis Access: Right jugular trialysis catheter ( inserted 2/16)  2/19 Renal ultrasound to followup hydro: Length: 12.6 cm. Normal echogenicity. Stable dilatation of the collecting system and previously described cysts. Left Kidney: Length: 12.7 cm. Normal echogenicity. Stable dilatation of the collecting system. Stable large complex cyst   06/19/15  CT ABD=IMPRESSION:1. Limited examination due to the lack of intravenous and oralcontrast and due to streak artifacts produced by the patient's arms. 2. 13.2 cm complex left renal cyst with surrounding soft tissue stranding. This could represent an infected cyst. A cystic neoplasm is also possible. 3. Followup 0.1 cm minimally complex right renal cyst. 4. Mild to moderate bilateral hydronephrosis and bilateral hydroureter wrist new levels of the ureterovesical junctions with no obstructing calculus or mass seen. 5. Multiple bladder calculi. 6. Moderately enlarged prostate gland. 7. Mild diffuse bladder wall thickening, compatible with chronic bladder outlet obstruction. 8. Bilateral pleural effusions and bilateral lower lobe atelectasis. 9. Cholelithiasis. 10. 3.8 cm right lobe liver mass. This may represent a complicated cyst or solid mass. 11.  Small supraumbilical hernia containing fat.  Problem /Plan;  1. Renal failure with bilateral hydronephrosis on initial [place] (and was also taking NSAIDS) Unknown chronicity as no previous information on kidney function. Foley placed and initially 2L urine in the bladder, but then none - then found out foley was in wrong place- with manipulation on 2/24 much UOP again but is trailing off.   TemporaryHD catheter placed 06/16/15 in IR Dialysis on  2/16, 2/17 and 2/18, 2/21 and 2/23- with pre HD creatinine 10- seemed to be at essentially ESRD until this latest revelation. Since making urine BUN and creatinine down to 40 and mid 4's but may be stalled ?  Check labs in AM- pt pulled out his vascath so if needs HD again will need new access  2.  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 Pathology/ ?? 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 Leukocytosis - WBC as high as 30,000 without fever, down to 13 this am /on rocephin since 2/14 4.Mild anemia - Hgb decreasing stedily- is 8.5 this am  Started Aranesp 60  q week ly hd / Fe stores adequate.  5.Bladder sludge/bladder stones - irrigating foley QShift (to make sure lack of urine output isn't related to bladder sludge occluding foley)- will need eradication at some point as well  6  Large complex renal cyst - cannot exclude malignancy. Not the cause of his RF but will need eval at some point.. Urology aware 8. MBD -  check iPTH - P 5.8>7.1> 6.0>6.3>4.9  this am   on phoslo- may be able to stop if kidneys continue to get better - last Ca   7.6 Corec ca 9.0 9.  Nutrition - Alb 2.0 >1.8>1.5 - Ensure / renal diet - multivit / sl better appetite this am 10. New hypotension- no BP meds- since had a lot of UOP last 48 hours will give him a little fluid back  11.  Dispo- does not seem to be overall  improving, especially MS wise - will see if his BOO is fixed will help anything   Jeff Sable, MD 06/26/2015     Labs: Basic Metabolic Panel:  Recent Labs Lab 06/21/15 0706 06/22/15 1610 06/23/15 0706 06/25/15 0932 06/26/15 0622  NA 136 136 136 136 139  K 4.2 4.9 4.7 4.2 4.4  CL 98* 97* 98* 99* 104  CO2 GLUCOSE 108* 87 110* 148* 114*  BUN 63* 38* 57* 40* 46*  CREATININE 10.65* 7.85* 10.12* 4.81* 4.61*  CALCIUM 7.9* 7.7* 7.7* 8.0* 7.8*  PHOS 6.0* 6.3*  --   --  4.9*   Liver Function Tests:  Recent Labs Lab 06/19/15 1530  06/21/15 0706 06/22/15 0613 06/26/15 0622  AST 38  --   --   --   --   ALT 32  --   --   --   --   ALKPHOS 90  --   --   --   --   BILITOT 0.3  --   --   --   --   PROT 6.3*  --   --   --   --   ALBUMIN 1.5*  < > 1.5* 1.5* 1.5*  < > = values in this interval not displayed. No results for input(s): LIPASE, AMYLASE in the last 168 hours.  Recent Labs Lab 06/19/15 1530  AMMONIA 45*   CBC:  Recent Labs Lab 06/22/15 0614 06/23/15 0706 06/24/15 0530 06/25/15 0602 06/26/15 0622  WBC 20.9* 18.2* 15.9* 13.8* 13.6*  NEUTROABS  --  15.7*  --   --   --   HGB 8.9* 9.1* 8.5* 8.7* 8.5*  HCT 26.7* 26.8* 25.8* 27.3* 26.5*  MCV 85.6 85.6 84.0 85.6 85.2  PLT 205 240 251 284 325   Cardiac Enzymes: No results for input(s): CKTOTAL, CKMB, CKMBINDEX, TROPONINI in the last 168 hours. CBG: No results for input(s): GLUCAP in the last 168 hours.  Studies/Results: Medications:   . azithromycin  500 mg Intravenous Daily  . calcium acetate  667 mg Oral TID WC  . cefTRIAXone (ROCEPHIN)  IV  1 g Intravenous Q24H  . darbepoetin (ARANESP) injection - DIALYSIS  60 mcg Intravenous Q Tue-HD  . docusate sodium  100 mg Oral BID  . feeding supplement (NEPRO CARB STEADY)  237 mL Oral BID BM  . multivitamin  1 tablet Oral QHS  . penicillin G benzathine-penicillin G procaine  1.2 Million Units Intramuscular Weekly

## 2015-06-26 NOTE — Progress Notes (Signed)
[facility] PROGRESS NOTE  Schyler Counsell ZOX:[id number] DOB: 04-30-1930 DOA: 06/13/2015 PCP: No primary care provider on file.  brief narrative 82 year old male with no known past medical history, has not seen a doctor for several years presented with progressive weakness, altered mental status, difficulty urination and poor urine output. He was found to have acute kidney injury with uremic acidosis, bilateral hydronephrosis with bladder outlet obstruction secondary to enlarged prostate. Patient also reported using NSAIDs. A Foley catheter was placed and started on IV fluids. On admission he was also septic with fever, leukocytosis and tachycardia secondary to UTI. Initially he drained 2 L of urine after Foley placed but then no urine output since. Patient was evaluated by renal and is to catheter placed on 06/16/2015 to initiate dialysis. So far he has had for HD treatments. Repeat CT and ultrasound of the kidneys still show persistent dilatation of the collecting system.  Assessment/Plan: Acute kidney injury Due to bladder outlet obstruction with hydronephrosis. -Repeat abdominal CT done on 2/19 which showed persistent hydronephrosis but failed to report that his Foley catheter was outside the prostate. Foley was repositioned and had good urine output (only 800 mL documented over past 24 hours). Seen by urology again and will reevaluate this week and decide on definitive treatment for his bladder outlet obstruction. -For now patient may not need further dialysis. Creatinine improving. ( patient pulled out his dialysis catheter on 2/24)  Acute encephalopathy Possibly due to uremia. Also has significant leukocytosis with underlying UTI. Completed antibiotics but developed fever again and restarted.  Mentation improving over last few days. Avoid narcotics and/or benzodiazepine.  Ammonia level was elevated.normal LFTs. HIV antibody negative. RPR reactive with positive T pallidum antibodies. Dr.  Cena Benton spoke with ID who recommended the patient unlikely has neurosyphilis and recommended to treat for latent syphilis with benzathine penicillin once a week for 3 weeks.  Wife reports he is 80% of baseline.    Leukocytosis Presented with WBC of almost 30 K. cultures were negative. Now improving.  Lobar pneumonia Patient febrile on 2/25 with chest x-ray showing bibasilar opacity concerning for pneumonia. place him on Rocephin and azithromycin. Check UA.  Bladder stones with sludge Continue irrigation of Foley. Has a large complex renal cyst. Needs workup for malignancy as outpatient.   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 AFP negative. Biopsy versus repeat CT scan in 3 months as outpatient.   Bright red blood per rectum On admission. No further episodes. stable.   Severe protein calorie malnutrition Continue supplement.  DVT prophylaxis: SCD  Diet: Renal     Code Status: Full code Family Communication: wife at bedside Disposition Plan: Currently inpatient.  skilled nursing facility sometime this week if renal function continues to improve..   Consultants:  Renal  Urology  Procedures:  Temporary HD catheter placement on 2/16  Head CT  CT abdomen and pelvis  Ultrasound kidney  Antibiotics:   IV rocephin and azithromycin 2/26--  Benzathine penicillin once a week for 3 weeks  HPI/Subjective: Seen and examined his morning. Was febrile to 100.77F. denies specific symptoms. More oriented.  Objective: Filed Vitals:   06/26/15 0500 06/26/15 1100  BP: 89/60 87/52  Pulse: 112 106  Temp: 98.5 F (36.9 C) 99 F (37.2 C)  Resp: 18 20    Intake/Output Summary (Last 24 hours) at 06/26/15 1431 Last data filed at 06/26/15 0600  Gross per 24 hour  Intake    120 ml  Output    500  ml  Net   -380 ml   Filed Weights   06/21/15 1123 06/23/15 0655 06/23/15 1100  Weight: 81.6 kg (179 lb 14.3 oz) 80.7 kg (177 lb 14.6 oz) 79.8 kg (175 lb 14.8  oz)    Exam:   General:  Not in distress,   HEENT:  moist mucosa,   Chest: Clear bilaterally   CVS: Normal S1 and S2, no murmurs rub or gallop  Cardiovascular: Normal S1 and S2,  GI: Soft, nondistended, nontender, bowel sounds present, Foley in place draining clear urine  Musculoskeletal: Warm, no edema,   CNS: alert and oriented x 2-3  Data Reviewed: Basic Metabolic Panel:  Recent Labs Lab 06/20/15 0520 06/21/15 0706 06/22/15 0613 06/23/15 0706 06/25/15 0932 06/26/15 0622  NA 136 136 136 136 136 139  K 3.9 4.2 4.9 4.7 4.2 4.4  CL 99* 98* 97* 98* 99* 104  CO2 GLUCOSE 195* 108* 87 110* 148* 114*  BUN 47* 63* 38* 57* 40* 46*  CREATININE 8.63* 10.65* 7.85* 10.12* 4.81* 4.61*  CALCIUM 7.6* 7.9* 7.7* 7.7* 8.0* 7.8*  PHOS 5.1* 6.0* 6.3*  --   --  4.9*   Liver Function Tests:  Recent Labs Lab 06/19/15 1530 06/20/15 0520 06/21/15 0706 06/22/15 0613 06/26/15 0622  AST 38  --   --   --   --   ALT 32  --   --   --   --   ALKPHOS 90  --   --   --   --   BILITOT 0.3  --   --   --   --   PROT 6.3*  --   --   --   --   ALBUMIN 1.5* 1.5* 1.5* 1.5* 1.5*   No results for input(s): LIPASE, AMYLASE in the last 168 hours.  Recent Labs Lab 06/19/15 1530  AMMONIA 45*   CBC:  Recent Labs Lab 06/22/15 0614 06/23/15 0706 06/24/15 0530 06/25/15 0602 06/26/15 0622  WBC 20.9* 18.2* 15.9* 13.8* 13.6*  NEUTROABS  --  15.7*  --   --   --   HGB 8.9* 9.1* 8.5* 8.7* 8.5*  HCT 26.7* 26.8* 25.8* 27.3* 26.5*  MCV 85.6 85.6 84.0 85.6 85.2  PLT 205 240 251 284 325   Cardiac Enzymes: No results for input(s): CKTOTAL, CKMB, CKMBINDEX, TROPONINI in the last 168 hours. BNP (last 3 results) No results for input(s): BNP in the last 8760 hours.  ProBNP (last 3 results) No results for input(s): PROBNP in the last 8760 hours.  CBG: No results for input(s): GLUCAP in the last 168 hours.  Recent Results (from the past 240 hour(s))  Culture, blood (Routine  X 2) w Reflex to ID Panel     Status: None   Collection Time: 06/19/15  3:06 PM  Result Value Ref Range Status   Specimen Description BLOOD RIGHT ANTECUBITAL  Final   Special Requests BOTTLES DRAWN AEROBIC ONLY 5CC  Final   Culture NO GROWTH 5 DAYS  Final   Report Status 06/24/2015 FINAL  Final  Culture, blood (Routine X 2) w Reflex to ID Panel     Status: None   Collection Time: 06/19/15  3:12 PM  Result Value Ref Range Status   Specimen Description BLOOD LEFT ANTECUBITAL  Final   Special Requests BOTTLES DRAWN AEROBIC ONLY 5CC  Final   Culture NO GROWTH 5 DAYS  Final   Report Status 06/24/2015 FINAL  Final  Studies: Dg Chest Port 1 View  06/26/2015  CLINICAL DATA:  Fever last night EXAM: PORTABLE CHEST 1 VIEW COMPARISON:  06/19/2015 FINDINGS: Consolidation in the left lower lobe concerning for pneumonia. Right basilar atelectasis or infiltrate. Heart is normal size. No effusions. No real change since prior study. IMPRESSION: Bibasilar opacities, left greater than right, concerning for pneumonia on the left. No real change. Electronically Signed   By: Charlett Nose M.D.   On: 06/26/2015 08:23    Scheduled Meds: . azithromycin  500 mg Intravenous Daily  . calcium acetate  667 mg Oral TID WC  . cefTRIAXone (ROCEPHIN)  IV  1 g Intravenous Q24H  . darbepoetin (ARANESP) injection - DIALYSIS  60 mcg Intravenous Q Tue-HD  . docusate sodium  100 mg Oral BID  . feeding supplement (NEPRO CARB STEADY)  237 mL Oral BID BM  . multivitamin  1 tablet Oral QHS  . penicillin G benzathine-penicillin G procaine  1.2 Million Units Intramuscular Weekly   Continuous Infusions: . sodium chloride 100 mL/hr at 06/26/15 1339       Time spent: 25 minutes    Eddie North  [facility] Pager [phone] If 7PM-7AM, please contact night-coverage at [email or web address], password [facility] 06/26/2015, 2:31 PM  LOS: 13 days

## 2015-06-27 LAB — URINE CULTURE: Culture: NO GROWTH

## 2015-06-27 NOTE — Progress Notes (Signed)
[facility] ROUNDING NOTE   Subjective:   Interval History: a little brighter today   Objective:  Vital signs in last 24 hours:  Temp:  [97.9 F (36.6 C)-99 F (37.2 C)] 97.9 F (36.6 C) (02/27 0814) Pulse Rate:  [101-106] 101 (02/27 0814) Resp:  [20] 20 (02/27 0814) BP: (87-128)/(52-54) 128/54 mmHg (02/27 0814) SpO2:  [94 %-95 %] 95 % (02/27 0814)  Weight change:  Filed Weights   06/21/15 1123 06/23/15 0655 06/23/15 1100  Weight: 81.6 kg (179 lb 14.3 oz) 80.7 kg (177 lb 14.6 oz) 79.8 kg (175 lb 14.8 oz)    Intake/Output: I/O last 3 completed shifts: In: 1875 [P.O.:240; I.V.:1635] Out: 1800 [Urine:1800]   Intake/Output this shift:     General: [name] is restless - Heart:rrrr, no rub, mur galop Lungs; CTA somewhat poor resp effort/ non labored breathing Abdomen: Soft/ improved right lower quadrant tenderness to deep palp/ ND Extremities: No Pedal edema    Basic Metabolic Panel:  Recent Labs Lab 06/21/15 0706 06/22/15 0613 06/23/15 0706 06/25/15 0932 06/26/15 0622  NA 136 136 136 136 139  K 4.2 4.9 4.7 4.2 4.4  CL 98* 97* 98* 99* 104  CO2 GLUCOSE 108* 87 110* 148* 114*  BUN 63* 38* 57* 40* 46*  CREATININE 10.65* 7.85* 10.12* 4.81* 4.61*  CALCIUM 7.9* 7.7* 7.7* 8.0* 7.8*  PHOS 6.0* 6.3*  --   --  4.9*    Liver Function Tests:  Recent Labs Lab 06/21/15 0706 06/22/15 0613 06/26/15 0622  ALBUMIN 1.5* 1.5* 1.5*   No results for input(s): LIPASE, AMYLASE in the last 168 hours. No results for input(s): AMMONIA in the last 168 hours.  CBC:  Recent Labs Lab 06/22/15 0614 06/23/15 0706 06/24/15 0530 06/25/15 0602 06/26/15 0622  WBC 20.9* 18.2* 15.9* 13.8* 13.6*  NEUTROABS  --  15.7*  --   --   --   HGB 8.9* 9.1* 8.5* 8.7* 8.5*  HCT 26.7* 26.8* 25.8* 27.3* 26.5*  MCV 85.6 85.6 84.0 85.6 85.2  PLT 205 240 251 284 325    Cardiac Enzymes: No results for input(s): CKTOTAL, CKMB, CKMBINDEX, TROPONINI in the last 168  hours.  BNP: Invalid input(s): POCBNP  CBG: No results for input(s): GLUCAP in the last 168 hours.  Microbiology: Results for orders placed or performed during the hospital encounter of 06/13/15  Culture, blood (x 2)     Status: None   Collection Time: 06/13/15 11:03 PM  Result Value Ref Range Status   Specimen Description BLOOD LEFT ARM  Final   Special Requests BOTTLES DRAWN AEROBIC AND ANAEROBIC 5 CC  Final   Culture   Final    NO GROWTH 5 DAYS Performed at [facility]    Report Status 06/19/2015 FINAL  Final  Culture, blood (x 2)     Status: None   Collection Time: 06/13/15 11:04 PM  Result Value Ref Range Status   Specimen Description BLOOD RIGHT HAND  Final   Special Requests BOTTLES DRAWN AEROBIC AND ANAEROBIC 5 CC  Final   Culture   Final    NO GROWTH 5 DAYS Performed at [facility]    Report Status 06/19/2015 FINAL  Final  Culture, Urine     Status: None   Collection Time: 06/14/15  7:41 AM  Result Value Ref Range Status   Specimen Description URINE, RANDOM  Final   Special Requests NONE  Final   Culture   Final  NO GROWTH 1 DAY Performed at [facility]    Report Status 06/15/2015 FINAL  Final  Culture, blood (Routine X 2) w Reflex to ID Panel     Status: None   Collection Time: 06/19/15  3:06 PM  Result Value Ref Range Status   Specimen Description BLOOD RIGHT ANTECUBITAL  Final   Special Requests BOTTLES DRAWN AEROBIC ONLY 5CC  Final   Culture NO GROWTH 5 DAYS  Final   Report Status 06/24/2015 FINAL  Final  Culture, blood (Routine X 2) w Reflex to ID Panel     Status: None   Collection Time: 06/19/15  3:12 PM  Result Value Ref Range Status   Specimen Description BLOOD LEFT ANTECUBITAL  Final   Special Requests BOTTLES DRAWN AEROBIC ONLY 5CC  Final   Culture NO GROWTH 5 DAYS  Final   Report Status 06/24/2015 FINAL  Final    Coagulation Studies:  Recent Labs  06/24/15 1455  LABPROT 17.6*  INR 1.44     Urinalysis:  Recent Labs  06/26/15 1429  COLORURINE YELLOW  LABSPEC 1.014  PHURINE 5.5  GLUCOSEU NEGATIVE  HGBUR LARGE*  BILIRUBINUR NEGATIVE  KETONESUR NEGATIVE  PROTEINUR 100*  NITRITE NEGATIVE  LEUKOCYTESUR LARGE*      Imaging: Dg Chest Port 1 View  06/26/2015  CLINICAL DATA:  Fever last night EXAM: PORTABLE CHEST 1 VIEW COMPARISON:  06/19/2015 FINDINGS: Consolidation in the left lower lobe concerning for pneumonia. Right basilar atelectasis or infiltrate. Heart is normal size. No effusions. No real change since prior study. IMPRESSION: Bibasilar opacities, left greater than right, concerning for pneumonia on the left. No real change. Electronically Signed   By: Charlett Nose M.D.   On: 06/26/2015 08:23     Medications:     . azithromycin  500 mg Intravenous Daily  . calcium acetate  667 mg Oral TID WC  . cefTRIAXone (ROCEPHIN)  IV  1 g Intravenous Q24H  . darbepoetin (ARANESP) injection - DIALYSIS  60 mcg Intravenous Q Tue-HD  . docusate sodium  100 mg Oral BID  . feeding supplement (NEPRO CARB STEADY)  237 mL Oral BID BM  . multivitamin  1 tablet Oral QHS  . penicillin G benzathine-penicillin G procaine  1.2 Million Units Intramuscular Weekly   acetaminophen **OR** acetaminophen, ondansetron **OR** ondansetron (ZOFRAN) IV, polyethylene glycol, sodium chloride flush  Assessment/ Plan:  1. Renal failure with bilateral hydronephrosis on initial [place] (and was also taking NSAIDS) Unknown chronicity as no previous information on kidney function. Foley placed and initially 2L urine in the bladder, but no UOP since - then found out foley was in wrong place- with manipulation much UOP again ! TemporaryHD catheter placed 06/16/15 in IR Dialysis on 2/16, 2/17 and 2/18, 2/21 and 2/23- with pre HD creatinine 10- seemed to be at essentially ESRD until this latest revelation.  will check labs and hopefully can hold off on HD since has pulled out his access  2. Encephalopathy  - CT Head = Mild Sm Vess Ischem microangiopathy/ no acute intracarn. Pathology/ ?? 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 Leukocytosis - WBC as high as 30,000 treated with azithromycin and rocephin 4.Mild anemia - Hgb decreasing stedily- is 9.1 this am Start Aranesp 60 q week ly hd 5.Bladder sludge/bladder stones - irrigating foley QShift (to make sure lack of urine output isn't related to bladder sludge  occluding foley)- will need eradication at some point as well  6 Large complex renal cyst - cannot exclude malignancy. Not the cause of his RF but will need eval at some point.. Urology aware 8. MBD - check iPTH - P 5.8>7.1> 6.0>6.3 this am  Started phoslo - last Ca 7.6 Corec ca 9.0 9. Nutrition - Alb 2.0 >1.8>1.5 - Ensure / renal diet - multivit / sl better appetite this am 10. Dispo- does not seem to be overall improving, especially MS wise - will see if his BOO is fixed will help anything. We have no idea how long present and if this will improve or not. We shall follow and hope for recovery    LOS: 14 Jeff Parks W @TODAY @9 :26 AM

## 2015-06-27 NOTE — Care Management Important Message (Signed)
Important Message  Patient Details  Name: Jeff Parks MRN: [id number] Date of Birth: 12-19-1929   Medicare Important Message Given:  Yes    Johniya Durfee P Ediel Unangst 06/27/2015, 4:51 PM

## 2015-06-27 NOTE — Progress Notes (Signed)
Patient is confused and refusing care and lab this morning

## 2015-06-27 NOTE — Progress Notes (Signed)
[facility] PROGRESS NOTE  Jeff Parks ZOX:[id number] DOB: [birth year] DOA: 06/13/2015 PCP: No primary care provider on file.  brief narrative 82 year old male with no known past medical history, has not seen a doctor for several years presented with progressive weakness, altered mental status, difficulty urination and poor urine output. He was found to have acute kidney injury with uremic acidosis, bilateral hydronephrosis with bladder outlet obstruction secondary to enlarged prostate. Patient also reported using NSAIDs. A Foley catheter was placed and started on IV fluids. On admission he was also septic with fever, leukocytosis and tachycardia secondary to UTI. Initially he drained 2 L of urine after Foley placed but then no urine output since. Patient was evaluated by renal and is to catheter placed on 06/16/2015 to initiate dialysis. So far he has had for HD treatments. Repeat CT and ultrasound of the kidneys still show persistent dilatation of the collecting system.  Assessment/Plan: Acute kidney injury Due to bladder outlet obstruction with hydronephrosis. -Repeat abdominal CT done on 2/19 which showed persistent hydronephrosis but failed to report that his Foley catheter was outside the prostate. Foley was repositioned and has good urine output since. Seen by urology again and will reevaluate this week and decide on definitive treatment for his bladder outlet obstruction. -monitoring off HD. Creatinine slowly improving. ( patient pulled out his dialysis catheter on 2/24)  Acute encephalopathy Possibly due to uremia. Also has significant leukocytosis with underlying UTI. Completed antibiotics but developed fever again and restarted.  Mentation improving over last few days. Avoid narcotics and/or benzodiazepine.  Ammonia level was elevated.normal LFTs. HIV antibody negative. RPR reactive with positive T pallidum antibodies. Dr. Cena Benton spoke with ID who recommended the patient unlikely has  neurosyphilis and recommended to treat for latent syphilis with benzathine penicillin once a week for 3 weeks.  Wife reports he is 80-90 % of baseline.    Leukocytosis Presented with WBC of almost 30 K. cultures were negative. Now improving.  Lobar pneumonia Patient febrile on 2/25 with chest x-ray showing bibasilar opacity concerning for pneumonia. placed him on Rocephin and azithromycin. UA suggests UTI but negative culture.  Bladder stones with sludge Continue irrigation of Foley. Has a large complex renal cyst. Needs workup for malignancy as outpatient.   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 AFP negative. Biopsy versus repeat CT scan in 3 months as outpatient.   Bright red blood per rectum On admission. No further episodes. stable.   Severe protein calorie malnutrition Continue supplement.  DVT prophylaxis: SCD  Diet: Renal     Code Status: Full code Family Communication: wife at bedside Disposition Plan: Currently inpatient.  skilled nursing facility sometime this week if renal function continues to improve..   Consultants:  Renal  Urology  Procedures:  Temporary HD catheter placement on 2/16  Head CT  CT abdomen and pelvis  Ultrasound kidney  Antibiotics:   IV rocephin and azithromycin 2/26--  Benzathine penicillin once a week for 3 weeks  HPI/Subjective: Seen and examined . No overnight issues. Remains afebrile  Objective: Filed Vitals:   06/26/15 1100 06/27/15 0814  BP: 87/52 128/54  Pulse: 106 101  Temp:  97.9 F (36.6 C)  Resp: 20 20    Intake/Output Summary (Last 24 hours) at 06/27/15 1357 Last data filed at 06/27/15 0900  Gross per 24 hour  Intake   1755 ml  Output   1300 ml  Net    455 ml   Filed Weights   06/21/15 1123  06/23/15 0655 06/23/15 1100  Weight: 81.6 kg (179 lb 14.3 oz) 80.7 kg (177 lb 14.6 oz) 79.8 kg (175 lb 14.8 oz)    Exam:   General:  Not in distress,   HEENT:  moist mucosa,    Chest: Clear bilaterally   CVS: Normal S1 and S2, no murmurs  GI: Soft, nondistended, nontender, bowel sounds present, Foley+  Musculoskeletal: Warm, no edema,   CNS: alert and oriented x 2-3  Data Reviewed: Basic Metabolic Panel:  Recent Labs Lab 06/21/15 0706 06/22/15 0613 06/23/15 0706 06/25/15 0932 06/26/15 0622  NA 136 136 136 136 139  K 4.2 4.9 4.7 4.2 4.4  CL 98* 97* 98* 99* 104  CO2 GLUCOSE 108* 87 110* 148* 114*  BUN 63* 38* 57* 40* 46*  CREATININE 10.65* 7.85* 10.12* 4.81* 4.61*  CALCIUM 7.9* 7.7* 7.7* 8.0* 7.8*  PHOS 6.0* 6.3*  --   --  4.9*   Liver Function Tests:  Recent Labs Lab 06/21/15 0706 06/22/15 0613 06/26/15 0622  ALBUMIN 1.5* 1.5* 1.5*   No results for input(s): LIPASE, AMYLASE in the last 168 hours. No results for input(s): AMMONIA in the last 168 hours. CBC:  Recent Labs Lab 06/22/15 0614 06/23/15 0706 06/24/15 0530 06/25/15 0602 06/26/15 0622  WBC 20.9* 18.2* 15.9* 13.8* 13.6*  NEUTROABS  --  15.7*  --   --   --   HGB 8.9* 9.1* 8.5* 8.7* 8.5*  HCT 26.7* 26.8* 25.8* 27.3* 26.5*  MCV 85.6 85.6 84.0 85.6 85.2  PLT 205 240 251 284 325   Cardiac Enzymes: No results for input(s): CKTOTAL, CKMB, CKMBINDEX, TROPONINI in the last 168 hours. BNP (last 3 results) No results for input(s): BNP in the last 8760 hours.  ProBNP (last 3 results) No results for input(s): PROBNP in the last 8760 hours.  CBG: No results for input(s): GLUCAP in the last 168 hours.  Recent Results (from the past 240 hour(s))  Culture, blood (Routine X 2) w Reflex to ID Panel     Status: None   Collection Time: 06/19/15  3:06 PM  Result Value Ref Range Status   Specimen Description BLOOD RIGHT ANTECUBITAL  Final   Special Requests BOTTLES DRAWN AEROBIC ONLY 5CC  Final   Culture NO GROWTH 5 DAYS  Final   Report Status 06/24/2015 FINAL  Final  Culture, blood (Routine X 2) w Reflex to ID Panel     Status: None   Collection Time: 06/19/15   3:12 PM  Result Value Ref Range Status   Specimen Description BLOOD LEFT ANTECUBITAL  Final   Special Requests BOTTLES DRAWN AEROBIC ONLY 5CC  Final   Culture NO GROWTH 5 DAYS  Final   Report Status 06/24/2015 FINAL  Final  Culture, Urine     Status: None   Collection Time: 06/26/15  2:29 PM  Result Value Ref Range Status   Specimen Description URINE, CATHETERIZED  Final   Special Requests rocephin  Final   Culture NO GROWTH 1 DAY  Final   Report Status 06/27/2015 FINAL  Final     Studies: Dg Chest Port 1 View  06/26/2015  CLINICAL DATA:  Fever last night EXAM: PORTABLE CHEST 1 VIEW COMPARISON:  06/19/2015 FINDINGS: Consolidation in the left lower lobe concerning for pneumonia. Right basilar atelectasis or infiltrate. Heart is normal size. No effusions. No real change since prior study. IMPRESSION: Bibasilar opacities, left greater than right, concerning for pneumonia on the left. No real  change. Electronically Signed   By: Charlett Nose M.D.   On: 06/26/2015 08:23    Scheduled Meds: . azithromycin  500 mg Intravenous Daily  . calcium acetate  667 mg Oral TID WC  . cefTRIAXone (ROCEPHIN)  IV  1 g Intravenous Q24H  . darbepoetin (ARANESP) injection - DIALYSIS  60 mcg Intravenous Q Tue-HD  . docusate sodium  100 mg Oral BID  . feeding supplement (NEPRO CARB STEADY)  237 mL Oral BID BM  . multivitamin  1 tablet Oral QHS  . penicillin G benzathine-penicillin G procaine  1.2 Million Units Intramuscular Weekly   Continuous Infusions:       Time spent: 25 minutes    Eddie North  [facility] Pager [phone] If 7PM-7AM, please contact night-coverage at [email or web address], password [facility] 06/27/2015, 1:57 PM  LOS: 14 days

## 2015-06-27 NOTE — Progress Notes (Signed)
Subjective: Patient reports making more urine. Initially had 2L acute retention. Foley obstructed last week, and noted to have decreased output and hydro, but catheter manipulation Friday allowed drainage and pt has had persistent urinary drainage all weekend. Case discussed with Drl [place] Friday pm, and pt may have TURP if bladder is responsive. He has bladder stones, and will probably need cystolitholopaxy, JJ stents-ig hydro persists, TURP, ane s-p tube.  Objective: Vital signs in last 24 hours: Temp:  [97.9 F (36.6 C)] 97.9 F (36.6 C) (02/27 0814) Pulse Rate:  [101] 101 (02/27 0814) Resp:  [20] 20 (02/27 0814) BP: (128)/(54) 128/54 mmHg (02/27 0814) SpO2:  [95 %] 95 % (02/27 0814)A  Intake/Output from previous day: 02/26 0701 - 02/27 0700 In: 1755 [P.O.:120; I.V.:1635] Out: 1300 [Urine:1300] Intake/Output this shift:    Past Medical History  Diagnosis Date  . Constipation     Physical Exam:  Lungs - Normal respiratory effort, chest expands symmetrically.  Abdomen - Soft, non-tender & non-distended.  Lab Results:  Recent Labs  06/25/15 0602 06/26/15 0622  WBC 13.8* 13.6*  HGB 8.7* 8.5*  HCT 27.3* 26.5*   BMET  Recent Labs  06/25/15 0932 06/26/15 0622  NA 136 139  K 4.2 4.4  CL 99* 104  CO2 23 23  GLUCOSE 148* 114*  BUN 40* 46*  CREATININE 4.81* 4.61*  CALCIUM 8.0* 7.8*   No results for input(s): LABURIN in the last 72 hours. Results for orders placed or performed during the hospital encounter of 06/13/15  Culture, blood (x 2)     Status: None   Collection Time: 06/13/15 11:03 PM  Result Value Ref Range Status   Specimen Description BLOOD LEFT ARM  Final   Special Requests BOTTLES DRAWN AEROBIC AND ANAEROBIC 5 CC  Final   Culture   Final    NO GROWTH 5 DAYS Performed at [facility]    Report Status 06/19/2015 FINAL  Final  Culture, blood (x 2)     Status: None   Collection Time: 06/13/15 11:04 PM  Result Value Ref Range Status   Specimen Description BLOOD RIGHT HAND  Final   Special Requests BOTTLES DRAWN AEROBIC AND ANAEROBIC 5 CC  Final   Culture   Final    NO GROWTH 5 DAYS Performed at [facility]    Report Status 06/19/2015 FINAL  Final  Culture, Urine     Status: None   Collection Time: 06/14/15  7:41 AM  Result Value Ref Range Status   Specimen Description URINE, RANDOM  Final   Special Requests NONE  Final   Culture   Final    NO GROWTH 1 DAY Performed at [facility]    Report Status 06/15/2015 FINAL  Final  Culture, blood (Routine X 2) w Reflex to ID Panel     Status: None   Collection Time: 06/19/15  3:06 PM  Result Value Ref Range Status   Specimen Description BLOOD RIGHT ANTECUBITAL  Final   Special Requests BOTTLES DRAWN AEROBIC ONLY 5CC  Final   Culture NO GROWTH 5 DAYS  Final   Report Status 06/24/2015 FINAL  Final  Culture, blood (Routine X 2) w Reflex to ID Panel     Status: None   Collection Time: 06/19/15  3:12 PM  Result Value Ref Range Status   Specimen Description BLOOD LEFT ANTECUBITAL  Final   Special Requests BOTTLES DRAWN AEROBIC ONLY 5CC  Final   Culture NO GROWTH 5 DAYS  Final   Report Status 06/24/2015 FINAL  Final  Culture, Urine     Status: None   Collection Time: 06/26/15  2:29 PM  Result Value Ref Range Status   Specimen Description URINE, CATHETERIZED  Final   Special Requests rocephin  Final   Culture NO GROWTH 1 DAY  Final   Report Status 06/27/2015 FINAL  Final    Studies/Results:  Ultrasound from 2/23 CLINICAL DATA: 82 year old male with acute kidney injury and urinary retention. Followup hydronephrosis.  EXAM: RENAL / URINARY TRACT ULTRASOUND COMPLETE  COMPARISON: 06/18/2015 ultrasound.  FINDINGS: Right Kidney:  Length: 18.5 cm. Increased renal echogenicity noted. A 12 cm simple appearing right renal cyst is again identified. Mild -moderate right hydronephrosis does not appear significantly changed.  Left Kidney:  Length:  13.3 cm. A 13 cm complex left renal cyst is again identified. Echogenicity is mildly increased. Mild -moderate left hydronephrosis does not appear significantly changed.  Bladder:  Debris and calculi within the bladder again noted.  IMPRESSION: No significant change in mild -moderate bilateral hydronephrosis.  Unchanged 13 cm complex left renal cyst.  Debris and calculi within the bladder again noted.   Electronically Signed  By: Harmon Pier M.D.  On: 06/23/2015 15:45   Assessment: U/s from 2/23 shows continued hydronephrosis. Cathetert now draining much better. Will repeat renal u/s.  Plan:  Repeat renal u/s.   Kannon Granderson I Sigifredo Pignato 06/27/2015, 8:16 PM

## 2015-06-27 NOTE — Progress Notes (Signed)
OT Cancellation Note  Patient Details Name: Alassane Kalafut MRN: [id number] DOB: 05/16/1929   Cancelled Treatment:    Reason Eval/Treat Not Completed: Other (comment) Pt declined. Multiple trys. Daughter present. Will attempt again later date.  [facility] Sunshyne Horvath, OTR/L  [phone] 06/27/2015 06/27/2015, 3:46 PM

## 2015-06-27 NOTE — Progress Notes (Signed)
Patient refused am labs.  Rescheduled to 0900 to wait on wife to come visit patient.  She is now in the room and I explained situation to her.  She will try to convince him to let us draw labs.  Will continue to monitor patient.  [employer] RN-BC, WTA.

## 2015-06-27 NOTE — Progress Notes (Signed)
PT Cancellation Note  Patient Details Name: Jeff Parks MRN: [id number] DOB: April 19, 1930   Cancelled Treatment:    Reason Eval/Treat Not Completed: Patient declined, no reason specified.  Pt refused, did not tell PT why. Wife is not currently in room. May go better if wife is present.  PT will check back later today or tomorrow as time allows.  Thanks,   Rollene Rotunda. Chales Pelissier, PT, DPT [phone]   06/27/2015, 2:19 PM

## 2015-06-28 ENCOUNTER — Inpatient Hospital Stay (HOSPITAL_COMMUNITY): Payer: Medicare Other

## 2015-06-28 DIAGNOSIS — J189 Pneumonia, unspecified organism: Secondary | ICD-10-CM | POA: Diagnosis not present

## 2015-06-28 DIAGNOSIS — D649 Anemia, unspecified: Secondary | ICD-10-CM

## 2015-06-28 DIAGNOSIS — N320 Bladder-neck obstruction: Secondary | ICD-10-CM | POA: Diagnosis present

## 2015-06-28 DIAGNOSIS — N3000 Acute cystitis without hematuria: Secondary | ICD-10-CM

## 2015-06-28 DIAGNOSIS — Q6100 Congenital renal cyst, unspecified: Secondary | ICD-10-CM

## 2015-06-28 DIAGNOSIS — G9340 Encephalopathy, unspecified: Secondary | ICD-10-CM | POA: Diagnosis present

## 2015-06-28 DIAGNOSIS — E43 Unspecified severe protein-calorie malnutrition: Secondary | ICD-10-CM

## 2015-06-28 DIAGNOSIS — Y95 Nosocomial condition: Secondary | ICD-10-CM

## 2015-06-28 DIAGNOSIS — E440 Moderate protein-calorie malnutrition: Secondary | ICD-10-CM | POA: Diagnosis present

## 2015-06-28 DIAGNOSIS — N1330 Unspecified hydronephrosis: Secondary | ICD-10-CM | POA: Diagnosis present

## 2015-06-28 LAB — BASIC METABOLIC PANEL
BUN: 36 mg/dL — AB (ref 4–21)
CREATININE: 2.8 mg/dL — AB (ref 0.6–1.3)
GLUCOSE: 183 mg/dL
Sodium: 140 mmol/L (ref 137–147)

## 2015-06-28 LAB — RENAL FUNCTION PANEL
ALBUMIN: 1.4 g/dL — AB (ref 3.5–5.0)
ANION GAP: 10 (ref 5–15)
BUN: 36 mg/dL — ABNORMAL HIGH (ref 6–20)
CHLORIDE: 107 mmol/L (ref 101–111)
CO2: 23 mmol/L (ref 22–32)
Calcium: 7.9 mg/dL — ABNORMAL LOW (ref 8.9–10.3)
Creatinine, Ser: 2.8 mg/dL — ABNORMAL HIGH (ref 0.61–1.24)
GFR, EST AFRICAN AMERICAN: 23 mL/min — AB (ref >60)
GFR, EST NON AFRICAN AMERICAN: 19 mL/min — AB (ref >60)
Glucose, Bld: 183 mg/dL — ABNORMAL HIGH (ref 65–99)
PHOSPHORUS: 3.9 mg/dL (ref 2.5–4.6)
POTASSIUM: 4 mmol/L (ref 3.5–5.1)
Sodium: 140 mmol/L (ref 135–145)

## 2015-06-28 MED ORDER — NEPRO/CARBSTEADY PO LIQD: 237.0000 mL | Freq: Two times a day (BID) | ORAL | Status: DC

## 2015-06-28 MED ORDER — LEVOFLOXACIN 500 MG PO TABS: 500.0000 mg | ORAL_TABLET | ORAL | Status: AC

## 2015-06-28 MED ORDER — PENICILLIN G BENZATHINE & PROC 900000-300000 UNIT/2ML IM SUSP: 1.2000 10*6.[IU] | INTRAMUSCULAR | Status: DC

## 2015-06-28 NOTE — Discharge Summary (Addendum)
Physician Discharge Summary  Jeff Parks NWG:[id number] DOB: 12-31-1929 DOA: 06/13/2015  PCP: No primary care provider on file.  Admit date: 06/13/2015 Discharge date: 06/28/2015  Time spent: 35 minutes  Recommendations for Outpatient Follow-up:  1. Discharge to skilled nursing facility. Continue Foley catheter upon discharge. Monitor renal function every 2-3 days. 2. Follow-up with urology Dr. Patsi Sears ([facility]) in 1 week 3. Follow-up with nephrology Dr. will follow in 2 weeks 4. Complete antibiotics for pneumonia and UTI on 3/3 (Levaquin). Complete benzocaine penicillin on 3/9 (once a week for 2 doses , next dose on 3/2)   Discharge Diagnoses:  Principal Problem:   AKI (acute kidney injury) (HCC)   Active Problems:   Sepsis secondary to UTI (HCC)   Urinary retention   Anorexia   Leukocytosis   Elevated blood pressure   Anemia   Bright red blood per rectum   UTI (urinary tract infection)   Hyperthyroidism   Hydronephrosis, bilateral   Bladder outlet obstruction   Acute encephalopathy   HAP (hospital-acquired pneumonia)   Protein-calorie malnutrition, severe   Discharge Condition: Fair  Diet recommendation: Renal with supplements  CODE STATUS: Full code  [facility] Weights   06/21/15 1123 06/23/15 0655 06/23/15 1100  Weight: 81.6 kg (179 lb 14.3 oz) 80.7 kg (177 lb 14.6 oz) 79.8 kg (175 lb 14.8 oz)    History of present illness:  82 year old male with no known past medical history, has not seen a doctor for several years presented with progressive weakness, altered mental status, difficulty urination and poor urine output. He was found to have acute kidney injury with uremic acidosis, bilateral hydronephrosis with bladder outlet obstruction secondary to enlarged prostate. Patient also reported using NSAIDs. A Foley catheter was placed and started on IV fluids. On admission he was also septic with fever, leukocytosis and tachycardia secondary to UTI. Initially he  drained 2 L of urine after Foley placed but then no urine output since. Patient was evaluated by renal and is to catheter placed on 06/16/2015 to initiate dialysis. So far he has had for HD treatments. Repeat CT and ultrasound of the kidneys still show persistent dilatation of the collecting system.  Hospital Course:  Acute kidney injury Due to bladder outlet obstruction with hydronephrosis. -Repeat abdominal CT done on 2/19 which showed persistent hydronephrosis but failed to report that his Foley catheter was outside the prostate. Foley was repositioned and has good urine output since. Renal function has been progressively improved. -Renal ultrasound repeated today shows persistent mild bilateral hydronephrosis and slight increase in size of complex left renal cyst. Discussed finding with Dr. Patsi Sears will recommend this is expected. -Patient pulled out his dialysis catheter on 2/24. Has not required hemodialysis since 2/22.  Acute encephalopathy Possibly due to uremia and UTI. Also had significant leukocytosis with underlying UTI. Completed antibiotics with Rocephin but developed fever again on 2/24 and restarted Rocephin and azithromycin. Chest x-ray suggestive of pneumonia. Status progressively improving after renal function has been better. Ammonia level was elevated. normal LFTs. HIV antibody negative. RPR reactive with positive T pallidum antibodies. Dr. Cena Benton spoke with ID who recommended the patient unlikely has neurosyphilis and recommended to treat for latent syphilis with benzathine penicillin once a week for 3 weeks. Wife reports mental status is about -90 % of baseline.    Leukocytosis Presented with WBC of almost 30 K. cultures were negative. Now improving.  Lobar pneumonia Patient febrile on 2/25 with chest x-ray showing bibasilar opacity concerning for pneumonia. placed him on  Rocephin and azithromycin. UA suggests UTI but negative culture. Will discharge him on oral Levaquin  to complete 7 days of treatment.  Bladder stones with sludge and enlarged prostate Continue irrigation of Foley. Has a large complex renal cyst. Needs workup for malignancy as outpatient. Dr. Patsi Sears will schedule outpatient procedure for? TURP and evaluate bladder stones/sludge.   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 AFP negative. Recommend liver Biopsy versus repeat CT scan in 3 months as outpatient.   Bright red blood per rectum On admission. No further episodes. stable. Monitor H&H as outpatient   Severe protein calorie malnutrition Continue supplement.  Analyzed weakness  On seen by physical therapy and recommend skilled nursing facility.     Family Communication: wife at bedside Disposition Plan: Skilled nursing facility   Consultants:  Renal  Urology  Procedures:  Temporary HD catheter placement on 2/16  Head CT  CT abdomen and pelvis  Ultrasound kidney  Antibiotics:   IV rocephin and azithromycin 2/26--2/28  Benzathine penicillin once a week for 3 weeks until 3/9    Discharge Exam: Filed Vitals:   06/28/15 0618 06/28/15 0805  BP: 124/59 99/55  Pulse: 107 109  Temp: 98.4 F (36.9 C) 98.3 F (36.8 C)  Resp: 19 18     General: Elderly male not in distress  HEENT: Pallor present, moist mucosa, supple neck  Chest: Clear bilaterally  CVS: Normal S1 and S2, no murmurs  GI: Soft, nondistended, nontender, bowel sounds present, Foley+  Musculoskeletal: Warm, no edema,   CNS: alert and oriented x 2-3  Discharge Instructions    Current Discharge Medication List    START taking these medications   Details  levofloxacin (LEVAQUIN) 500 MG tablet Take 1 tablet (500 mg total) by mouth every other day. Qty: 2 tablet, Refills: 0    Nutritional Supplements (FEEDING SUPPLEMENT, NEPRO CARB STEADY,) LIQD Take 237 mLs by mouth 2 (two) times daily between meals. Qty: 60 Can, Refills: 0    penicillin G  benzathine-penicillin G procaine (BICILLIN-CR) 900000-300000 UNIT/2ML SUSP Inject 2 mLs (1.2 Million Units total) into the muscle once a week. Qty: 2 mL, Refills: 0      CONTINUE these medications which have NOT CHANGED   Details  Multiple Vitamins-Minerals (MULTIVITAMIN ADULT PO) Take 1 tablet by mouth daily.      STOP taking these medications     naproxen sodium (ANAPROX) 220 MG tablet        No Known Allergies Follow-up Information    Follow up with SIGMUND I TANNENBAUM, MD. Schedule an appointment as soon as possible for a visit in 1 week.   Specialty:  Urology   Contact information:   [address] [place] [address] [phone]       Follow up with GOLDSBOROUGH,KELLIE A, MD. Schedule an appointment as soon as possible for a visit in 2 weeks.   Specialty:  Nephrology   Contact information:   [address] [place] [address] [phone]        The results of significant diagnostics from this hospitalization (including imaging, microbiology, ancillary and laboratory) are listed below for reference.    Significant Diagnostic Studies: Ct Abdomen Pelvis Wo Contrast  06/19/2015  CLINICAL DATA:  Bilateral hydronephrosis on recent ultrasounds. Renal insufficiency. Abdominal pain. Bloody stools. EXAM: CT ABDOMEN AND PELVIS WITHOUT CONTRAST TECHNIQUE: Multidetector CT imaging of the abdomen and pelvis was performed following the standard protocol without IV contrast. COMPARISON:  Renal ultrasound obtained yesterday FINDINGS:  The examination is limited by streak artifact produced by the patient's arms. Lower chest: Small to moderate-sized left pleural effusion and small right pleural effusion. Bilateral lower lobe atelectasis, greater on the left. Hepatobiliary: Large number of small gallstones in the gallbladder. The largest measures 5 mm. No gallbladder wall thickening or pericholecystic fluid. Four left lobe liver cysts. There is also a 3.8 x 2.7 cm oval, oval area of low  density in the anterior segment of the right lobe of the liver measuring 26 Hounsfield units in density on image 10. Pancreas: No mass or inflammatory process identified on this un-enhanced exam. Spleen: Within normal limits in size. Adrenals/Urinary Tract: Normal appearing right adrenal gland. The left adrenal gland is poorly visualized due to adjacent soft tissue stranding. No gross left adrenal abnormality seen. Again demonstrated is a large upper pole left renal cyst with diffuse, irregular wall thickening and a thin internal septation. This measures 13.2 cm in maximum diameter on image number 25 of series 2. There is surrounding soft tissue stranding. A smaller lower pole left renal cyst is noted. A large lower pole right renal cyst is demonstrated with a more simple appearance, measuring 12.1 cm in maximum diameter. There is a small linear calcification along the wall of this cyst laterally. There are 2 additional smaller right renal cysts. Mild to moderate dilatation of both renal collecting systems. Moderate dilatation of the left ureter to the ureterovesical junction. Mild to moderate dilatation of the right ureter to the level of the ureterovesical junction. No obstructing stones seen at those locations. There are multiple bladder calculi measuring up to 1.2 cm in maximum diameter each. There is also air in the urinary bladder and mild diffuse bladder wall thickening. A Foley catheter is in place. Stomach/Bowel: Multiple colonic diverticula without evidence of diverticulitis. No evidence of appendicitis. No gastric or small bowel abnormalities. Vascular/Lymphatic: Atheromatous arterial calcifications. No enlarged lymph nodes. Reproductive: Moderately enlarged prostate gland. Other: Small supraumbilical hernia containing fat. Musculoskeletal: Lumbar and lower thoracic spine degenerative changes. Mild right hip degenerative changes. Minimal left hip degenerative changes. IMPRESSION: 1. Limited examination  due to the lack of intravenous and oral contrast and due to streak artifacts produced by the patient's arms. 2. 13.2 cm complex left renal cyst with surrounding soft tissue stranding. This could represent an infected cyst. A cystic neoplasm is also possible. 3. Followup 0.1 cm minimally complex right renal cyst. 4. Mild to moderate bilateral hydronephrosis and bilateral hydroureter wrist new levels of the ureterovesical junctions with no obstructing calculus or mass seen. 5. Multiple bladder calculi. 6. Moderately enlarged prostate gland. 7. Mild diffuse bladder wall thickening, compatible with chronic bladder outlet obstruction. 8. Bilateral pleural effusions and bilateral lower lobe atelectasis. 9. Cholelithiasis. 10. 3.8 cm right lobe liver mass. This may represent a complicated cyst or solid mass. 11. Small supraumbilical hernia containing fat. Electronically Signed   By: Beckie Salts M.D.   On: 06/19/2015 13:20   Dg Chest 2 View  06/19/2015  CLINICAL DATA:  Leukocytosis.  Abdominal pain. EXAM: CHEST  2 VIEW COMPARISON:  06/13/2015. FINDINGS: Poor inspiration. No gross change in a normal sized heart. Mildly increased left basilar atelectasis. Interval right basilar atelectasis. Thoracic spine degenerative changes. Interval right jugular catheter with its tip in the inferior right atrium. No pneumothorax. IMPRESSION: 1. Right jugular catheter tip in the inferior right atrium. This could be retracted 6.5 cm to place in the superior vena cava. 2. Mildly increased left basilar atelectasis with interval mild  to moderate right basilar atelectasis. Electronically Signed   By: Beckie Salts M.D.   On: 06/19/2015 13:28   Dg Chest 2 View  06/13/2015  CLINICAL DATA:  Shortness of breath for 5 days, constipation for 5 days, weakness EXAM: CHEST  2 VIEW COMPARISON:  None FINDINGS: Normal heart size and pulmonary vascularity. Tortuous thoracic aorta. Bronchitic changes with LEFT basilar atelectasis. Remaining lungs  clear. No definite pleural effusion or pneumothorax. IMPRESSION: Bronchitic changes with LEFT basilar atelectasis. Electronically Signed   By: Ulyses Southward M.D.   On: 06/13/2015 17:31   Ct Head Wo Contrast  06/20/2015  CLINICAL DATA:  Acute onset of somnolence. Altered mental status. Initial encounter. EXAM: CT HEAD WITHOUT CONTRAST TECHNIQUE: Contiguous axial images were obtained from the base of the skull through the vertex without intravenous contrast. COMPARISON:  None. FINDINGS: There is no evidence of acute infarction, mass lesion, or intra- or extra-axial hemorrhage on CT. Mild periventricular white matter change likely reflects small vessel ischemic microangiopathy. The posterior fossa, including the cerebellum, brainstem and fourth ventricle, is within normal limits. The third and lateral ventricles, and basal ganglia are unremarkable in appearance. The cerebral hemispheres are symmetric in appearance, with normal gray-white differentiation. No mass effect or midline shift is seen. There is no evidence of fracture; visualized osseous structures are unremarkable in appearance. The visualized portions of the orbits are within normal limits. The paranasal sinuses and mastoid air cells are well-aerated. No significant soft tissue abnormalities are seen. IMPRESSION: 1. No acute intracranial pathology seen on CT. 2. Mild small vessel ischemic microangiopathy. Electronically Signed   By: Roanna Raider M.D.   On: 06/20/2015 02:12   US Renal  06/28/2015  CLINICAL DATA:  Followup hydronephrosis. EXAM: RENAL / URINARY TRACT ULTRASOUND COMPLETE COMPARISON:  06/23/2015 and CT 06/19/2015 FINDINGS: Right Kidney: Length: 13.2 cm. Stable mild hydronephrosis. Stable cysts with the largest measuring 11.8 cm. Left Kidney: Length: 4.9 cm. Stable mild hydronephrosis. Again noted is a large complex cyst with dependent debris as this measures slightly larger with greatest diameter 14.9 cm (previously 12.8 cm). Bladder: Foley  catheter is present within a decompressed bladder. Several echogenic foci unchanged likely known bladder stones versus air. IMPRESSION: Normal size kidneys with persistent mild bilateral hydronephrosis. Slight increase in size of known complex left renal cyst measuring 14.9 cm (previously 12.8 cm). Stable large right renal cyst measuring 11.8 cm. Foley catheter within a decompressed bladder. Echogenic foci within the bladder compatible with known stones versus air. Electronically Signed   By: Elberta Fortis M.D.   On: 06/28/2015 12:05   US Renal  06/23/2015  CLINICAL DATA:  82 year old male with acute kidney injury and urinary retention. Followup hydronephrosis. EXAM: RENAL / URINARY TRACT ULTRASOUND COMPLETE COMPARISON:  06/18/2015 ultrasound. FINDINGS: Right Kidney: Length: 18.5 cm. Increased renal echogenicity noted. A 12 cm simple appearing right renal cyst is again identified. Mild -moderate right hydronephrosis does not appear significantly changed. Left Kidney: Length: 13.3 cm. A 13 cm complex left renal cyst is again identified. Echogenicity is mildly increased. Mild -moderate left hydronephrosis does not appear significantly changed. Bladder: Debris and calculi within the bladder again noted. IMPRESSION: No significant change in mild -moderate bilateral hydronephrosis. Unchanged 13 cm complex left renal cyst. Debris and calculi within the bladder again noted. Electronically Signed   By: Harmon Pier M.D.   On: 06/23/2015 15:45   US Renal  06/18/2015  CLINICAL DATA:  Acute renal failure. Follow-up bilateral hydronephrosis. EXAM: RENAL / URINARY TRACT  ULTRASOUND COMPLETE COMPARISON:  06/13/2015. FINDINGS: Right Kidney: Length: 12.6 cm. Normal echogenicity. Stable dilatation of the collecting system and previously described cysts. Left Kidney: Length: 12.7 cm. Normal echogenicity. Stable dilatation of the collecting system. Stable large complex cyst. Bladder: Interval Foley catheter.  Stable debris and  multiple calculi. IMPRESSION: 1. Stable mild bilateral hydronephrosis. 2. Stable large, complex left renal cyst concerning for the possibility of a cystic neoplasm. 3. Stable debris and calculi in the bladder. Electronically Signed   By: Beckie Salts M.D.   On: 06/18/2015 14:51   US Renal  06/13/2015  CLINICAL DATA:  Renal failure EXAM: RENAL / URINARY TRACT ULTRASOUND COMPLETE COMPARISON:  None. FINDINGS: Right Kidney: Length: 12.6 cm. Normal echogenicity. Mild hydronephrosis. There is a 12 cm renal cyst and a 3 cm renal cyst, both of which appear relatively simple. Left Kidney: Length: 12.7 cm. Mild hydronephrosis. Normal echogenicity. 14 cm cysts with numerous internal septations and wall thickening. Bladder: Numerous bladder calculi identified. There is debris layering within the bladder and the largest calculus measures about 13 mm. Prostate is enlarged at 5.4 cm. Incidentally detected is a wall echo shadow complex involving the gallbladder indicating cholelithiasis. IMPRESSION: 1. Extensive cholelithiasis 2. Enlarged prostate 3. Bladder calculi 4. Large bilateral renal cysts. On the left, a 14 cm cyst shows a complex appearance and malignancy is not excluded. Further evaluation with renal protocol CT scan or MRI suggested. 5. Mild bilateral hydronephrosis Electronically Signed   By: Esperanza Heir M.D.   On: 06/13/2015 18:48   Ir Fluoro Guide Cv Line Right  06/16/2015  INDICATION: Renal failure and needs hemodialysis. Plan for placement of non tunneled catheter due to leukocytosis and possible urinary tract infection. EXAM: FLUOROSCOPIC AND ULTRASOUND GUIDED PLACEMENT OF A NON TUNNELED DIALYSIS CATHETER Physician: Rachelle Hora. Henn, MD MEDICATIONS: None ANESTHESIA/SEDATION: None FLUOROSCOPY TIME:  Fluoroscopy Time: 24 seconds, 1.3 mGy COMPLICATIONS: None immediate. PROCEDURE: Informed consent was obtained for placement of a tunneled dialysis catheter. The patient was placed supine on the interventional  table. Ultrasound confirmed a patent right internal jugular vein. Ultrasound images were obtained for documentation. The right side of the neck was prepped and draped in a sterile fashion. The right side of the neck was anesthetized with 1% lidocaine. Maximal barrier sterile technique was utilized including caps, mask, sterile gowns, sterile gloves, sterile drape, hand hygiene and skin antiseptic. A small incision was made with #11 blade scalpel. A 21 gauge needle directed into the right internal jugular vein with ultrasound guidance. A micropuncture dilator set was placed. J wire was advanced into the central veins. A 20 cm Trialysis catheter was advanced over the wire and positioned at the superior cavoatrial junction. Both dialysis lumens were found to aspirate and flush well. The proper amount of heparin was placed in both lumens. The central lumen aspirated and flushed well. Central lumen was flushed with normal saline. The catheter was secured to the skin using Prolene suture. Fluoroscopic and ultrasound images were taken and saved for documentation. FINDINGS: Catheter tip at the superior cavoatrial junction. IMPRESSION: Successful placement of a non tunneled dialysis catheter using ultrasound and fluoroscopic guidance. Electronically Signed   By: Richarda Overlie M.D.   On: 06/16/2015 12:32   Ir US Guide Vasc Access Right  06/16/2015  INDICATION: Renal failure and needs hemodialysis. Plan for placement of non tunneled catheter due to leukocytosis and possible urinary tract infection. EXAM: FLUOROSCOPIC AND ULTRASOUND GUIDED PLACEMENT OF A NON TUNNELED DIALYSIS CATHETER Physician: Rachelle Hora. Lowella Dandy,  MD MEDICATIONS: None ANESTHESIA/SEDATION: None FLUOROSCOPY TIME:  Fluoroscopy Time: 24 seconds, 1.3 mGy COMPLICATIONS: None immediate. PROCEDURE: Informed consent was obtained for placement of a tunneled dialysis catheter. The patient was placed supine on the interventional table. Ultrasound confirmed a patent right  internal jugular vein. Ultrasound images were obtained for documentation. The right side of the neck was prepped and draped in a sterile fashion. The right side of the neck was anesthetized with 1% lidocaine. Maximal barrier sterile technique was utilized including caps, mask, sterile gowns, sterile gloves, sterile drape, hand hygiene and skin antiseptic. A small incision was made with #11 blade scalpel. A 21 gauge needle directed into the right internal jugular vein with ultrasound guidance. A micropuncture dilator set was placed. J wire was advanced into the central veins. A 20 cm Trialysis catheter was advanced over the wire and positioned at the superior cavoatrial junction. Both dialysis lumens were found to aspirate and flush well. The proper amount of heparin was placed in both lumens. The central lumen aspirated and flushed well. Central lumen was flushed with normal saline. The catheter was secured to the skin using Prolene suture. Fluoroscopic and ultrasound images were taken and saved for documentation. FINDINGS: Catheter tip at the superior cavoatrial junction. IMPRESSION: Successful placement of a non tunneled dialysis catheter using ultrasound and fluoroscopic guidance. Electronically Signed   By: Richarda Overlie M.D.   On: 06/16/2015 12:32   Dg Chest Port 1 View  06/26/2015  CLINICAL DATA:  Fever last night EXAM: PORTABLE CHEST 1 VIEW COMPARISON:  06/19/2015 FINDINGS: Consolidation in the left lower lobe concerning for pneumonia. Right basilar atelectasis or infiltrate. Heart is normal size. No effusions. No real change since prior study. IMPRESSION: Bibasilar opacities, left greater than right, concerning for pneumonia on the left. No real change. Electronically Signed   By: Charlett Nose M.D.   On: 06/26/2015 08:23   Dg Abd 2 Views  06/13/2015  CLINICAL DATA:  Shortness of breath for 5 days, constipation for 5 days, weakness EXAM: ABDOMEN - 2 VIEW COMPARISON:  None FINDINGS: Bronchitic changes with  subsegmental atelectasis at LEFT base. Normal bowel gas pattern. No bowel dilatation, bowel wall thickening or free intraperitoneal air. Probable nipple shadows on upright view. Numerous pelvic phleboliths. No acute osseous findings or urinary tract calcification. IMPRESSION: No acute abdominal findings. Electronically Signed   By: Ulyses Southward M.D.   On: 06/13/2015 17:32    Microbiology: Recent Results (from the past 240 hour(s))  Culture, blood (Routine X 2) w Reflex to ID Panel     Status: None   Collection Time: 06/19/15  3:06 PM  Result Value Ref Range Status   Specimen Description BLOOD RIGHT ANTECUBITAL  Final   Special Requests BOTTLES DRAWN AEROBIC ONLY 5CC  Final   Culture NO GROWTH 5 DAYS  Final   Report Status 06/24/2015 FINAL  Final  Culture, blood (Routine X 2) w Reflex to ID Panel     Status: None   Collection Time: 06/19/15  3:12 PM  Result Value Ref Range Status   Specimen Description BLOOD LEFT ANTECUBITAL  Final   Special Requests BOTTLES DRAWN AEROBIC ONLY 5CC  Final   Culture NO GROWTH 5 DAYS  Final   Report Status 06/24/2015 FINAL  Final  Culture, Urine     Status: None   Collection Time: 06/26/15  2:29 PM  Result Value Ref Range Status   Specimen Description URINE, CATHETERIZED  Final   Special Requests rocephin  Final  Culture NO GROWTH 1 DAY  Final   Report Status 06/27/2015 FINAL  Final     Labs: Basic Metabolic Panel:  Recent Labs Lab 06/22/15 0613 06/23/15 0706 06/25/15 0932 06/26/15 0622 06/28/15 0730  NA 136 136 136 139 140  K 4.9 4.7 4.2 4.4 4.0  CL 97* 98* 99* 104 107  CO2 24 24 23 23 23   GLUCOSE 87 110* 148* 114* 183*  BUN 38* 57* 40* 46* 36*  CREATININE 7.85* 10.12* 4.81* 4.61* 2.80*  CALCIUM 7.7* 7.7* 8.0* 7.8* 7.9*  PHOS 6.3*  --   --  4.9* 3.9   Liver Function Tests:  Recent Labs Lab 06/22/15 0613 06/26/15 0622 06/28/15 0730  ALBUMIN 1.5* 1.5* 1.4*   No results for input(s): LIPASE, AMYLASE in the last 168 hours. No  results for input(s): AMMONIA in the last 168 hours. CBC:  Recent Labs Lab 06/22/15 0614 06/23/15 0706 06/24/15 0530 06/25/15 0602 06/26/15 0622  WBC 20.9* 18.2* 15.9* 13.8* 13.6*  NEUTROABS  --  15.7*  --   --   --   HGB 8.9* 9.1* 8.5* 8.7* 8.5*  HCT 26.7* 26.8* 25.8* 27.3* 26.5*  MCV 85.6 85.6 84.0 85.6 85.2  PLT 205 240 251 284 325   Cardiac Enzymes: No results for input(s): CKTOTAL, CKMB, CKMBINDEX, TROPONINI in the last 168 hours. BNP: BNP (last 3 results) No results for input(s): BNP in the last 8760 hours.  ProBNP (last 3 results) No results for input(s): PROBNP in the last 8760 hours.  CBG: No results for input(s): GLUCAP in the last 168 hours.     Signed:  Eddie North MD.  [facility] 06/28/2015, 2:40 PM

## 2015-06-28 NOTE — Progress Notes (Signed)
Physical Therapy Treatment Patient Details Name: Jeff Parks MRN: [id number] DOB: 1929/12/15 Today's Date: 06/28/2015    History of Present Illness Jeff Parks is a 82 y.o. male with no previous medical problems who presents to the ED with complaints of Urinary retention x 3- 4 days. He reports not being able to urinate for the past 12 hours. He began to have fever today as well. He was evaluated in the ED, and a foley catheter was placed in the ED, and his BUN/Cr was elevated, and a Renal US was ordered and was admitted. Dx: Renal failure, urinary retention, Fever, anorexia, anemia, leukocytosis - due to early sepsis, HTN.    PT Comments    Patient seen for mobility progression. Tolerated activity and ambulation with less physical assist this session. Noted improvements in cognition. Overall patient progressing well. Will continue to see and progress as tolerated.    Follow Up Recommendations  SNF;Supervision/Assistance - 24 hour     Equipment Recommendations  None recommended by PT    Recommendations for Other Services OT consult     Precautions / Restrictions Precautions Precautions: Fall Restrictions Weight Bearing Restrictions: No    Mobility  Bed Mobility Overal bed mobility: Needs Assistance;+2 for physical assistance Bed Mobility: Sit to Supine       Sit to supine: Min guard   General bed mobility comments: Min guard for safety and cues for positioning  Transfers Overall transfer level: Needs assistance Equipment used: Rolling walker (2 wheeled) Transfers: Sit to/from Stand Sit to Stand: Min assist         General transfer comment: min assist for stability upon elevating to standing  Ambulation/Gait Ambulation/Gait assistance: Min guard Ambulation Distance (Feet): 120 Feet Assistive device: Rolling walker (2 wheeled) Gait Pattern/deviations: Step-through pattern;Decreased stride length;Drifts right/left;Narrow base of support Gait velocity:  slow Gait velocity interpretation: Below normal speed for age/gender General Gait Details: Required VCs and tactile cues for posture and stride. Moderate assist to maintain balance and upright positioning. Assist for stabilizing RW especially during turns. Noted several incidence of scissoring gait with patient stepping on his own feet   Stairs            Wheelchair Mobility    Modified Rankin (Stroke Patients Only)       Balance   Sitting-balance support: Feet supported Sitting balance-Leahy Scale: Fair       Standing balance-Leahy Scale: Fair Standing balance comment: reliance on RW                    Cognition Arousal/Alertness: Awake/alert Behavior During Therapy: Flat affect Overall Cognitive Status: Within Functional Limits for tasks assessed                 General Comments: Improvements noted in cognition this session    Exercises      General Comments        Pertinent Vitals/Pain      Home Living                      Prior Function            PT Goals (current goals can now be found in the care plan section) Acute Rehab PT Goals Patient Stated Goal: none stated PT Goal Formulation: With family Time For Goal Achievement: 06/29/15 Potential to Achieve Goals: Good Progress towards PT goals: Progressing toward goals    Frequency  Min 3X/week    PT Plan Current plan remains appropriate  Co-evaluation             End of Session Equipment Utilized During Treatment: Gait belt Activity Tolerance: Patient limited by lethargy Patient left: in bed;Other (comment) (with transport)     Time: 1610-9604 PT Time Calculation (min) (ACUTE ONLY): 16 min  Charges:  $Gait Training: 8-22 mins                    G CodesFabio Asa 07-03-2015, 1:18 PM Charlotte Crumb, PT DPT  [phone]

## 2015-06-28 NOTE — Clinical Social Work Placement (Signed)
   CLINICAL SOCIAL WORK PLACEMENT  NOTE 06/28/15 - DISCHARGED TO [facility]   Date:  06/28/2015  Patient Details  Name: Jeff Parks MRN: [id number] Date of Birth: [birth year]  Clinical Social Work is seeking post-discharge placement for this patient at the Skilled  Nursing Facility level of care (*CSW will initial, date and re-position this form in  chart as items are completed):  Yes   Patient/family provided with [facility] Clinical Social Work Department's list of facilities offering this level of care within the geographic area requested by the patient (or if unable, by the patient's family).  Yes   Patient/family informed of their freedom to choose among providers that offer the needed level of care, that participate in Medicare, Medicaid or managed care program needed by the patient, have an available bed and are willing to accept the patient.  Yes   Patient/family informed of [facility]'s ownership interest in [facility] and [facility], as well as of the fact that they are under no obligation to receive care at these facilities.  PASRR submitted to EDS on       PASRR number received on 06/16/15     Existing PASRR number confirmed on 06/16/15     FL2 transmitted to all facilities in geographic area requested by pt/family on 06/16/15     FL2 transmitted to all facilities within larger geographic area on 06/16/15     Patient informed that his/her managed care company has contracts with or will negotiate with certain facilities, including the following:         YES - Patient/family informed of bed offers received.  Patient chooses bed at  [facility]     Physician recommends and patient chooses bed at      Patient to be transferred to  [facility] on  06/28/15.  Patient to be transferred to facility by  ambulance     Patient family notified on  06/28/15 of transfer.  Name of family member notified:   Wife Jerrye Beavers [phone])    PHYSICIAN Please sign FL2,  Please prepare priority discharge summary, including medications     Additional Comment:    _______________________________________________ Cristobal Goldmann, LCSW 06/28/2015, 5:09 PM

## 2015-06-28 NOTE — Clinical Documentation Improvement (Signed)
  Hospitalist  Would you please clarify if acute renal failure is a condition of the sepsis confirmed on 2/15?   Acute Renal Failure related to sepsis   Other  Clinically Undetermined    Please exercise your independent, professional judgment when responding. A specific answer is not anticipated or expected.   Thank Modesta Messing [facility] Health Information Management [place] [phone]

## 2015-06-28 NOTE — Progress Notes (Signed)
Jeff Parks to be D/C'd Skilled nursing facility per MD order.  Discussed prescriptions and follow up appointments with the patient. Prescriptions given to patient, medication list explained in detail. Pt verbalized understanding.    Medication List    STOP taking these medications        naproxen sodium 220 MG tablet  Commonly known as:  ANAPROX      TAKE these medications        feeding supplement (NEPRO CARB STEADY) Liqd  Take 237 mLs by mouth 2 (two) times daily between meals.     levofloxacin 500 MG tablet  Commonly known as:  LEVAQUIN  Take 1 tablet (500 mg total) by mouth every other day.     MULTIVITAMIN ADULT PO  Take 1 tablet by mouth daily.     penicillin G benzathine-penicillin G procaine 900000-300000 UNIT/2ML Susp  Commonly known as:  BICILLIN-CR  Inject 2 mLs (1.2 Million Units total) into the muscle once a week.  Start taking on:  06/30/2015        Filed Vitals:   06/28/15 0618 06/28/15 0805  BP: 124/59 99/55  Pulse: 107 109  Temp: 98.4 F (36.9 C) 98.3 F (36.8 C)  Resp: 19 18    Skin clean, dry and intact without evidence of skin break down, no evidence of skin tears noted. IV catheter discontinued intact. Site without signs and symptoms of complications. Dressing and pressure applied. Pt denies pain at this time. No complaints noted.  An After Visit Summary was printed and given to the patient. Patient escorted via WC, and D/C home via private auto.  [name] BSN, RN

## 2015-06-28 NOTE — Progress Notes (Signed)
[facility] ROUNDING NOTE   Subjective:   Interval History:  No problems today  Objective:  Vital signs in last 24 hours:  Temp:  [98.3 F (36.8 C)-98.4 F (36.9 C)] 98.3 F (36.8 C) (02/28 0805) Pulse Rate:  [107-109] 109 (02/28 0805) Resp:  [18-19] 18 (02/28 0805) BP: (99-124)/(55-59) 99/55 mmHg (02/28 0805) SpO2:  [97 %-98 %] 98 % (02/28 0805)  Weight change:  Filed Weights   06/21/15 1123 06/23/15 0655 06/23/15 1100  Weight: 81.6 kg (179 lb 14.3 oz) 80.7 kg (177 lb 14.6 oz) 79.8 kg (175 lb 14.8 oz)    Intake/Output: I/O last 3 completed shifts: In: 1755 [P.O.:120; I.V.:1635] Out: 3100 [Urine:3100]   Intake/Output this shift:  Total I/O In: 240 [P.O.:240] Out: 250 [Urine:250]  CVS- RRR RS- CTA ABD- BS present soft non-distended EXT- no edema   Basic Metabolic Panel:  Recent Labs Lab 06/22/15 0613 06/23/15 0706 06/25/15 0932 06/26/15 0622 06/28/15 0730  NA 136 136 136 139 140  K 4.9 4.7 4.2 4.4 4.0  CL 97* 98* 99* 104 107  CO2 GLUCOSE 87 110* 148* 114* 183*  BUN 38* 57* 40* 46* 36*  CREATININE 7.85* 10.12* 4.81* 4.61* 2.80*  CALCIUM 7.7* 7.7* 8.0* 7.8* 7.9*  PHOS 6.3*  --   --  4.9* 3.9    Liver Function Tests:  Recent Labs Lab 06/22/15 0613 06/26/15 0622 06/28/15 0730  ALBUMIN 1.5* 1.5* 1.4*   No results for input(s): LIPASE, AMYLASE in the last 168 hours. No results for input(s): AMMONIA in the last 168 hours.  CBC:  Recent Labs Lab 06/22/15 0614 06/23/15 0706 06/24/15 0530 06/25/15 0602 06/26/15 0622  WBC 20.9* 18.2* 15.9* 13.8* 13.6*  NEUTROABS  --  15.7*  --   --   --   HGB 8.9* 9.1* 8.5* 8.7* 8.5*  HCT 26.7* 26.8* 25.8* 27.3* 26.5*  MCV 85.6 85.6 84.0 85.6 85.2  PLT 205 240 251 284 325    Cardiac Enzymes: No results for input(s): CKTOTAL, CKMB, CKMBINDEX, TROPONINI in the last 168 hours.  BNP: Invalid input(s): POCBNP  CBG: No results for input(s): GLUCAP in the last 168  hours.  Microbiology: Results for orders placed or performed during the hospital encounter of 06/13/15  Culture, blood (x 2)     Status: None   Collection Time: 06/13/15 11:03 PM  Result Value Ref Range Status   Specimen Description BLOOD LEFT ARM  Final   Special Requests BOTTLES DRAWN AEROBIC AND ANAEROBIC 5 CC  Final   Culture   Final    NO GROWTH 5 DAYS Performed at [facility]    Report Status 06/19/2015 FINAL  Final  Culture, blood (x 2)     Status: None   Collection Time: 06/13/15 11:04 PM  Result Value Ref Range Status   Specimen Description BLOOD RIGHT HAND  Final   Special Requests BOTTLES DRAWN AEROBIC AND ANAEROBIC 5 CC  Final   Culture   Final    NO GROWTH 5 DAYS Performed at [facility]    Report Status 06/19/2015 FINAL  Final  Culture, Urine     Status: None   Collection Time: 06/14/15  7:41 AM  Result Value Ref Range Status   Specimen Description URINE, RANDOM  Final   Special Requests NONE  Final   Culture   Final    NO GROWTH 1 DAY Performed at [facility]    Report Status 06/15/2015  FINAL  Final  Culture, blood (Routine X 2) w Reflex to ID Panel     Status: None   Collection Time: 06/19/15  3:06 PM  Result Value Ref Range Status   Specimen Description BLOOD RIGHT ANTECUBITAL  Final   Special Requests BOTTLES DRAWN AEROBIC ONLY 5CC  Final   Culture NO GROWTH 5 DAYS  Final   Report Status 06/24/2015 FINAL  Final  Culture, blood (Routine X 2) w Reflex to ID Panel     Status: None   Collection Time: 06/19/15  3:12 PM  Result Value Ref Range Status   Specimen Description BLOOD LEFT ANTECUBITAL  Final   Special Requests BOTTLES DRAWN AEROBIC ONLY 5CC  Final   Culture NO GROWTH 5 DAYS  Final   Report Status 06/24/2015 FINAL  Final  Culture, Urine     Status: None   Collection Time: 06/26/15  2:29 PM  Result Value Ref Range Status   Specimen Description URINE, CATHETERIZED  Final   Special Requests rocephin  Final   Culture NO  GROWTH 1 DAY  Final   Report Status 06/27/2015 FINAL  Final    Coagulation Studies: No results for input(s): LABPROT, INR in the last 72 hours.  Urinalysis:  Recent Labs  06/26/15 1429  COLORURINE YELLOW  LABSPEC 1.014  PHURINE 5.5  GLUCOSEU NEGATIVE  HGBUR LARGE*  BILIRUBINUR NEGATIVE  KETONESUR NEGATIVE  PROTEINUR 100*  NITRITE NEGATIVE  LEUKOCYTESUR LARGE*      Imaging: No results found.   Medications:     . azithromycin  500 mg Intravenous Daily  . calcium acetate  667 mg Oral TID WC  . cefTRIAXone (ROCEPHIN)  IV  1 g Intravenous Q24H  . docusate sodium  100 mg Oral BID  . feeding supplement (NEPRO CARB STEADY)  237 mL Oral BID BM  . multivitamin  1 tablet Oral QHS  . penicillin G benzathine-penicillin G procaine  1.2 Million Units Intramuscular Weekly   acetaminophen **OR** acetaminophen, ondansetron **OR** ondansetron (ZOFRAN) IV, polyethylene glycol, sodium chloride flush  Assessment/ Plan:  1. Renal failure with bilateral hydronephrosis on initial [place] (and was also taking NSAIDS) Unknown chronicity as no previous information on kidney function. Foley placed and initially 2L urine in the bladder, but no UOP since - then found out foley was in wrong place- with manipulation much UOP again ! Creatinine now improved  2. 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 Pathology/ ?? 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 Leukocytosis - WBC as high as 30,000 treated with azithromycin and rocephin 4.Mild anemia - Hgb decreasing stedily- is 9.1 this am Start Aranesp 60 q week ly hd 5.Bladder sludge/bladder stones - irrigating foley QShift (to make sure lack of urine output isn't related to bladder sludge occluding foley)- will need eradication at some point as well  6 Large complex renal cyst - cannot  exclude malignancy. Not the cause of his RF but will need eval at some point.. Urology aware Ultrasound performed 8. MBD - check iPTH - P 5.8>7.1> 6.0>6.3 this am  Started phoslo - last Ca 7.6 Corec ca 9.0 9. Nutrition - Alb 2.0 >1.8>1.5 - Ensure / renal diet - multivit / sl better appetite this am 10. Dispo- Nursing home placement and no dialysis at this point     LOS: 15 Jeff Parks W  :05 PM

## 2015-06-28 NOTE — Clinical Documentation Improvement (Signed)
Hospitalist   Would you please further clarify if sepsis was ruled in or out? Last documented note was in progress note on 06/14/15 as sepsis from UTI.   Sepsis ruled in  Sepsis ruled out  Other  Clinically Undetermined  Document any associated diagnoses/conditions. 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  Supporting Information: As on today WBC remains at 13.9 and has been as high as 29.3; as of today BUN is at 36 and creatinine is at 2.8 and GFR is 19.  Please exercise your independent, professional judgment when responding. A specific answer is not anticipated or expected.   Thank Modesta Messing [facility] Health Information Management [place] [phone]

## 2015-06-29 ENCOUNTER — Telehealth (HOSPITAL_BASED_OUTPATIENT_CLINIC_OR_DEPARTMENT_OTHER): Payer: Self-pay | Admitting: Emergency Medicine

## 2015-06-30 ENCOUNTER — Non-Acute Institutional Stay (SKILLED_NURSING_FACILITY): Payer: Self-pay | Admitting: Internal Medicine

## 2015-06-30 ENCOUNTER — Encounter: Payer: Self-pay | Admitting: Internal Medicine

## 2015-06-30 DIAGNOSIS — J181 Lobar pneumonia, unspecified organism: Secondary | ICD-10-CM

## 2015-06-30 DIAGNOSIS — R5381 Other malaise: Secondary | ICD-10-CM

## 2015-06-30 DIAGNOSIS — N390 Urinary tract infection, site not specified: Secondary | ICD-10-CM

## 2015-06-30 DIAGNOSIS — N210 Calculus in bladder: Secondary | ICD-10-CM

## 2015-06-30 DIAGNOSIS — N179 Acute kidney failure, unspecified: Secondary | ICD-10-CM

## 2015-06-30 DIAGNOSIS — R4189 Other symptoms and signs involving cognitive functions and awareness: Secondary | ICD-10-CM

## 2015-06-30 DIAGNOSIS — E46 Unspecified protein-calorie malnutrition: Secondary | ICD-10-CM

## 2015-06-30 DIAGNOSIS — D638 Anemia in other chronic diseases classified elsewhere: Secondary | ICD-10-CM

## 2015-06-30 DIAGNOSIS — A530 Latent syphilis, unspecified as early or late: Secondary | ICD-10-CM

## 2015-06-30 DIAGNOSIS — R160 Hepatomegaly, not elsewhere classified: Secondary | ICD-10-CM

## 2015-06-30 NOTE — Progress Notes (Signed)
LOCATION: [facility]  PCP: No primary care provider on file.   Code Status: Full Code  Goals of care: Advanced Directive information Advanced Directives 06/13/2015  Does patient have an advance directive? No     Extended Emergency Contact Information Primary Emergency Contact: Meddings,Hazel Address: [address]          [place] [address] [place] Home Phone: [phone] Mobile Phone: [phone] Relation: Spouse   No Known Allergies  Chief Complaint  Patient presents with  . New Admit To SNF    New Admission     HPI:  Patient is a 82 y.o. male seen today for short term rehabilitation post hospital admission from 06/13/15-06/28/15 with altered mental status and progressive weakness with decreased urine output. He was noted to have acute kidney injury with uremic acidosis, bilateral hydronephrosis and bladder outlet obstruction. He had a foley catheter placed and was given iv fluids. He had UTI and pneumonia and was treated with antibiotics. He underwent dialysis. He was seen by urology and nephrology. 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 He was given a dose of benzathine penicillin. He is seen in his room today. He appears to be somewhat confused and denies any concern this visit.    Review of Systems:  Constitutional: Negative for fever, chills  HENT: Negative for headache, congestion, nasal discharge, difficulty swallowing.   Eyes: Negative for blurred vision, double vision and discharge.  Respiratory: Negative for cough, shortness of breath and wheezing.   Cardiovascular: Negative for chest pain, palpitations, leg swelling.  Gastrointestinal: Negative for heartburn, nausea, vomiting, abdominal pain. Had bowel movement today Genitourinary: has foley catheter and denies flank pain Musculoskeletal: Negative for fall  Skin: Negative for itching, rash.  Neurological: Negative for dizziness, tingling. Psychiatric/Behavioral: Negative  for depression   Past Medical History  Diagnosis Date  . Constipation    No past surgical history on file. Social History:   reports that he has never smoked. He does not have any smokeless tobacco history on file. He reports that he does not drink alcohol or use illicit drugs.  No family history on file.  Medications:   Medication List       This list is accurate as of: 06/30/15 12:10 PM.  Always use your most recent med list.               feeding supplement (NEPRO CARB STEADY) Liqd  Take 237 mLs by mouth 2 (two) times daily between meals.     levofloxacin 500 MG tablet  Commonly known as:  LEVAQUIN  Take 1 tablet (500 mg total) by mouth every other day.     MULTIVITAMIN ADULT PO  Take 1 tablet by mouth daily.         Physical Exam: Filed Vitals:   06/30/15 1154  BP: 113/60  Pulse: 80  Temp: 98.1 F (36.7 C)  TempSrc: Oral  Resp: 20  Height: 6' (1.829 m)  Weight: 179 lb (81.194 kg)  SpO2: 96%   Body mass index is 24.27 kg/(m^2).  General- elderly male, well built, in no acute distress Head- normocephalic, atraumatic Nose- no maxillary or frontal sinus tenderness, no nasal discharge Throat- moist mucus membrane, adentulous Eyes- PERRLA, EOMI, no pallor, no icterus Neck- no cervical lymphadenopathy Cardiovascular- normal s1,s2, no murmur, no leg edema Respiratory- bilateral clear to auscultation, no wheeze, no rhonchi, no crackles, no use of accessory muscles Abdomen- bowel sounds present, soft, non tender Musculoskeletal-  able to move all 4 extremities, generalized weakness Neurological- alert and oriented to person only Skin- warm and dry Psychiatry- normal mood and affect    Labs reviewed: Basic Metabolic Panel:  Recent Labs  16/10/96 0613  06/25/15 0932 06/26/15 0622 06/28/15 06/28/15 0730  NA 136  < > 136 139 140 140  K 4.9  < > 4.2 4.4  --  4.0  CL 97*  < > 99* 104  --  107  CO2 24  < > 23 23  --  23  GLUCOSE 87  < > 148* 114*  --   183*  BUN 38*  < > 40* 46* 36* 36*  CREATININE 7.85*  < > 4.81* 4.61* 2.8* 2.80*  CALCIUM 7.7*  < > 8.0* 7.8*  --  7.9*  PHOS 6.3*  --   --  4.9*  --  3.9  < > = values in this interval not displayed. Liver Function Tests:  Recent Labs  06/13/15 1116 06/15/15 0437  06/19/15 1530  06/22/15 0613 06/26/15 0622 06/28/15 0730  AST 40 29  --  38  --   --   --   --   ALT 62 43  --  32  --   --   --   --   ALKPHOS 203* 133*  --  90  --   --   --   --   BILITOT 1.3* 0.9  --  0.3  --   --   --   --   PROT 8.0 6.6  --  6.3*  --   --   --   --   ALBUMIN 2.6* 2.0*  < > 1.5*  < > 1.5* 1.5* 1.4*  < > = values in this interval not displayed.  Recent Labs  06/13/15 1116 06/15/15 1152  LIPASE 54* 49    Recent Labs  06/19/15 1530  AMMONIA 45*   CBC:  Recent Labs  06/17/15 1511  06/23/15 0706 06/24/15 0530 06/25/15 0602 06/26/15 06/26/15 0622  WBC 30.9*  < > 18.2* 15.9* 13.8* 13.6 13.6*  NEUTROABS 27.8*  --  15.7*  --   --   --   --   HGB 10.1*  < > 9.1* 8.5* 8.7*  --  8.5*  HCT 29.7*  < > 26.8* 25.8* 27.3*  --  26.5*  MCV 81.6  < > 85.6 84.0 85.6  --  85.2  PLT 307  < > 240 251 284  --  325  < > = values in this interval not displayed. Cardiac Enzymes: No results for input(s): CKTOTAL, CKMB, CKMBINDEX, TROPONINI in the last 8760 hours. BNP: Invalid input(s): POCBNP CBG: No results for input(s): GLUCAP in the last 8760 hours.  Radiological Exams: Ct Abdomen Pelvis Wo Contrast  06/19/2015  CLINICAL DATA:  Bilateral hydronephrosis on recent ultrasounds. Renal insufficiency. Abdominal pain. Bloody stools. EXAM: CT ABDOMEN AND PELVIS WITHOUT CONTRAST TECHNIQUE: Multidetector CT imaging of the abdomen and pelvis was performed following the standard protocol without IV contrast. COMPARISON:  Renal ultrasound obtained yesterday FINDINGS: The examination is limited by streak artifact produced by the patient's arms. Lower chest: Small to moderate-sized left pleural effusion and small  right pleural effusion. Bilateral lower lobe atelectasis, greater on the left. Hepatobiliary: Large number of small gallstones in the gallbladder. The largest measures 5 mm. No gallbladder wall thickening or pericholecystic fluid. Four left lobe liver cysts. There is also a 3.8 x 2.7 cm oval, oval area of low  density in the anterior segment of the right lobe of the liver measuring 26 Hounsfield units in density on image 10. Pancreas: No mass or inflammatory process identified on this un-enhanced exam. Spleen: Within normal limits in size. Adrenals/Urinary Tract: Normal appearing right adrenal gland. The left adrenal gland is poorly visualized due to adjacent soft tissue stranding. No gross left adrenal abnormality seen. Again demonstrated is a large upper pole left renal cyst with diffuse, irregular wall thickening and a thin internal septation. This measures 13.2 cm in maximum diameter on image number 25 of series 2. There is surrounding soft tissue stranding. A smaller lower pole left renal cyst is noted. A large lower pole right renal cyst is demonstrated with a more simple appearance, measuring 12.1 cm in maximum diameter. There is a small linear calcification along the wall of this cyst laterally. There are 2 additional smaller right renal cysts. Mild to moderate dilatation of both renal collecting systems. Moderate dilatation of the left ureter to the ureterovesical junction. Mild to moderate dilatation of the right ureter to the level of the ureterovesical junction. No obstructing stones seen at those locations. There are multiple bladder calculi measuring up to 1.2 cm in maximum diameter each. There is also air in the urinary bladder and mild diffuse bladder wall thickening. A Foley catheter is in place. Stomach/Bowel: Multiple colonic diverticula without evidence of diverticulitis. No evidence of appendicitis. No gastric or small bowel abnormalities. Vascular/Lymphatic: Atheromatous arterial calcifications.  No enlarged lymph nodes. Reproductive: Moderately enlarged prostate gland. Other: Small supraumbilical hernia containing fat. Musculoskeletal: Lumbar and lower thoracic spine degenerative changes. Mild right hip degenerative changes. Minimal left hip degenerative changes. IMPRESSION: 1. Limited examination due to the lack of intravenous and oral contrast and due to streak artifacts produced by the patient's arms. 2. 13.2 cm complex left renal cyst with surrounding soft tissue stranding. This could represent an infected cyst. A cystic neoplasm is also possible. 3. Followup 0.1 cm minimally complex right renal cyst. 4. Mild to moderate bilateral hydronephrosis and bilateral hydroureter wrist new levels of the ureterovesical junctions with no obstructing calculus or mass seen. 5. Multiple bladder calculi. 6. Moderately enlarged prostate gland. 7. Mild diffuse bladder wall thickening, compatible with chronic bladder outlet obstruction. 8. Bilateral pleural effusions and bilateral lower lobe atelectasis. 9. Cholelithiasis. 10. 3.8 cm right lobe liver mass. This may represent a complicated cyst or solid mass. 11. Small supraumbilical hernia containing fat. Electronically Signed   By: Beckie Salts M.D.   On: 06/19/2015 13:20   Dg Chest 2 View  06/19/2015  CLINICAL DATA:  Leukocytosis.  Abdominal pain. EXAM: CHEST  2 VIEW COMPARISON:  06/13/2015. FINDINGS: Poor inspiration. No gross change in a normal sized heart. Mildly increased left basilar atelectasis. Interval right basilar atelectasis. Thoracic spine degenerative changes. Interval right jugular catheter with its tip in the inferior right atrium. No pneumothorax. IMPRESSION: 1. Right jugular catheter tip in the inferior right atrium. This could be retracted 6.5 cm to place in the superior vena cava. 2. Mildly increased left basilar atelectasis with interval mild to moderate right basilar atelectasis. Electronically Signed   By: Beckie Salts M.D.   On: 06/19/2015  13:28   Dg Chest 2 View  06/13/2015  CLINICAL DATA:  Shortness of breath for 5 days, constipation for 5 days, weakness EXAM: CHEST  2 VIEW COMPARISON:  None FINDINGS: Normal heart size and pulmonary vascularity. Tortuous thoracic aorta. Bronchitic changes with LEFT basilar atelectasis. Remaining lungs clear. No definite pleural  effusion or pneumothorax. IMPRESSION: Bronchitic changes with LEFT basilar atelectasis. Electronically Signed   By: Ulyses Southward M.D.   On: 06/13/2015 17:31   Ct Head Wo Contrast  06/20/2015  CLINICAL DATA:  Acute onset of somnolence. Altered mental status. Initial encounter. EXAM: CT HEAD WITHOUT CONTRAST TECHNIQUE: Contiguous axial images were obtained from the base of the skull through the vertex without intravenous contrast. COMPARISON:  None. FINDINGS: There is no evidence of acute infarction, mass lesion, or intra- or extra-axial hemorrhage on CT. Mild periventricular white matter change likely reflects small vessel ischemic microangiopathy. The posterior fossa, including the cerebellum, brainstem and fourth ventricle, is within normal limits. The third and lateral ventricles, and basal ganglia are unremarkable in appearance. The cerebral hemispheres are symmetric in appearance, with normal gray-white differentiation. No mass effect or midline shift is seen. There is no evidence of fracture; visualized osseous structures are unremarkable in appearance. The visualized portions of the orbits are within normal limits. The paranasal sinuses and mastoid air cells are well-aerated. No significant soft tissue abnormalities are seen. IMPRESSION: 1. No acute intracranial pathology seen on CT. 2. Mild small vessel ischemic microangiopathy. Electronically Signed   By: Roanna Raider M.D.   On: 06/20/2015 02:12   US Renal  06/28/2015  CLINICAL DATA:  Followup hydronephrosis. EXAM: RENAL / URINARY TRACT ULTRASOUND COMPLETE COMPARISON:  06/23/2015 and CT 06/19/2015 FINDINGS: Right Kidney:  Length: 13.2 cm. Stable mild hydronephrosis. Stable cysts with the largest measuring 11.8 cm. Left Kidney: Length: 4.9 cm. Stable mild hydronephrosis. Again noted is a large complex cyst with dependent debris as this measures slightly larger with greatest diameter 14.9 cm (previously 12.8 cm). Bladder: Foley catheter is present within a decompressed bladder. Several echogenic foci unchanged likely known bladder stones versus air. IMPRESSION: Normal size kidneys with persistent mild bilateral hydronephrosis. Slight increase in size of known complex left renal cyst measuring 14.9 cm (previously 12.8 cm). Stable large right renal cyst measuring 11.8 cm. Foley catheter within a decompressed bladder. Echogenic foci within the bladder compatible with known stones versus air. Electronically Signed   By: Elberta Fortis M.D.   On: 06/28/2015 12:05   US Renal  06/23/2015  CLINICAL DATA:  82 year old male with acute kidney injury and urinary retention. Followup hydronephrosis. EXAM: RENAL / URINARY TRACT ULTRASOUND COMPLETE COMPARISON:  06/18/2015 ultrasound. FINDINGS: Right Kidney: Length: 18.5 cm. Increased renal echogenicity noted. A 12 cm simple appearing right renal cyst is again identified. Mild -moderate right hydronephrosis does not appear significantly changed. Left Kidney: Length: 13.3 cm. A 13 cm complex left renal cyst is again identified. Echogenicity is mildly increased. Mild -moderate left hydronephrosis does not appear significantly changed. Bladder: Debris and calculi within the bladder again noted. IMPRESSION: No significant change in mild -moderate bilateral hydronephrosis. Unchanged 13 cm complex left renal cyst. Debris and calculi within the bladder again noted. Electronically Signed   By: Harmon Pier M.D.   On: 06/23/2015 15:45   US Renal  06/18/2015  CLINICAL DATA:  Acute renal failure. Follow-up bilateral hydronephrosis. EXAM: RENAL / URINARY TRACT ULTRASOUND COMPLETE COMPARISON:  06/13/2015.  FINDINGS: Right Kidney: Length: 12.6 cm. Normal echogenicity. Stable dilatation of the collecting system and previously described cysts. Left Kidney: Length: 12.7 cm. Normal echogenicity. Stable dilatation of the collecting system. Stable large complex cyst. Bladder: Interval Foley catheter.  Stable debris and multiple calculi. IMPRESSION: 1. Stable mild bilateral hydronephrosis. 2. Stable large, complex left renal cyst concerning for the possibility of a cystic neoplasm. 3.  Stable debris and calculi in the bladder. Electronically Signed   By: Beckie Salts M.D.   On: 06/18/2015 14:51   US Renal  06/13/2015  CLINICAL DATA:  Renal failure EXAM: RENAL / URINARY TRACT ULTRASOUND COMPLETE COMPARISON:  None. FINDINGS: Right Kidney: Length: 12.6 cm. Normal echogenicity. Mild hydronephrosis. There is a 12 cm renal cyst and a 3 cm renal cyst, both of which appear relatively simple. Left Kidney: Length: 12.7 cm. Mild hydronephrosis. Normal echogenicity. 14 cm cysts with numerous internal septations and wall thickening. Bladder: Numerous bladder calculi identified. There is debris layering within the bladder and the largest calculus measures about 13 mm. Prostate is enlarged at 5.4 cm. Incidentally detected is a wall echo shadow complex involving the gallbladder indicating cholelithiasis. IMPRESSION: 1. Extensive cholelithiasis 2. Enlarged prostate 3. Bladder calculi 4. Large bilateral renal cysts. On the left, a 14 cm cyst shows a complex appearance and malignancy is not excluded. Further evaluation with renal protocol CT scan or MRI suggested. 5. Mild bilateral hydronephrosis Electronically Signed   By: Esperanza Heir M.D.   On: 06/13/2015 18:48   Ir Fluoro Guide Cv Line Right  06/16/2015  INDICATION: Renal failure and needs hemodialysis. Plan for placement of non tunneled catheter due to leukocytosis and possible urinary tract infection. EXAM: FLUOROSCOPIC AND ULTRASOUND GUIDED PLACEMENT OF A NON TUNNELED DIALYSIS  CATHETER Physician: Rachelle Hora. Henn, MD MEDICATIONS: None ANESTHESIA/SEDATION: None FLUOROSCOPY TIME:  Fluoroscopy Time: 24 seconds, 1.3 mGy COMPLICATIONS: None immediate. PROCEDURE: Informed consent was obtained for placement of a tunneled dialysis catheter. The patient was placed supine on the interventional table. Ultrasound confirmed a patent right internal jugular vein. Ultrasound images were obtained for documentation. The right side of the neck was prepped and draped in a sterile fashion. The right side of the neck was anesthetized with 1% lidocaine. Maximal barrier sterile technique was utilized including caps, mask, sterile gowns, sterile gloves, sterile drape, hand hygiene and skin antiseptic. A small incision was made with #11 blade scalpel. A 21 gauge needle directed into the right internal jugular vein with ultrasound guidance. A micropuncture dilator set was placed. J wire was advanced into the central veins. A 20 cm Trialysis catheter was advanced over the wire and positioned at the superior cavoatrial junction. Both dialysis lumens were found to aspirate and flush well. The proper amount of heparin was placed in both lumens. The central lumen aspirated and flushed well. Central lumen was flushed with normal saline. The catheter was secured to the skin using Prolene suture. Fluoroscopic and ultrasound images were taken and saved for documentation. FINDINGS: Catheter tip at the superior cavoatrial junction. IMPRESSION: Successful placement of a non tunneled dialysis catheter using ultrasound and fluoroscopic guidance. Electronically Signed   By: Richarda Overlie M.D.   On: 06/16/2015 12:32   Ir US Guide Vasc Access Right  06/16/2015  INDICATION: Renal failure and needs hemodialysis. Plan for placement of non tunneled catheter due to leukocytosis and possible urinary tract infection. EXAM: FLUOROSCOPIC AND ULTRASOUND GUIDED PLACEMENT OF A NON TUNNELED DIALYSIS CATHETER Physician: Rachelle Hora. Henn, MD MEDICATIONS:  None ANESTHESIA/SEDATION: None FLUOROSCOPY TIME:  Fluoroscopy Time: 24 seconds, 1.3 mGy COMPLICATIONS: None immediate. PROCEDURE: Informed consent was obtained for placement of a tunneled dialysis catheter. The patient was placed supine on the interventional table. Ultrasound confirmed a patent right internal jugular vein. Ultrasound images were obtained for documentation. The right side of the neck was prepped and draped in a sterile fashion. The right side of the neck  was anesthetized with 1% lidocaine. Maximal barrier sterile technique was utilized including caps, mask, sterile gowns, sterile gloves, sterile drape, hand hygiene and skin antiseptic. A small incision was made with #11 blade scalpel. A 21 gauge needle directed into the right internal jugular vein with ultrasound guidance. A micropuncture dilator set was placed. J wire was advanced into the central veins. A 20 cm Trialysis catheter was advanced over the wire and positioned at the superior cavoatrial junction. Both dialysis lumens were found to aspirate and flush well. The proper amount of heparin was placed in both lumens. The central lumen aspirated and flushed well. Central lumen was flushed with normal saline. The catheter was secured to the skin using Prolene suture. Fluoroscopic and ultrasound images were taken and saved for documentation. FINDINGS: Catheter tip at the superior cavoatrial junction. IMPRESSION: Successful placement of a non tunneled dialysis catheter using ultrasound and fluoroscopic guidance. Electronically Signed   By: Richarda Overlie M.D.   On: 06/16/2015 12:32   Dg Chest Port 1 View  06/26/2015  CLINICAL DATA:  Fever last night EXAM: PORTABLE CHEST 1 VIEW COMPARISON:  06/19/2015 FINDINGS: Consolidation in the left lower lobe concerning for pneumonia. Right basilar atelectasis or infiltrate. Heart is normal size. No effusions. No real change since prior study. IMPRESSION: Bibasilar opacities, left greater than right, concerning  for pneumonia on the left. No real change. Electronically Signed   By: Charlett Nose M.D.   On: 06/26/2015 08:23   Dg Abd 2 Views  06/13/2015  CLINICAL DATA:  Shortness of breath for 5 days, constipation for 5 days, weakness EXAM: ABDOMEN - 2 VIEW COMPARISON:  None FINDINGS: Bronchitic changes with subsegmental atelectasis at LEFT base. Normal bowel gas pattern. No bowel dilatation, bowel wall thickening or free intraperitoneal air. Probable nipple shadows on upright view. Numerous pelvic phleboliths. No acute osseous findings or urinary tract calcification. IMPRESSION: No acute abdominal findings. Electronically Signed   By: Ulyses Southward M.D.   On: 06/13/2015 17:32    Assessment/Plan  Physical deconditioning Will have him work with physical therapy and occupational therapy team to help with gait training and muscle strengthening exercises.fall precautions. Skin care. Encourage to be out of bed.   Cognitive impairment His UTI, deconditioning and memory issue are likely all contributing to this. Continue and complete antibiotic course. Check his lab work. Get SLP to evaluate for cognitive impairment and to teach safety measures  Acute renal impairment Has foley catheter in place. Avoid nephrotoxic agent  UTI Continue and complete levaquin 500 mg qod on 07/01/15. Hydration to be maintained  Lobar pneumonia Continue and complete levaquin on 07/01/15. Aspiration precautions  Bladder stone with sludge Has foley in place, continue foley care and to follow with urology  Leukocytosis With UTI and pneumonia, monitor cbc with diff  Latent syphilis Penicillin g not available in facility pharmacy and other pharmacies of [place] at the moment including [facility]. Per staff, medication is out of stock. Spoke with on call physician Dr Luciana Axe and he feels 1 dose should be suffice for now but recommends 3 weekly dosing as outpatient when medication is available. will have him get a new 3  weekly dosing of penicillin g as outpatient with his PCP.   Anemia of chronic disease Monitor cbc  Protein calorie malnutrition Continue nutritional supplement, get dietary consult and monitor weight. Continue MVI  Right liver mass hepatitis panel and AFP negative. Will need follow up as outpatient with repeat ct scan in 3 months with  PCP     Goals of care: short term rehabilitation   Labs/tests ordered: cbc, cmp  Family/ staff Communication: reviewed care plan with patient and nursing supervisor    Oneal Grout, MD Internal Medicine [facility] [address] [place], [place] [address] Cell Phone (Monday-Friday 8 am - 5 pm): [phone] On Call: [phone] and follow prompts after 5 pm and on weekends Office Phone: [phone] Office Fax: [phone]

## 2015-07-05 ENCOUNTER — Non-Acute Institutional Stay (SKILLED_NURSING_FACILITY): Payer: Self-pay | Admitting: Family

## 2015-07-05 DIAGNOSIS — D509 Iron deficiency anemia, unspecified: Secondary | ICD-10-CM

## 2015-07-05 DIAGNOSIS — R339 Retention of urine, unspecified: Secondary | ICD-10-CM

## 2015-07-05 DIAGNOSIS — D72829 Elevated white blood cell count, unspecified: Secondary | ICD-10-CM

## 2015-07-05 NOTE — Progress Notes (Signed)
Patient ID: Jeff Parks, male   DOB: Jul 27, 1929, 82 y.o.   MRN: [id number]  Location:  [facility]   Place of Service:  SNF [phone]) Provider:  Oneal Grout, MD   No primary care provider on file.  No care team member to display  Extended Emergency Contact Information Primary Emergency Contact: Mazzola,Hazel Address: [address]          [place] [address] [place] Home Phone: [phone] Mobile Phone: [phone] Relation: Spouse  Code Status: Full Code  Goals of care: Advanced Directive information Advanced Directives 06/13/2015  Does patient have an advance directive? No     Chief Complaint  Patient presents with  . Acute Visit    HPI:  Pt is a 82 y.o. male seen today at [facility] for an acute visit for possible bladder spasm. He has a history of Bladder outlet obstruction, Renal Failure, Protein- Calorie Malnutrition amongst others. He is seen in his room today lying in the bed watching TV. He denies any pain. He states Foley cathter has not been draining any urine. Facility staff reports 350 cc emptied in the morning. Patient is schedule for TURP surgery with [facility] with DR. Patsi Sears 07/15/2015.    Past Medical History  Diagnosis Date  . Constipation    No past surgical history on file.  No Known Allergies    Medication List       This list is accurate as of: 07/05/15  3:24 PM.  Always use your most recent med list.               feeding supplement (NEPRO CARB STEADY) Liqd  Take 237 mLs by mouth 2 (two) times daily between meals.     MULTIVITAMIN ADULT PO  Take 1 tablet by mouth daily.        Review of Systems  Constitutional: Negative for fever, chills, activity change, appetite change and fatigue.  HENT: Negative for congestion, sinus pressure, sneezing and sore throat.   Eyes: Negative.   Respiratory: Negative for cough, chest tightness, shortness of breath and wheezing.     Cardiovascular: Negative.   Gastrointestinal: Negative for nausea, vomiting, abdominal pain, diarrhea, constipation and abdominal distention.  Genitourinary: Negative for dysuria, urgency, hematuria and flank pain.       No urine drainage from foley Catheter.   Musculoskeletal: Negative for back pain.  Skin: Negative.   Neurological: Negative.   Psychiatric/Behavioral: Negative for confusion and agitation.     There is no immunization history on file for this patient. There are no preventive care reminders to display for this patient. No flowsheet data found. Functional Status Survey:    Filed Vitals:   07/05/15 1309  BP: 114/67  Pulse: 88  Temp: 97.8 F (36.6 C)  Resp: 18  Weight: 174 lb 9.6 oz (79.198 kg)  SpO2: 96%   Body mass index is 23.67 kg/(m^2). Physical Exam  Constitutional:  Thin Elderly in no acute distress.   HENT:  Head: Normocephalic.  Mouth/Throat: Oropharynx is clear and moist.  Eyes: Conjunctivae and EOM are normal. Pupils are equal, round, and reactive to light. Right eye exhibits no discharge. Left eye exhibits no discharge. No scleral icterus.  Neck: Normal range of motion. No JVD present.  Cardiovascular: Normal rate, regular rhythm, normal heart sounds and intact distal pulses.  Exam reveals no gallop and no friction rub.   No murmur heard. Pulmonary/Chest: Effort normal and breath sounds normal.  No respiratory distress. He has no wheezes. He has no rales.  Abdominal: Soft. Bowel sounds are normal. He exhibits no distension and no mass. There is no tenderness. There is no rebound and no guarding.  Genitourinary:  Indwelling Foley Catheter no urine drainage noted.   Musculoskeletal: Normal range of motion. He exhibits no edema or tenderness.  Lymphadenopathy:    He has no cervical adenopathy.  Neurological: He is alert.  Skin: Skin is warm and dry.  Psychiatric: He has a normal mood and affect.    Labs reviewed:  Recent Labs  06/22/15 0613   06/25/15 0932 06/26/15 0622 06/28/15 06/28/15 0730  NA 136  < > 136 139 140 140  K 4.9  < > 4.2 4.4  --  4.0  CL 97*  < > 99* 104  --  107  CO2 24  < > 23 23  --  23  GLUCOSE 87  < > 148* 114*  --  183*  BUN 38*  < > 40* 46* 36* 36*  CREATININE 7.85*  < > 4.81* 4.61* 2.8* 2.80*  CALCIUM 7.7*  < > 8.0* 7.8*  --  7.9*  PHOS 6.3*  --   --  4.9*  --  3.9  < > = values in this interval not displayed.  Recent Labs  06/13/15 1116 06/15/15 0437  06/19/15 1530  06/22/15 0613 06/26/15 0622 06/28/15 0730  AST 40 29  --  38  --   --   --   --   ALT 62 43  --  32  --   --   --   --   ALKPHOS 203* 133*  --  90  --   --   --   --   BILITOT 1.3* 0.9  --  0.3  --   --   --   --   PROT 8.0 6.6  --  6.3*  --   --   --   --   ALBUMIN 2.6* 2.0*  < > 1.5*  < > 1.5* 1.5* 1.4*  < > = values in this interval not displayed.  Recent Labs  06/17/15 1511  06/23/15 0706 06/24/15 0530 06/25/15 0602 06/26/15 06/26/15 0622  WBC 30.9*  < > 18.2* 15.9* 13.8* 13.6 13.6*  NEUTROABS 27.8*  --  15.7*  --   --   --   --   HGB 10.1*  < > 9.1* 8.5* 8.7*  --  8.5*  HCT 29.7*  < > 26.8* 25.8* 27.3*  --  26.5*  MCV 81.6  < > 85.6 84.0 85.6  --  85.2  PLT 307  < > 240 251 284  --  325  < > = values in this interval not displayed. Lab Results  Component Value Date   TSH 0.127* 06/15/2015   No results found for: HGBA1C No results found for: CHOL, HDL, LDLCALC, LDLDIRECT, TRIG, CHOLHDL  Significant Diagnostic Results in last 30 days:  Ct Abdomen Pelvis Wo Contrast  06/19/2015  CLINICAL DATA:  Bilateral hydronephrosis on recent ultrasounds. Renal insufficiency. Abdominal pain. Bloody stools. EXAM: CT ABDOMEN AND PELVIS WITHOUT CONTRAST TECHNIQUE: Multidetector CT imaging of the abdomen and pelvis was performed following the standard protocol without IV contrast. COMPARISON:  Renal ultrasound obtained yesterday FINDINGS: The examination is limited by streak artifact produced by the patient's arms. Lower chest:  Small to moderate-sized left pleural effusion and small right pleural effusion. Bilateral lower lobe atelectasis, greater on the left. Hepatobiliary:  Large number of small gallstones in the gallbladder. The largest measures 5 mm. No gallbladder wall thickening or pericholecystic fluid. Four left lobe liver cysts. There is also a 3.8 x 2.7 cm oval, oval area of low density in the anterior segment of the right lobe of the liver measuring 26 Hounsfield units in density on image 10. Pancreas: No mass or inflammatory process identified on this un-enhanced exam. Spleen: Within normal limits in size. Adrenals/Urinary Tract: Normal appearing right adrenal gland. The left adrenal gland is poorly visualized due to adjacent soft tissue stranding. No gross left adrenal abnormality seen. Again demonstrated is a large upper pole left renal cyst with diffuse, irregular wall thickening and a thin internal septation. This measures 13.2 cm in maximum diameter on image number 25 of series 2. There is surrounding soft tissue stranding. A smaller lower pole left renal cyst is noted. A large lower pole right renal cyst is demonstrated with a more simple appearance, measuring 12.1 cm in maximum diameter. There is a small linear calcification along the wall of this cyst laterally. There are 2 additional smaller right renal cysts. Mild to moderate dilatation of both renal collecting systems. Moderate dilatation of the left ureter to the ureterovesical junction. Mild to moderate dilatation of the right ureter to the level of the ureterovesical junction. No obstructing stones seen at those locations. There are multiple bladder calculi measuring up to 1.2 cm in maximum diameter each. There is also air in the urinary bladder and mild diffuse bladder wall thickening. A Foley catheter is in place. Stomach/Bowel: Multiple colonic diverticula without evidence of diverticulitis. No evidence of appendicitis. No gastric or small bowel abnormalities.  Vascular/Lymphatic: Atheromatous arterial calcifications. No enlarged lymph nodes. Reproductive: Moderately enlarged prostate gland. Other: Small supraumbilical hernia containing fat. Musculoskeletal: Lumbar and lower thoracic spine degenerative changes. Mild right hip degenerative changes. Minimal left hip degenerative changes. IMPRESSION: 1. Limited examination due to the lack of intravenous and oral contrast and due to streak artifacts produced by the patient's arms. 2. 13.2 cm complex left renal cyst with surrounding soft tissue stranding. This could represent an infected cyst. A cystic neoplasm is also possible. 3. Followup 0.1 cm minimally complex right renal cyst. 4. Mild to moderate bilateral hydronephrosis and bilateral hydroureter wrist new levels of the ureterovesical junctions with no obstructing calculus or mass seen. 5. Multiple bladder calculi. 6. Moderately enlarged prostate gland. 7. Mild diffuse bladder wall thickening, compatible with chronic bladder outlet obstruction. 8. Bilateral pleural effusions and bilateral lower lobe atelectasis. 9. Cholelithiasis. 10. 3.8 cm right lobe liver mass. This may represent a complicated cyst or solid mass. 11. Small supraumbilical hernia containing fat. Electronically Signed   By: Beckie Salts M.D.   On: 06/19/2015 13:20   Dg Chest 2 View  06/19/2015  CLINICAL DATA:  Leukocytosis.  Abdominal pain. EXAM: CHEST  2 VIEW COMPARISON:  06/13/2015. FINDINGS: Poor inspiration. No gross change in a normal sized heart. Mildly increased left basilar atelectasis. Interval right basilar atelectasis. Thoracic spine degenerative changes. Interval right jugular catheter with its tip in the inferior right atrium. No pneumothorax. IMPRESSION: 1. Right jugular catheter tip in the inferior right atrium. This could be retracted 6.5 cm to place in the superior vena cava. 2. Mildly increased left basilar atelectasis with interval mild to moderate right basilar atelectasis.  Electronically Signed   By: Beckie Salts M.D.   On: 06/19/2015 13:28   Dg Chest 2 View  06/13/2015  CLINICAL DATA:  Shortness of breath  for 5 days, constipation for 5 days, weakness EXAM: CHEST  2 VIEW COMPARISON:  None FINDINGS: Normal heart size and pulmonary vascularity. Tortuous thoracic aorta. Bronchitic changes with LEFT basilar atelectasis. Remaining lungs clear. No definite pleural effusion or pneumothorax. IMPRESSION: Bronchitic changes with LEFT basilar atelectasis. Electronically Signed   By: Ulyses Southward M.D.   On: 06/13/2015 17:31   Ct Head Wo Contrast  06/20/2015  CLINICAL DATA:  Acute onset of somnolence. Altered mental status. Initial encounter. EXAM: CT HEAD WITHOUT CONTRAST TECHNIQUE: Contiguous axial images were obtained from the base of the skull through the vertex without intravenous contrast. COMPARISON:  None. FINDINGS: There is no evidence of acute infarction, mass lesion, or intra- or extra-axial hemorrhage on CT. Mild periventricular white matter change likely reflects small vessel ischemic microangiopathy. The posterior fossa, including the cerebellum, brainstem and fourth ventricle, is within normal limits. The third and lateral ventricles, and basal ganglia are unremarkable in appearance. The cerebral hemispheres are symmetric in appearance, with normal gray-white differentiation. No mass effect or midline shift is seen. There is no evidence of fracture; visualized osseous structures are unremarkable in appearance. The visualized portions of the orbits are within normal limits. The paranasal sinuses and mastoid air cells are well-aerated. No significant soft tissue abnormalities are seen. IMPRESSION: 1. No acute intracranial pathology seen on CT. 2. Mild small vessel ischemic microangiopathy. Electronically Signed   By: Roanna Raider M.D.   On: 06/20/2015 02:12   US Renal  06/28/2015  CLINICAL DATA:  Followup hydronephrosis. EXAM: RENAL / URINARY TRACT ULTRASOUND COMPLETE  COMPARISON:  06/23/2015 and CT 06/19/2015 FINDINGS: Right Kidney: Length: 13.2 cm. Stable mild hydronephrosis. Stable cysts with the largest measuring 11.8 cm. Left Kidney: Length: 4.9 cm. Stable mild hydronephrosis. Again noted is a large complex cyst with dependent debris as this measures slightly larger with greatest diameter 14.9 cm (previously 12.8 cm). Bladder: Foley catheter is present within a decompressed bladder. Several echogenic foci unchanged likely known bladder stones versus air. IMPRESSION: Normal size kidneys with persistent mild bilateral hydronephrosis. Slight increase in size of known complex left renal cyst measuring 14.9 cm (previously 12.8 cm). Stable large right renal cyst measuring 11.8 cm. Foley catheter within a decompressed bladder. Echogenic foci within the bladder compatible with known stones versus air. Electronically Signed   By: Elberta Fortis M.D.   On: 06/28/2015 12:05   US Renal  06/23/2015  CLINICAL DATA:  82 year old male with acute kidney injury and urinary retention. Followup hydronephrosis. EXAM: RENAL / URINARY TRACT ULTRASOUND COMPLETE COMPARISON:  06/18/2015 ultrasound. FINDINGS: Right Kidney: Length: 18.5 cm. Increased renal echogenicity noted. A 12 cm simple appearing right renal cyst is again identified. Mild -moderate right hydronephrosis does not appear significantly changed. Left Kidney: Length: 13.3 cm. A 13 cm complex left renal cyst is again identified. Echogenicity is mildly increased. Mild -moderate left hydronephrosis does not appear significantly changed. Bladder: Debris and calculi within the bladder again noted. IMPRESSION: No significant change in mild -moderate bilateral hydronephrosis. Unchanged 13 cm complex left renal cyst. Debris and calculi within the bladder again noted. Electronically Signed   By: Harmon Pier M.D.   On: 06/23/2015 15:45   US Renal  06/18/2015  CLINICAL DATA:  Acute renal failure. Follow-up bilateral hydronephrosis. EXAM: RENAL  / URINARY TRACT ULTRASOUND COMPLETE COMPARISON:  06/13/2015. FINDINGS: Right Kidney: Length: 12.6 cm. Normal echogenicity. Stable dilatation of the collecting system and previously described cysts. Left Kidney: Length: 12.7 cm. Normal echogenicity. Stable dilatation of the  collecting system. Stable large complex cyst. Bladder: Interval Foley catheter.  Stable debris and multiple calculi. IMPRESSION: 1. Stable mild bilateral hydronephrosis. 2. Stable large, complex left renal cyst concerning for the possibility of a cystic neoplasm. 3. Stable debris and calculi in the bladder. Electronically Signed   By: Beckie Salts M.D.   On: 06/18/2015 14:51   US Renal  06/13/2015  CLINICAL DATA:  Renal failure EXAM: RENAL / URINARY TRACT ULTRASOUND COMPLETE COMPARISON:  None. FINDINGS: Right Kidney: Length: 12.6 cm. Normal echogenicity. Mild hydronephrosis. There is a 12 cm renal cyst and a 3 cm renal cyst, both of which appear relatively simple. Left Kidney: Length: 12.7 cm. Mild hydronephrosis. Normal echogenicity. 14 cm cysts with numerous internal septations and wall thickening. Bladder: Numerous bladder calculi identified. There is debris layering within the bladder and the largest calculus measures about 13 mm. Prostate is enlarged at 5.4 cm. Incidentally detected is a wall echo shadow complex involving the gallbladder indicating cholelithiasis. IMPRESSION: 1. Extensive cholelithiasis 2. Enlarged prostate 3. Bladder calculi 4. Large bilateral renal cysts. On the left, a 14 cm cyst shows a complex appearance and malignancy is not excluded. Further evaluation with renal protocol CT scan or MRI suggested. 5. Mild bilateral hydronephrosis Electronically Signed   By: Esperanza Heir M.D.   On: 06/13/2015 18:48   Ir Fluoro Guide Cv Line Right  06/16/2015  INDICATION: Renal failure and needs hemodialysis. Plan for placement of non tunneled catheter due to leukocytosis and possible urinary tract infection. EXAM: FLUOROSCOPIC  AND ULTRASOUND GUIDED PLACEMENT OF A NON TUNNELED DIALYSIS CATHETER Physician: Rachelle Hora. Henn, MD MEDICATIONS: None ANESTHESIA/SEDATION: None FLUOROSCOPY TIME:  Fluoroscopy Time: 24 seconds, 1.3 mGy COMPLICATIONS: None immediate. PROCEDURE: Informed consent was obtained for placement of a tunneled dialysis catheter. The patient was placed supine on the interventional table. Ultrasound confirmed a patent right internal jugular vein. Ultrasound images were obtained for documentation. The right side of the neck was prepped and draped in a sterile fashion. The right side of the neck was anesthetized with 1% lidocaine. Maximal barrier sterile technique was utilized including caps, mask, sterile gowns, sterile gloves, sterile drape, hand hygiene and skin antiseptic. A small incision was made with #11 blade scalpel. A 21 gauge needle directed into the right internal jugular vein with ultrasound guidance. A micropuncture dilator set was placed. J wire was advanced into the central veins. A 20 cm Trialysis catheter was advanced over the wire and positioned at the superior cavoatrial junction. Both dialysis lumens were found to aspirate and flush well. The proper amount of heparin was placed in both lumens. The central lumen aspirated and flushed well. Central lumen was flushed with normal saline. The catheter was secured to the skin using Prolene suture. Fluoroscopic and ultrasound images were taken and saved for documentation. FINDINGS: Catheter tip at the superior cavoatrial junction. IMPRESSION: Successful placement of a non tunneled dialysis catheter using ultrasound and fluoroscopic guidance. Electronically Signed   By: Richarda Overlie M.D.   On: 06/16/2015 12:32   Ir US Guide Vasc Access Right  06/16/2015  INDICATION: Renal failure and needs hemodialysis. Plan for placement of non tunneled catheter due to leukocytosis and possible urinary tract infection. EXAM: FLUOROSCOPIC AND ULTRASOUND GUIDED PLACEMENT OF A NON TUNNELED  DIALYSIS CATHETER Physician: Rachelle Hora. Henn, MD MEDICATIONS: None ANESTHESIA/SEDATION: None FLUOROSCOPY TIME:  Fluoroscopy Time: 24 seconds, 1.3 mGy COMPLICATIONS: None immediate. PROCEDURE: Informed consent was obtained for placement of a tunneled dialysis catheter. The patient was placed supine  on the interventional table. Ultrasound confirmed a patent right internal jugular vein. Ultrasound images were obtained for documentation. The right side of the neck was prepped and draped in a sterile fashion. The right side of the neck was anesthetized with 1% lidocaine. Maximal barrier sterile technique was utilized including caps, mask, sterile gowns, sterile gloves, sterile drape, hand hygiene and skin antiseptic. A small incision was made with #11 blade scalpel. A 21 gauge needle directed into the right internal jugular vein with ultrasound guidance. A micropuncture dilator set was placed. J wire was advanced into the central veins. A 20 cm Trialysis catheter was advanced over the wire and positioned at the superior cavoatrial junction. Both dialysis lumens were found to aspirate and flush well. The proper amount of heparin was placed in both lumens. The central lumen aspirated and flushed well. Central lumen was flushed with normal saline. The catheter was secured to the skin using Prolene suture. Fluoroscopic and ultrasound images were taken and saved for documentation. FINDINGS: Catheter tip at the superior cavoatrial junction. IMPRESSION: Successful placement of a non tunneled dialysis catheter using ultrasound and fluoroscopic guidance. Electronically Signed   By: Richarda Overlie M.D.   On: 06/16/2015 12:32   Dg Chest Port 1 View  06/26/2015  CLINICAL DATA:  Fever last night EXAM: PORTABLE CHEST 1 VIEW COMPARISON:  06/19/2015 FINDINGS: Consolidation in the left lower lobe concerning for pneumonia. Right basilar atelectasis or infiltrate. Heart is normal size. No effusions. No real change since prior study.  IMPRESSION: Bibasilar opacities, left greater than right, concerning for pneumonia on the left. No real change. Electronically Signed   By: Charlett Nose M.D.   On: 06/26/2015 08:23   Dg Abd 2 Views  06/13/2015  CLINICAL DATA:  Shortness of breath for 5 days, constipation for 5 days, weakness EXAM: ABDOMEN - 2 VIEW COMPARISON:  None FINDINGS: Bronchitic changes with subsegmental atelectasis at LEFT base. Normal bowel gas pattern. No bowel dilatation, bowel wall thickening or free intraperitoneal air. Probable nipple shadows on upright view. Numerous pelvic phleboliths. No acute osseous findings or urinary tract calcification. IMPRESSION: No acute abdominal findings. Electronically Signed   By: Ulyses Southward M.D.   On: 06/13/2015 17:32    Assessment/Plan 1. Iron deficiency anemia Recent Hgb 7.3 (07/04/2015) previous 8.5 (06/26/2015). Will initiate Ferrous sulfate 325 mg Tablet once daily. Monitor CBC.   2. Leukocytosis Afebrile. WBC 11.2 ( 07/04/2015) trending down from previous 13.6 ( 06/26/2015). Ordering urine specimen for U/A and C/S.   3. Urinary retention Has indwelling Foley Catheter reports no drainage. Facility reports output 350cc with the morning shift. Foley inserted 06/13/2015.No abdominal distention, Abdominal pain or discomfort. Though PT reports patient refusing Therapy due to possible bladder spasm/Pain.schedule for TURP surgery with [facility] with DR. Patsi Sears 07/15/2015.Facility Nurse to remove foley Catheter and inserted new foley Catheter. Obtain U/A and C/S. Encourage Fluid intake.    Family/ staff Communication: Reviewed plan of Care with Patient and Facility Nurse Supervisor.   Labs/tests ordered:  Urine specimen for U/A and C/S

## 2015-07-11 ENCOUNTER — Encounter (HOSPITAL_COMMUNITY): Payer: Self-pay | Admitting: *Deleted

## 2015-07-11 NOTE — Progress Notes (Signed)
Faxed urinalysis 07/05/2015, CMP and CBCD 07/01/2015 to Pam/scheduler with Dr Patsi Searsannenbaum

## 2015-07-11 NOTE — Progress Notes (Addendum)
Preop instructions for:  Jeff Parks           Date of Birth     22-Sep-1929                 Date of Procedure:  Friday July 15, 2015     Doctor:   Jeff Patsi Searsannenbaum  Time to arrive at [facility]: 8am  Report to: Admitting Any procedure time changes, MD office will notify you!   Do not eat or drink past midnight the night before your procedure.(To include any tube feedings-must be discontinued)   Take these morning medications only with sips of water.(or give through gastrostomy or feeding tube). NONE  FOLLOW [facility]-PREPARING FOR SURGERY INSTRUCTIONS   Facility contact:  Jeff SemenAshton Parks             Phone:  [phone]           Health Care POA: NONE  Transportation contact phone#: Jeff RichmondHazel Parks (wife) (home) [phone] or (cell) [phone]   Please send day of procedure:current med list and meds last taken that day, confirm nothing by mouth status from what time, Patient Demographic info( to include DNR status, problem list, allergies)   RN contact name/phone#:   Jeff BostonStephanie Thomas LPN [phone] [facility]       and Fax #:[phone]  Bring Insurance card and picture ID Leave all jewelry and other valuables at Parks where living( no metal or rings to be worn) No contact lens Men-no colognes,lotions  Any questions day of procedure,call [facility] [phone]   Sent from :Jeff Parks                   Phone:[phone]                   Fax:[phone]  Sent by :RN:__Tonya Marcial Pacasunyan RN BSN __

## 2015-07-11 NOTE — Progress Notes (Addendum)
Spoke with pts wife Jeff RichmondHazel Parks. Pts wife aware of surgical date and time. [facility]. Pts wife is transporting pt to hospital and states pt is alert and oriented and can sign own paperwork. No POA.

## 2015-07-14 ENCOUNTER — Encounter: Payer: Self-pay | Admitting: Family

## 2015-07-14 ENCOUNTER — Non-Acute Institutional Stay (SKILLED_NURSING_FACILITY): Payer: Self-pay | Admitting: Family

## 2015-07-14 DIAGNOSIS — D638 Anemia in other chronic diseases classified elsewhere: Secondary | ICD-10-CM

## 2015-07-14 DIAGNOSIS — N1330 Unspecified hydronephrosis: Secondary | ICD-10-CM

## 2015-07-14 DIAGNOSIS — E440 Moderate protein-calorie malnutrition: Secondary | ICD-10-CM

## 2015-07-14 DIAGNOSIS — R339 Retention of urine, unspecified: Secondary | ICD-10-CM

## 2015-07-14 NOTE — H&P (Signed)
Reason For Visit Hospital f/u & discuss surgery   Active Problems Problems  1. BPH (benign prostatic hypertrophy) with urinary retention (N40.1,R33.8)   Assessed By: Jethro Bolusannenbaum, Fionnuala Hemmerich (Urology); Last Assessed: 13 Jul 2015  History of Present Illness     82 yo male currently residing at [facility] ( skilled nursing), presents today for f/u after recent hospitalization from 2/13 - 06/28/15 for hx of urinary retention & constipation. He had a Renal u/s that showed bilateral hydronephrosis, debris/multiple bladder stones within the bladder, bilateral renal cysts, and a 14mm Lt renal complex cyst. Foley was inserted & drained 2.15 liters of urine. He had an elevated Creatinine of 14.2 and GFR of 3. Creatinine on 06/22/15 was 7.85, He is scheduled for cystolithopexy, Holmium laser/bilateral RPG/bilateral JJ stents/TURP on 07/15/15.   Past Medical History Problems  1. History of No significant past medical history  Surgical History Problems  1. History of No Surgical Problems  Current Meds 1. Aleve TABS;  Therapy: (Recorded:15Mar2017) to Recorded 2. Tylenol CAPS;  Therapy: (Recorded:15Mar2017) to Recorded  Allergies Medication  1. No Known Drug Allergies  Family History Problems  1. Family history of Deceased : Mother, Father 2. Family history of renal failure (Z84.1) : Sister, Brother  Social History Problems  1. Former smoker [phone](Z87.891) 2. Married 3. No alcohol use 4. No caffeine use 5. Number of children   1 son, 1 daughter 176. Occupation   Media plannerTaxi driver  Review of Systems Pt having normal BM q day. No thinning of stool, and no blood in BM.Never black or tarry bowel movements..  Genitourinary, constitutional, skin, eye, otolaryngeal, hematologic/lymphatic, cardiovascular, pulmonary, endocrine, musculoskeletal, gastrointestinal, neurological and psychiatric system(s) were reviewed and pertinent findings if present are noted and are otherwise negative.  Genitourinary:  nocturia and incomplete emptying of bladder.  Constitutional: feeling tired (fatigue) and recent weight loss.    Vitals Vital Signs [Data Includes: Last 1 Day]  Recorded: 15Mar2017 04:26PM  Height: 6 ft  Weight: 175 lb  BMI Calculated: 23.73 BSA Calculated: 2.01 Blood Pressure: 103 / 64 Temperature: 98 F Heart Rate: 94  Physical Exam Constitutional: Well nourished and well developed . No acute distress. The patient appears well hydrated. Wheelchair-bound 2ndary weakness. Now awake and alert.  ENT:. The ears and nose are normal in appearance. Examination of the teeth show poor dentition.  Neck:. There is no jugular-venous distention.  Pulmonary: No respiratory distress.  Cardiovascular:. No peripheral edema.  Abdomen: The abdomen is flat. The abdomen is soft and nontender. Bowel sounds are normal.  Rectal: Rectal exam demonstrates rectal tenderness, but absent sphincter tone. Estimated prostate size is 4+. Poor cooperation. very difficult to palpate prostate. Rectal mucosa difficult to manipulate. The prostate has no nodularity.  Genitourinary: Examination of the penis demonstrates no discharge, no masses, no lesions and a normal meatus. The penis is uncircumcised. The scrotum is without lesions. The right epididymis is palpably normal and non-tender. The left epididymis is palpably normal and non-tender. The right testis is non-tender and without masses. The left testis is non-tender and without masses.  Skin: Normal skin turgor, no visible rash and no visible skin lesions.  Neuro/Psych:. Mood and affect are appropriate.    Assessment Assessed  1. BPH (benign prostatic hypertrophy) with urinary retention (N40.1,R33.8) 2. 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 Urinary retention (R33.9)  Jeff Parks is seen today with his wife in preparation for surgery for chronic urinary retention. He has a chronic Foley catheter, and had 2.5L urinary retention with Creatinine of 14.2  and gfr=3 on admission. He  has undergone acute dialysis., and renal ultrasound shows good renal filter bilaterally.  He has bladder stones, and was initially thought to have hydronephrosis, but, upon further review, is thought to have bilateral renal pelvic cysts.    We have discussed his upcoming surgery, and I have discussed the case with Nephrology, who is in favor of giving him a chance at normal voiding pattern. He will have TURP, and cystolitholopaxy, and s-p tube ( as safety tube); and bilateral retrograde pyelograms, and possible bilateral JJ stents-which would need to be changed in up to 6 months-or removed in office prior to that. .   I am concerned that his rectal exam was so difficult to perform- he will have rectal ecam in OR try to get a better rectal exam.   Plan Cystoscopy, TURP, s-p tube placement, bilateral retrograde pyelograms, possible JJ stents.   Signatures Electronically signed by : Jethro Bolus, M.D.; Jul 13 2015  5:11PM EST

## 2015-07-14 NOTE — Progress Notes (Signed)
Location:  [facility] Room Number: [phone] Place of Service:  SNF (31)  Provider:Ngetich, Dinah FNP-C Oneal Grout, MD  PCP: No primary care provider on file. No care team member to display  Extended Emergency Contact Information Primary Emergency Contact: Kucinski,Hazel Address: [address]          [place] [address] [place] Home Phone: [phone] Mobile Phone: [phone] Relation: Spouse  Code Status: Full Code Goals of care:  Advanced Directive information Advanced Directives 07/14/2015  Does patient have an advance directive? No  Would patient like information on creating an advanced directive? No - patient declined information     No Known Allergies  Chief Complaint  Patient presents with  . Discharge Note    HPI:  82 y.o. male  Seen today at [facility] for discharge to [facility] for Cystolithopaxy Bilateral Retrograde pyelogram bilateral JJ stent, Transurethral resection of prostate surgery 07/15/2015 at 11:00 am with Dr. Patsi [facility]. He has a medical history of bladder outlet obstruction with hydronephrosis, encephalopathy, liver mass, Malnutrition among others. He is seen today in his room sitting up on wheelchair. He denies any acute issues. He states foley catheter has been draining. Facility staff reports no new concerns. He is aware to be NPO after midnight tonight for TURP surgery 07/15/2015. He will require a standard wheelchair with leg rest, back and seat cushion, anti tippers.Facility social will arrange DME prior to discharge.     Past Medical History  Diagnosis Date  . Constipation   . Anemia     iron deficiency   . Leukocytosis   . Urinary retention   . Bladder outlet obstruction   . Foley catheter in place   . Acute kidney injury (HCC)   . Uremic acidosis   . Bilateral hydronephrosis   . Urinary tract infection   . Dialysis patient (HCC)     hx of    . Latent syphilis   . Constipation   . Hyperthyroidism   . Acute encephalopathy   . Protein calorie malnutrition (HCC)   . Pneumonia     hospital acquired   . Muscle weakness (generalized)   . Difficulty walking     History reviewed. No pertinent past surgical history.    reports that he has never smoked. He has never used smokeless tobacco. He reports that he does not drink alcohol or use illicit drugs. Social History   Social History  . Marital Status: Married    Spouse Name: N/A  . Number of Children: N/A  . Years of Education: N/A   Occupational History  . Not on file.   Social History Main Topics  . Smoking status: Never Smoker   . Smokeless tobacco: Never Used  . Alcohol Use: No  . Drug Use: No  . Sexual Activity: No   Other Topics Concern  . Not on file   Social History Narrative   Functional Status Survey:    No Known Allergies  Pertinent  Health Maintenance Due  Topic Date Due  . PNA vac Low Risk Adult (1 of 2 - PCV13) 07/12/1996  . INFLUENZA VACCINE  11/29/2014    Medications:   Medication List       This list is accurate as of: 07/14/15 12:03 PM.  Always use your most recent med list.               acetaminophen 325 MG tablet  Commonly known as:  TYLENOL  Take 650 mg by mouth every 4 (four) hours as needed (Pain).     feeding supplement (NEPRO CARB STEADY) Liqd  Take 237 mLs by mouth 2 (two) times daily between meals.     MULTIVITAMIN ADULT PO  Take 1 tablet by mouth daily.        Review of Systems  Constitutional: Negative for fever, chills, activity change, appetite change and fatigue.  HENT: Negative for congestion, sinus pressure, sneezing and sore throat.   Eyes: Negative.   Respiratory: Negative for cough, chest tightness, shortness of breath and wheezing.   Cardiovascular: Negative for chest pain, palpitations and leg swelling.  Gastrointestinal: Positive for nausea. Negative for vomiting, abdominal pain, diarrhea,  constipation and abdominal distention.  Endocrine: Negative.   Genitourinary: Negative for flank pain.       Foley Catheter   Musculoskeletal: Positive for gait problem.  Skin: Negative.   Allergic/Immunologic: Negative.   Neurological: Negative.   Hematological: Negative.   Psychiatric/Behavioral: Negative.     Filed Vitals:   07/14/15 1150  BP: 109/70  Pulse: 92  Temp: 97.6 F (36.4 C)  TempSrc: Oral  Resp: 18  Height: 6' (1.829 m)  Weight: 164 lb 9.6 oz (74.662 kg)  SpO2: 96%   Body mass index is 22.32 kg/(m^2). Physical Exam  Constitutional: He is oriented to person, place, and time.  Frail Elderly in no acute distress   HENT:  Head: Normocephalic.  Mouth/Throat: Oropharynx is clear and moist.  Eyes: Conjunctivae and EOM are normal. Pupils are equal, round, and reactive to light. Right eye exhibits no discharge. Left eye exhibits no discharge. No scleral icterus.  Neck: Normal range of motion. No JVD present. No thyromegaly present.  Cardiovascular: Normal rate, regular rhythm, normal heart sounds and intact distal pulses.  Exam reveals no gallop and no friction rub.   No murmur heard. Pulmonary/Chest: Effort normal and breath sounds normal. No respiratory distress. He has no wheezes. He has no rales.  Abdominal: Soft. Bowel sounds are normal. He exhibits no distension and no mass. There is no tenderness. There is no rebound and no guarding.  Genitourinary:  Foley Catheter draining clear yellow urine. Small amounts noted in foley bag.   Musculoskeletal: Normal range of motion. He exhibits no edema or tenderness.  Lymphadenopathy:    He has no cervical adenopathy.  Neurological: He is oriented to person, place, and time.  Skin: Skin is warm and dry. No rash noted. No erythema. No pallor.  Psychiatric: He has a normal mood and affect.    Labs reviewed: Basic Metabolic Panel:  Recent Labs  16/10/96 0613  06/25/15 0932 06/26/15 0622 06/28/15 06/28/15 0730  NA  136  < > 136 139 140 140  K 4.9  < > 4.2 4.4  --  4.0  CL 97*  < > 99* 104  --  107  CO2 24  < > 23 23  --  23  GLUCOSE 87  < > 148* 114*  --  183*  BUN 38*  < > 40* 46* 36* 36*  CREATININE 7.85*  < > 4.81* 4.61* 2.8* 2.80*  CALCIUM 7.7*  < > 8.0* 7.8*  --  7.9*  PHOS 6.3*  --   --  4.9*  --  3.9  < > = values in this interval not displayed. Liver Function Tests:  Recent Labs  06/13/15 1116 06/15/15 0437  06/19/15 1530  06/22/15 0613 06/26/15 0622 06/28/15 0730  AST  40 29  --  38  --   --   --   --   ALT 62 43  --  32  --   --   --   --   ALKPHOS 203* 133*  --  90  --   --   --   --   BILITOT 1.3* 0.9  --  0.3  --   --   --   --   PROT 8.0 6.6  --  6.3*  --   --   --   --   ALBUMIN 2.6* 2.0*  < > 1.5*  < > 1.5* 1.5* 1.4*  < > = values in this interval not displayed.  Recent Labs  06/13/15 1116 06/15/15 1152  LIPASE 54* 49    Recent Labs  06/19/15 1530  AMMONIA 45*   CBC:  Recent Labs  06/17/15 1511  06/23/15 0706 06/24/15 0530 06/25/15 0602 06/26/15 06/26/15 0622  WBC 30.9*  < > 18.2* 15.9* 13.8* 13.6 13.6*  NEUTROABS 27.8*  --  15.7*  --   --   --   --   HGB 10.1*  < > 9.1* 8.5* 8.7*  --  8.5*  HCT 29.7*  < > 26.8* 25.8* 27.3*  --  26.5*  MCV 81.6  < > 85.6 84.0 85.6  --  85.2  PLT 307  < > 240 251 284  --  325  < > = values in this interval not displayed. Cardiac Enzymes: No results for input(s): CKTOTAL, CKMB, CKMBINDEX, TROPONINI in the last 8760 hours. BNP: Invalid input(s): POCBNP CBG: No results for input(s): GLUCAP in the last 8760 hours.  Procedures and Imaging Studies During Stay: Ct Abdomen Pelvis Wo Contrast  06/19/2015  CLINICAL DATA:  Bilateral hydronephrosis on recent ultrasounds. Renal insufficiency. Abdominal pain. Bloody stools. EXAM: CT ABDOMEN AND PELVIS WITHOUT CONTRAST TECHNIQUE: Multidetector CT imaging of the abdomen and pelvis was performed following the standard protocol without IV contrast. COMPARISON:  Renal ultrasound  obtained yesterday FINDINGS: The examination is limited by streak artifact produced by the patient's arms. Lower chest: Small to moderate-sized left pleural effusion and small right pleural effusion. Bilateral lower lobe atelectasis, greater on the left. Hepatobiliary: Large number of small gallstones in the gallbladder. The largest measures 5 mm. No gallbladder wall thickening or pericholecystic fluid. Four left lobe liver cysts. There is also a 3.8 x 2.7 cm oval, oval area of low density in the anterior segment of the right lobe of the liver measuring 26 Hounsfield units in density on image 10. Pancreas: No mass or inflammatory process identified on this un-enhanced exam. Spleen: Within normal limits in size. Adrenals/Urinary Tract: Normal appearing right adrenal gland. The left adrenal gland is poorly visualized due to adjacent soft tissue stranding. No gross left adrenal abnormality seen. Again demonstrated is a large upper pole left renal cyst with diffuse, irregular wall thickening and a thin internal septation. This measures 13.2 cm in maximum diameter on image number 25 of series 2. There is surrounding soft tissue stranding. A smaller lower pole left renal cyst is noted. A large lower pole right renal cyst is demonstrated with a more simple appearance, measuring 12.1 cm in maximum diameter. There is a small linear calcification along the wall of this cyst laterally. There are 2 additional smaller right renal cysts. Mild to moderate dilatation of both renal collecting systems. Moderate dilatation of the left ureter to the ureterovesical junction. Mild to moderate dilatation of the right  ureter to the level of the ureterovesical junction. No obstructing stones seen at those locations. There are multiple bladder calculi measuring up to 1.2 cm in maximum diameter each. There is also air in the urinary bladder and mild diffuse bladder wall thickening. A Foley catheter is in place. Stomach/Bowel: Multiple colonic  diverticula without evidence of diverticulitis. No evidence of appendicitis. No gastric or small bowel abnormalities. Vascular/Lymphatic: Atheromatous arterial calcifications. No enlarged lymph nodes. Reproductive: Moderately enlarged prostate gland. Other: Small supraumbilical hernia containing fat. Musculoskeletal: Lumbar and lower thoracic spine degenerative changes. Mild right hip degenerative changes. Minimal left hip degenerative changes. IMPRESSION: 1. Limited examination due to the lack of intravenous and oral contrast and due to streak artifacts produced by the patient's arms. 2. 13.2 cm complex left renal cyst with surrounding soft tissue stranding. This could represent an infected cyst. A cystic neoplasm is also possible. 3. Followup 0.1 cm minimally complex right renal cyst. 4. Mild to moderate bilateral hydronephrosis and bilateral hydroureter wrist new levels of the ureterovesical junctions with no obstructing calculus or mass seen. 5. Multiple bladder calculi. 6. Moderately enlarged prostate gland. 7. Mild diffuse bladder wall thickening, compatible with chronic bladder outlet obstruction. 8. Bilateral pleural effusions and bilateral lower lobe atelectasis. 9. Cholelithiasis. 10. 3.8 cm right lobe liver mass. This may represent a complicated cyst or solid mass. 11. Small supraumbilical hernia containing fat. Electronically Signed   By: Beckie SaltsSteven  Reid M.D.   On: 06/19/2015 13:20   Dg Chest 2 View  06/19/2015  CLINICAL DATA:  Leukocytosis.  Abdominal pain. EXAM: CHEST  2 VIEW COMPARISON:  06/13/2015. FINDINGS: Poor inspiration. No gross change in a normal sized heart. Mildly increased left basilar atelectasis. Interval right basilar atelectasis. Thoracic spine degenerative changes. Interval right jugular catheter with its tip in the inferior right atrium. No pneumothorax. IMPRESSION: 1. Right jugular catheter tip in the inferior right atrium. This could be retracted 6.5 cm to place in the superior vena  cava. 2. Mildly increased left basilar atelectasis with interval mild to moderate right basilar atelectasis. Electronically Signed   By: Beckie SaltsSteven  Reid M.D.   On: 06/19/2015 13:28   Ct Head Wo Contrast  06/20/2015  CLINICAL DATA:  Acute onset of somnolence. Altered mental status. Initial encounter. EXAM: CT HEAD WITHOUT CONTRAST TECHNIQUE: Contiguous axial images were obtained from the base of the skull through the vertex without intravenous contrast. COMPARISON:  None. FINDINGS: There is no evidence of acute infarction, mass lesion, or intra- or extra-axial hemorrhage on CT. Mild periventricular white matter change likely reflects small vessel ischemic microangiopathy. The posterior fossa, including the cerebellum, brainstem and fourth ventricle, is within normal limits. The third and lateral ventricles, and basal ganglia are unremarkable in appearance. The cerebral hemispheres are symmetric in appearance, with normal gray-white differentiation. No mass effect or midline shift is seen. There is no evidence of fracture; visualized osseous structures are unremarkable in appearance. The visualized portions of the orbits are within normal limits. The paranasal sinuses and mastoid air cells are well-aerated. No significant soft tissue abnormalities are seen. IMPRESSION: 1. No acute intracranial pathology seen on CT. 2. Mild small vessel ischemic microangiopathy. Electronically Signed   By: Roanna RaiderJeffery  Chang M.D.   On: 06/20/2015 02:12   [place] Renal  06/28/2015  CLINICAL DATA:  Followup hydronephrosis. EXAM: RENAL / URINARY TRACT ULTRASOUND COMPLETE COMPARISON:  06/23/2015 and CT 06/19/2015 FINDINGS: Right Kidney: Length: 13.2 cm. Stable mild hydronephrosis. Stable cysts with the largest measuring 11.8 cm. Left Kidney: Length:  4.9 cm. Stable mild hydronephrosis. Again noted is a large complex cyst with dependent debris as this measures slightly larger with greatest diameter 14.9 cm (previously 12.8 cm). Bladder: Foley  catheter is present within a decompressed bladder. Several echogenic foci unchanged likely known bladder stones versus air. IMPRESSION: Normal size kidneys with persistent mild bilateral hydronephrosis. Slight increase in size of known complex left renal cyst measuring 14.9 cm (previously 12.8 cm). Stable large right renal cyst measuring 11.8 cm. Foley catheter within a decompressed bladder. Echogenic foci within the bladder compatible with known stones versus air. Electronically Signed   By: Elberta Fortis M.D.   On: 06/28/2015 12:05   US Renal  06/23/2015  CLINICAL DATA:  82 year old male with acute kidney injury and urinary retention. Followup hydronephrosis. EXAM: RENAL / URINARY TRACT ULTRASOUND COMPLETE COMPARISON:  06/18/2015 ultrasound. FINDINGS: Right Kidney: Length: 18.5 cm. Increased renal echogenicity noted. A 12 cm simple appearing right renal cyst is again identified. Mild -moderate right hydronephrosis does not appear significantly changed. Left Kidney: Length: 13.3 cm. A 13 cm complex left renal cyst is again identified. Echogenicity is mildly increased. Mild -moderate left hydronephrosis does not appear significantly changed. Bladder: Debris and calculi within the bladder again noted. IMPRESSION: No significant change in mild -moderate bilateral hydronephrosis. Unchanged 13 cm complex left renal cyst. Debris and calculi within the bladder again noted. Electronically Signed   By: Harmon Pier M.D.   On: 06/23/2015 15:45   US Renal  06/18/2015  CLINICAL DATA:  Acute renal failure. Follow-up bilateral hydronephrosis. EXAM: RENAL / URINARY TRACT ULTRASOUND COMPLETE COMPARISON:  06/13/2015. FINDINGS: Right Kidney: Length: 12.6 cm. Normal echogenicity. Stable dilatation of the collecting system and previously described cysts. Left Kidney: Length: 12.7 cm. Normal echogenicity. Stable dilatation of the collecting system. Stable large complex cyst. Bladder: Interval Foley catheter.  Stable debris and  multiple calculi. IMPRESSION: 1. Stable mild bilateral hydronephrosis. 2. Stable large, complex left renal cyst concerning for the possibility of a cystic neoplasm. 3. Stable debris and calculi in the bladder. Electronically Signed   By: Beckie Salts M.D.   On: 06/18/2015 14:51   Ir Fluoro Guide Cv Line Right  06/16/2015  INDICATION: Renal failure and needs hemodialysis. Plan for placement of non tunneled catheter due to leukocytosis and possible urinary tract infection. EXAM: FLUOROSCOPIC AND ULTRASOUND GUIDED PLACEMENT OF A NON TUNNELED DIALYSIS CATHETER Physician: Rachelle Hora. Henn, MD MEDICATIONS: None ANESTHESIA/SEDATION: None FLUOROSCOPY TIME:  Fluoroscopy Time: 24 seconds, 1.3 mGy COMPLICATIONS: None immediate. PROCEDURE: Informed consent was obtained for placement of a tunneled dialysis catheter. The patient was placed supine on the interventional table. Ultrasound confirmed a patent right internal jugular vein. Ultrasound images were obtained for documentation. The right side of the neck was prepped and draped in a sterile fashion. The right side of the neck was anesthetized with 1% lidocaine. Maximal barrier sterile technique was utilized including caps, mask, sterile gowns, sterile gloves, sterile drape, hand hygiene and skin antiseptic. A small incision was made with #11 blade scalpel. A 21 gauge needle directed into the right internal jugular vein with ultrasound guidance. A micropuncture dilator set was placed. J wire was advanced into the central veins. A 20 cm Trialysis catheter was advanced over the wire and positioned at the superior cavoatrial junction. Both dialysis lumens were found to aspirate and flush well. The proper amount of heparin was placed in both lumens. The central lumen aspirated and flushed well. Central lumen was flushed with normal saline. The catheter was  secured to the skin using Prolene suture. Fluoroscopic and ultrasound images were taken and saved for documentation. FINDINGS:  Catheter tip at the superior cavoatrial junction. IMPRESSION: Successful placement of a non tunneled dialysis catheter using ultrasound and fluoroscopic guidance. Electronically Signed   By: Richarda Overlie M.D.   On: 06/16/2015 12:32   Ir US Guide Vasc Access Right  06/16/2015  INDICATION: Renal failure and needs hemodialysis. Plan for placement of non tunneled catheter due to leukocytosis and possible urinary tract infection. EXAM: FLUOROSCOPIC AND ULTRASOUND GUIDED PLACEMENT OF A NON TUNNELED DIALYSIS CATHETER Physician: Rachelle Hora. Henn, MD MEDICATIONS: None ANESTHESIA/SEDATION: None FLUOROSCOPY TIME:  Fluoroscopy Time: 24 seconds, 1.3 mGy COMPLICATIONS: None immediate. PROCEDURE: Informed consent was obtained for placement of a tunneled dialysis catheter. The patient was placed supine on the interventional table. Ultrasound confirmed a patent right internal jugular vein. Ultrasound images were obtained for documentation. The right side of the neck was prepped and draped in a sterile fashion. The right side of the neck was anesthetized with 1% lidocaine. Maximal barrier sterile technique was utilized including caps, mask, sterile gowns, sterile gloves, sterile drape, hand hygiene and skin antiseptic. A small incision was made with #11 blade scalpel. A 21 gauge needle directed into the right internal jugular vein with ultrasound guidance. A micropuncture dilator set was placed. J wire was advanced into the central veins. A 20 cm Trialysis catheter was advanced over the wire and positioned at the superior cavoatrial junction. Both dialysis lumens were found to aspirate and flush well. The proper amount of heparin was placed in both lumens. The central lumen aspirated and flushed well. Central lumen was flushed with normal saline. The catheter was secured to the skin using Prolene suture. Fluoroscopic and ultrasound images were taken and saved for documentation. FINDINGS: Catheter tip at the superior cavoatrial junction.  IMPRESSION: Successful placement of a non tunneled dialysis catheter using ultrasound and fluoroscopic guidance. Electronically Signed   By: Richarda Overlie M.D.   On: 06/16/2015 12:32   Dg Chest Port 1 View  06/26/2015  CLINICAL DATA:  Fever last night EXAM: PORTABLE CHEST 1 VIEW COMPARISON:  06/19/2015 FINDINGS: Consolidation in the left lower lobe concerning for pneumonia. Right basilar atelectasis or infiltrate. Heart is normal size. No effusions. No real change since prior study. IMPRESSION: Bibasilar opacities, left greater than right, concerning for pneumonia on the left. No real change. Electronically Signed   By: Charlett Nose M.D.   On: 06/26/2015 08:23    Assessment/Plan:    Protein Calorie Malnutrition  Has improved.Continue on Protein supplement.   Hydronephrosis Bilateral discharged to [facility] for Cystolithopaxy Bilateral Retrograde pyelogram bilateral JJ stent, Transurethral resection of prostate surgery 07/15/2015 at 11:00 am with Dr. Patsi [facility]. Aware NPO after MN   Urinary Retention  Foley catheter draining yellow urine. Scheduled for TURP with Dr. Patsi [facility] 07/15/2015  Anemia of chronic disease Has CKD Monitor CBC   Patient is being discharged with the following home health services:   None   Patient is being discharged with the following durable medical equipment:    Standard Wheelchair   No RX written D/C [employer].   Patient has been advised to f/u with their PCP in 1-2 weeks to bring them up to date on their rehab stay.  Social services at facility was responsible for arranging this appointment.  Future labs/tests needed:  None

## 2015-07-15 ENCOUNTER — Encounter (HOSPITAL_COMMUNITY): Payer: Self-pay | Admitting: *Deleted

## 2015-07-15 ENCOUNTER — Encounter (HOSPITAL_COMMUNITY)
Admission: RE | Disposition: A | Discharge status: Home / Self Care | Payer: Self-pay | Source: Ambulatory Visit | Attending: Urology

## 2015-07-15 ENCOUNTER — Inpatient Hospital Stay (HOSPITAL_COMMUNITY): Payer: Medicare Other | Admitting: Anesthesiology

## 2015-07-15 ENCOUNTER — Inpatient Hospital Stay (HOSPITAL_COMMUNITY)
Admission: RE | Admit: 2015-07-15 | Discharge: 2015-07-19 | DRG: 714 | Disposition: A | Discharge status: Home / Self Care | Payer: Medicare Other | Source: Ambulatory Visit | Attending: Urology | Admitting: Urology

## 2015-07-15 DIAGNOSIS — N138 Other obstructive and reflux uropathy: Secondary | ICD-10-CM | POA: Diagnosis present

## 2015-07-15 DIAGNOSIS — R338 Other retention of urine: Secondary | ICD-10-CM | POA: Diagnosis present

## 2015-07-15 DIAGNOSIS — N210 Calculus in bladder: Secondary | ICD-10-CM | POA: Diagnosis present

## 2015-07-15 DIAGNOSIS — N281 Cyst of kidney, acquired: Secondary | ICD-10-CM | POA: Diagnosis present

## 2015-07-15 DIAGNOSIS — N312 Flaccid neuropathic bladder, not elsewhere classified: Secondary | ICD-10-CM | POA: Diagnosis present

## 2015-07-15 DIAGNOSIS — N135 Crossing vessel and stricture of ureter without hydronephrosis: Secondary | ICD-10-CM | POA: Diagnosis present

## 2015-07-15 DIAGNOSIS — N401 Enlarged prostate with lower urinary tract symptoms: Secondary | ICD-10-CM | POA: Diagnosis present

## 2015-07-15 DIAGNOSIS — Z841 Family history of disorders of kidney and ureter: Secondary | ICD-10-CM

## 2015-07-15 DIAGNOSIS — Z87891 Personal history of nicotine dependence: Secondary | ICD-10-CM

## 2015-07-15 HISTORY — DX: Other disorders resulting from impaired renal tubular function: N25.89

## 2015-07-15 HISTORY — PX: CYSTOSCOPY WITH LITHOLAPAXY: SHX1425

## 2015-07-15 HISTORY — DX: Encephalopathy, unspecified: G93.40

## 2015-07-15 HISTORY — DX: Acute kidney failure, unspecified: N17.9

## 2015-07-15 HISTORY — DX: Bladder-neck obstruction: N32.0

## 2015-07-15 HISTORY — DX: Elevated white blood cell count, unspecified: D72.829

## 2015-07-15 HISTORY — DX: Unspecified protein-calorie malnutrition: E46

## 2015-07-15 HISTORY — DX: Retention of urine, unspecified: R33.9

## 2015-07-15 HISTORY — DX: Presence of other specified devices: Z97.8

## 2015-07-15 HISTORY — PX: TRANSURETHRAL RESECTION OF PROSTATE: SHX73

## 2015-07-15 HISTORY — DX: Urinary tract infection, site not specified: N39.0

## 2015-07-15 HISTORY — DX: Muscle weakness (generalized): M62.81

## 2015-07-15 HISTORY — DX: Presence of urogenital implants: Z96.0

## 2015-07-15 HISTORY — DX: Latent syphilis, unspecified as early or late: A53.0

## 2015-07-15 HISTORY — DX: Thyrotoxicosis, unspecified without thyrotoxic crisis or storm: E05.90

## 2015-07-15 HISTORY — DX: Difficulty in walking, not elsewhere classified: R26.2

## 2015-07-15 HISTORY — DX: Unspecified hydronephrosis: N13.30

## 2015-07-15 HISTORY — DX: Dependence on renal dialysis: Z99.2

## 2015-07-15 HISTORY — DX: Pneumonia, unspecified organism: J18.9

## 2015-07-15 HISTORY — DX: Anemia, unspecified: D64.9

## 2015-07-15 LAB — BASIC METABOLIC PANEL WITH GFR
Anion gap: 12 (ref 5–15)
BUN: 43 mg/dL — ABNORMAL HIGH (ref 6–20)
CO2: 20 mmol/L — ABNORMAL LOW (ref 22–32)
Calcium: 8.3 mg/dL — ABNORMAL LOW (ref 8.9–10.3)
Chloride: 112 mmol/L — ABNORMAL HIGH (ref 101–111)
Creatinine, Ser: 2.05 mg/dL — ABNORMAL HIGH (ref 0.61–1.24)
GFR calc Af Amer: 33 mL/min — ABNORMAL LOW (ref >60)
GFR calc non Af Amer: 28 mL/min — ABNORMAL LOW (ref >60)
Glucose, Bld: 126 mg/dL — ABNORMAL HIGH (ref 65–99)
Potassium: 4.4 mmol/L (ref 3.5–5.1)
Sodium: 144 mmol/L (ref 135–145)

## 2015-07-15 LAB — BASIC METABOLIC PANEL
Anion gap: 9 (ref 5–15)
BUN: 44 mg/dL — AB (ref 6–20)
CALCIUM: 9 mg/dL (ref 8.9–10.3)
CO2: 25 mmol/L (ref 22–32)
CREATININE: 2.32 mg/dL — AB (ref 0.61–1.24)
Chloride: 108 mmol/L (ref 101–111)
GFR, EST AFRICAN AMERICAN: 28 mL/min — AB (ref >60)
GFR, EST NON AFRICAN AMERICAN: 24 mL/min — AB (ref >60)
Glucose, Bld: 106 mg/dL — ABNORMAL HIGH (ref 65–99)
Potassium: 4.4 mmol/L (ref 3.5–5.1)
SODIUM: 142 mmol/L (ref 135–145)

## 2015-07-15 LAB — CBC
HCT: 24.4 % — ABNORMAL LOW (ref 39.0–52.0)
Hemoglobin: 7.5 g/dL — ABNORMAL LOW (ref 13.0–17.0)
MCH: 27.3 pg (ref 26.0–34.0)
MCHC: 30.7 g/dL (ref 30.0–36.0)
MCV: 88.7 fL (ref 78.0–100.0)
PLATELETS: 390 10*3/uL (ref 150–400)
RBC: 2.75 MIL/uL — AB (ref 4.22–5.81)
RDW: 14.5 % (ref 11.5–15.5)
WBC: 13.4 10*3/uL — AB (ref 4.0–10.5)

## 2015-07-15 LAB — HEMOGLOBIN AND HEMATOCRIT, BLOOD
HEMATOCRIT: 23.4 % — AB (ref 39.0–52.0)
Hemoglobin: 7.3 g/dL — ABNORMAL LOW (ref 13.0–17.0)

## 2015-07-15 LAB — ABO/RH: ABO/RH(D): A POS

## 2015-07-15 LAB — PREPARE RBC (CROSSMATCH)

## 2015-07-15 SURGERY — CYSTOSCOPY, WITH BLADDER CALCULUS LITHOLAPAXY
Anesthesia: General | Site: Ureter

## 2015-07-15 MED ORDER — NEPRO/CARBSTEADY PO LIQD
237.0000 mL | Freq: Two times a day (BID) | ORAL | Status: DC
  Administered 2015-07-16 – 2015-07-19 (×4): 237 mL via ORAL
  Filled 2015-07-15 (×8): qty 237

## 2015-07-15 MED ORDER — FENTANYL CITRATE (PF) 100 MCG/2ML IJ SOLN
INTRAMUSCULAR | Status: AC
Start: 2015-07-15 — End: 2015-07-16
  Filled 2015-07-15: qty 2

## 2015-07-15 MED ORDER — CEFAZOLIN SODIUM-DEXTROSE 2-3 GM-% IV SOLR
2.0000 g | INTRAVENOUS | Status: AC
  Administered 2015-07-15: 2 g via INTRAVENOUS

## 2015-07-15 MED ORDER — STERILE WATER FOR IRRIGATION IR SOLN
Status: DC | PRN
  Administered 2015-07-15: 30 mL

## 2015-07-15 MED ORDER — SULFAMETHOXAZOLE-TRIMETHOPRIM 400-80 MG PO TABS
1.0000 | ORAL_TABLET | Freq: Two times a day (BID) | ORAL | Status: DC
  Administered 2015-07-15 – 2015-07-19 (×8): 1 via ORAL
  Filled 2015-07-15 (×11): qty 1

## 2015-07-15 MED ORDER — ONDANSETRON HCL 4 MG/2ML IJ SOLN
INTRAMUSCULAR | Status: AC
  Filled 2015-07-15: qty 2

## 2015-07-15 MED ORDER — CEFAZOLIN SODIUM-DEXTROSE 2-3 GM-% IV SOLR
INTRAVENOUS | Status: AC
  Filled 2015-07-15: qty 50

## 2015-07-15 MED ORDER — SODIUM CHLORIDE 0.9 % IV SOLN
Freq: Once | INTRAVENOUS | Status: AC
  Administered 2015-07-15: 15:00:00 via INTRAVENOUS

## 2015-07-15 MED ORDER — ACETAMINOPHEN 325 MG PO TABS: 650.0000 mg | ORAL_TABLET | ORAL | Status: DC | PRN

## 2015-07-15 MED ORDER — SENNOSIDES-DOCUSATE SODIUM 8.6-50 MG PO TABS
2.0000 | ORAL_TABLET | Freq: Every day | ORAL | Status: DC
Start: 2015-07-15 — End: 2015-07-19
  Administered 2015-07-15 – 2015-07-18 (×4): 2 via ORAL
  Filled 2015-07-15 (×4): qty 2

## 2015-07-15 MED ORDER — FENTANYL CITRATE (PF) 100 MCG/2ML IJ SOLN
25.0000 ug | INTRAMUSCULAR | Status: DC | PRN
  Administered 2015-07-15: 50 ug via INTRAVENOUS

## 2015-07-15 MED ORDER — LIDOCAINE HCL (CARDIAC) 20 MG/ML IV SOLN
INTRAVENOUS | Status: AC
  Filled 2015-07-15: qty 5

## 2015-07-15 MED ORDER — LIDOCAINE HCL 2 % EX GEL
CUTANEOUS | Status: AC
  Filled 2015-07-15: qty 5

## 2015-07-15 MED ORDER — ONDANSETRON HCL 4 MG/2ML IJ SOLN: 4.0000 mg | Freq: Once | INTRAMUSCULAR | Status: DC | PRN

## 2015-07-15 MED ORDER — SODIUM CHLORIDE 0.9 % IV SOLN
INTRAVENOUS | Status: DC
  Administered 2015-07-15: 1000 mL via INTRAVENOUS

## 2015-07-15 MED ORDER — FENTANYL CITRATE (PF) 100 MCG/2ML IJ SOLN
INTRAMUSCULAR | Status: AC
  Filled 2015-07-15: qty 2

## 2015-07-15 MED ORDER — OXYCODONE-ACETAMINOPHEN 5-325 MG PO TABS
1.0000 | ORAL_TABLET | ORAL | Status: DC | PRN
  Administered 2015-07-16: 2 via ORAL
  Administered 2015-07-16 (×2): 1 via ORAL
  Administered 2015-07-17 (×2): 2 via ORAL
  Administered 2015-07-17 – 2015-07-19 (×3): 1 via ORAL
  Filled 2015-07-15: qty 1
  Filled 2015-07-15: qty 2
  Filled 2015-07-15: qty 1
  Filled 2015-07-15: qty 2
  Filled 2015-07-15 (×3): qty 1
  Filled 2015-07-15: qty 2

## 2015-07-15 MED ORDER — BELLADONNA ALKALOIDS-OPIUM 16.2-60 MG RE SUPP
RECTAL | Status: AC
  Filled 2015-07-15: qty 1

## 2015-07-15 MED ORDER — PROPOFOL 10 MG/ML IV BOLUS
INTRAVENOUS | Status: AC
  Filled 2015-07-15: qty 20

## 2015-07-15 MED ORDER — SODIUM CHLORIDE 0.9 % IR SOLN
Status: DC | PRN
  Administered 2015-07-15: 6000 mL via INTRAVESICAL
  Administered 2015-07-15: 22000 mL via INTRAVESICAL
  Administered 2015-07-15: 3000 mL via INTRAVESICAL

## 2015-07-15 MED ORDER — PHENYLEPHRINE 40 MCG/ML (10ML) SYRINGE FOR IV PUSH (FOR BLOOD PRESSURE SUPPORT)
PREFILLED_SYRINGE | INTRAVENOUS | Status: AC
  Filled 2015-07-15: qty 10

## 2015-07-15 MED ORDER — SODIUM CHLORIDE 0.45 % IV SOLN
INTRAVENOUS | Status: DC
  Administered 2015-07-15 – 2015-07-18 (×3): via INTRAVENOUS

## 2015-07-15 MED ORDER — PROPOFOL 10 MG/ML IV BOLUS
INTRAVENOUS | Status: DC | PRN
  Administered 2015-07-15: 40 mg via INTRAVENOUS
  Administered 2015-07-15 (×2): 50 mg via INTRAVENOUS

## 2015-07-15 MED ORDER — ADULT MULTIVITAMIN W/MINERALS CH
1.0000 | ORAL_TABLET | Freq: Every day | ORAL | Status: DC
  Administered 2015-07-15 – 2015-07-19 (×5): 1 via ORAL
  Filled 2015-07-15 (×5): qty 1

## 2015-07-15 MED ORDER — HYDROMORPHONE HCL 1 MG/ML IJ SOLN: 0.5000 mg | INTRAMUSCULAR | Status: DC | PRN

## 2015-07-15 MED ORDER — IOHEXOL 300 MG/ML  SOLN
INTRAMUSCULAR | Status: DC | PRN
  Administered 2015-07-15: 10 mL via INTRAVENOUS

## 2015-07-15 MED ORDER — ONDANSETRON HCL 4 MG/2ML IJ SOLN: 4.0000 mg | INTRAMUSCULAR | Status: DC | PRN

## 2015-07-15 MED ORDER — BACITRACIN-NEOMYCIN-POLYMYXIN 400-5-5000 EX OINT: 1.0000 "application " | TOPICAL_OINTMENT | Freq: Three times a day (TID) | CUTANEOUS | Status: DC | PRN

## 2015-07-15 MED ORDER — ZOLPIDEM TARTRATE 5 MG PO TABS: 5.0000 mg | ORAL_TABLET | Freq: Every evening | ORAL | Status: DC | PRN

## 2015-07-15 MED ORDER — INFLUENZA VAC SPLIT QUAD 0.5 ML IM SUSY
0.5000 mL | PREFILLED_SYRINGE | INTRAMUSCULAR | Status: DC
  Filled 2015-07-15 (×4): qty 0.5

## 2015-07-15 MED ORDER — BELLADONNA ALKALOIDS-OPIUM 16.2-60 MG RE SUPP
RECTAL | Status: DC | PRN
  Administered 2015-07-15: 1 via RECTAL

## 2015-07-15 MED ORDER — LIDOCAINE HCL (CARDIAC) 20 MG/ML IV SOLN
INTRAVENOUS | Status: DC | PRN
  Administered 2015-07-15: 50 mg via INTRAVENOUS

## 2015-07-15 MED ORDER — PHENYLEPHRINE HCL 10 MG/ML IJ SOLN
INTRAMUSCULAR | Status: DC | PRN
  Administered 2015-07-15 (×10): 80 ug via INTRAVENOUS

## 2015-07-15 MED ORDER — FENTANYL CITRATE (PF) 100 MCG/2ML IJ SOLN
INTRAMUSCULAR | Status: DC | PRN
  Administered 2015-07-15 (×8): 50 ug via INTRAVENOUS

## 2015-07-15 MED ORDER — FLUORESCEIN SODIUM 10 % IJ SOLN
500.0000 mg | INTRAMUSCULAR | Status: AC
  Administered 2015-07-15: 300 mg via INTRAVENOUS
  Filled 2015-07-15: qty 5

## 2015-07-15 SURGICAL SUPPLY — 33 items
BAG URINE DRAINAGE (UROLOGICAL SUPPLIES) IMPLANT
BAG URO CATCHER STRL LF (MISCELLANEOUS) ×5 IMPLANT
BASKET LASER NITINOL 1.9FR (BASKET) ×5 IMPLANT
BASKET STNLS GEMINI 4WIRE 3FR (BASKET) IMPLANT
BASKET ZERO TIP NITINOL 2.4FR (BASKET) IMPLANT
CATH HEMA 3WAY 30CC 22FR COUDE (CATHETERS) IMPLANT
CATH HEMA 3WAY 30CC 24FR COUDE (CATHETERS) ×5 IMPLANT
CATH INTERMIT  6FR 70CM (CATHETERS) ×5 IMPLANT
CATH URET DUAL LUMEN 6-10FR 50 (CATHETERS) IMPLANT
CLOTH BEACON ORANGE TIMEOUT ST (SAFETY) ×5 IMPLANT
FIBER LASER FLEXIVA 1000 (UROLOGICAL SUPPLIES) ×5 IMPLANT
FIBER LASER FLEXIVA 200 (UROLOGICAL SUPPLIES) IMPLANT
FIBER LASER FLEXIVA 365 (UROLOGICAL SUPPLIES) IMPLANT
FIBER LASER FLEXIVA 550 (UROLOGICAL SUPPLIES) IMPLANT
FIBER LASER TRAC TIP (UROLOGICAL SUPPLIES) IMPLANT
GLOVE BIOGEL M STRL SZ7.5 (GLOVE) ×5 IMPLANT
GOWN STRL REUS W/TWL LRG LVL3 (GOWN DISPOSABLE) ×5 IMPLANT
GOWN STRL REUS W/TWL XL LVL3 (GOWN DISPOSABLE) ×5 IMPLANT
GUIDEWIRE ANG ZIPWIRE 038X150 (WIRE) ×5 IMPLANT
GUIDEWIRE STR DUAL SENSOR (WIRE) ×5 IMPLANT
HOLDER FOLEY CATH W/STRAP (MISCELLANEOUS) IMPLANT
IV NS 1000ML (IV SOLUTION) ×2
IV NS 1000ML BAXH (IV SOLUTION) ×3 IMPLANT
LOOP CUT BIPOLAR 24F LRG (ELECTROSURGICAL) ×5 IMPLANT
LOOP CUTTING 26FR OLYMPUS (CUTTING LOOP) ×5 IMPLANT
MANIFOLD NEPTUNE II (INSTRUMENTS) ×5 IMPLANT
PACK CYSTO (CUSTOM PROCEDURE TRAY) ×5 IMPLANT
SCRUB PCMX 4 OZ (MISCELLANEOUS) IMPLANT
STENT CONTOUR URETERAL (STENTS) IMPLANT
SYR 30ML LL (SYRINGE) ×5 IMPLANT
SYRINGE IRR TOOMEY STRL 70CC (SYRINGE) ×10 IMPLANT
TUBING CONNECTING 10 (TUBING) ×8 IMPLANT
TUBING CONNECTING 10' (TUBING) ×2

## 2015-07-15 NOTE — Transfer of Care (Signed)
Immediate Anesthesia Transfer of Care Note  Patient: Jeff Parks  Procedure(s) Performed: Procedure(s): CYSTOSCOPY, URETEROSCOPY, BILATERAL RETROGRADE PYELOGRAM WITH LITHOLAPAXY, HOLIUM LASER APPLICATION  (N/A) TRANSURETHRAL RESECTION OF THE PROSTATE (TURP) (N/A)  Patient Location: PACU  Anesthesia Type:General  Level of Consciousness: awake, alert  and patient cooperative  Airway & Oxygen Therapy: Patient Spontanous Breathing and Patient connected to nasal cannula oxygen  Post-op Assessment: Report given to RN and Post -op Vital signs reviewed and stable  Post vital signs: Reviewed and stable  Last Vitals:  Filed Vitals:   07/15/15 0817  BP: 106/64  Pulse: 105  Temp: 36.7 C  Resp: 16    Complications: No apparent anesthesia complications

## 2015-07-15 NOTE — Anesthesia Procedure Notes (Signed)
Procedure Name: LMA Insertion Date/Time: 07/15/2015 11:36 AM Performed by: Orest DikesPETERS, Mayleigh Tetrault J Pre-anesthesia Checklist: Patient identified, Emergency Drugs available, Suction available, Timeout performed and Patient being monitored Patient Re-evaluated:Patient Re-evaluated prior to inductionOxygen Delivery Method: Circle system utilized Preoxygenation: Pre-oxygenation with 100% oxygen Intubation Type: IV induction Ventilation: Mask ventilation without difficulty LMA: LMA inserted LMA Size: 5.0 Placement Confirmation: positive ETCO2 and breath sounds checked- equal and bilateral Tube secured with: Tape Dental Injury: Teeth and Oropharynx as per pre-operative assessment

## 2015-07-15 NOTE — Anesthesia Postprocedure Evaluation (Signed)
Anesthesia Post Note  Patient: Jeff Parks  Procedure(s) Performed: Procedure(s) (LRB): CYSTOSCOPY, URETEROSCOPY, BILATERAL RETROGRADE PYELOGRAM WITH LITHOLAPAXY, HOLIUM LASER APPLICATION  (N/A) TRANSURETHRAL RESECTION OF THE PROSTATE (TURP) (N/A)  Patient location during evaluation: PACU Anesthesia Type: General Level of consciousness: awake and alert Pain management: pain level controlled Vital Signs Assessment: post-procedure vital signs reviewed and stable Respiratory status: spontaneous breathing, nonlabored ventilation, respiratory function stable and patient connected to nasal cannula oxygen Cardiovascular status: blood pressure returned to baseline and tachycardic Postop Assessment: no signs of nausea or vomiting Anesthetic complications: no Comments: EBL close to 500cc for case, blood remains in catheter, Hgb dropped to 7.3 and patient tachycardic in the PACU to 120. Discussed with Dr. Marcello Parks and plan to give 2 units pRBCs.     Last Vitals:  Filed Vitals:   07/15/15 1415 07/15/15 1430  BP: 130/84 118/66  Pulse: 122 121  Temp:    Resp: 14 17    Last Pain: There were no vitals filed for this visit.               Jeff Parks

## 2015-07-15 NOTE — Anesthesia Preprocedure Evaluation (Signed)
Anesthesia Evaluation  Patient identified by MRN, date of birth, ID band Patient awake    Reviewed: Allergy & Precautions, NPO status , Patient's Chart, lab work & pertinent test results  History of Anesthesia Complications Negative for: history of anesthetic complications  Airway Mallampati: II  TM Distance: >3 FB Neck ROM: Full    Dental no notable dental hx. (+) Dental Advisory Given   Pulmonary neg pulmonary ROS,    Pulmonary exam normal breath sounds clear to auscultation       Cardiovascular negative cardio ROS Normal cardiovascular exam Rhythm:Regular Rate:Normal     Neuro/Psych negative neurological ROS  negative psych ROS   GI/Hepatic negative GI ROS, Neg liver ROS,   Endo/Other  negative endocrine ROS  Renal/GU Renal disease  negative genitourinary   Musculoskeletal negative musculoskeletal ROS (+)   Abdominal   Peds negative pediatric ROS (+)  Hematology negative hematology ROS (+)   Anesthesia Other Findings   Reproductive/Obstetrics negative OB ROS                             Anesthesia Physical Anesthesia Plan  ASA: III  Anesthesia Plan: General   Post-op Pain Management:    Induction: Intravenous  Airway Management Planned: LMA  Additional Equipment:   Intra-op Plan:   Post-operative Plan: Extubation in OR  Informed Consent: I have reviewed the patients History and Physical, chart, labs and discussed the procedure including the risks, benefits and alternatives for the proposed anesthesia with the patient or authorized representative who has indicated his/her understanding and acceptance.   Dental advisory given  Plan Discussed with: CRNA  Anesthesia Plan Comments:         Anesthesia Quick Evaluation

## 2015-07-15 NOTE — Interval H&P Note (Signed)
History and Physical Interval Note:  07/15/2015 11:19 AM  Jeff SalisburyErnest Parks  has presented today for surgery, with the diagnosis of URINARY RETENTION AND BLADDER STONE  The various methods of treatment have been discussed with the patient and family. After consideration of risks, benefits and other options for treatment, the patient has consented to  Procedure(s): CYSTOSCOPY WITH LITHOLAPAXY (N/A) BILATERAL  PYELOGRAM, RETROGRADE  AND  JJ STENT PLACEMENT (Bilateral) TRANSURETHRAL RESECTION OF THE PROSTATE (TURP) (N/A) as a surgical intervention .  The patient's history has been reviewed, patient examined, no change in status, stable for surgery.  I have reviewed the patient's chart and labs.  Questions were answered to the patient's satisfaction.     Jeff Parks I Jeff Parks

## 2015-07-15 NOTE — Op Note (Signed)
Pre-operative diagnosis :   BPH, Bladder stones, Acute and Chronic Urinary Retention  Postoperative diagnosis:  Same  Operation:  Cysto, rectal exam under anesthesia, bilateral retrograde pyelograms, ureteroscopy, attempted ureteral intubation, cystolitholapaxy of 6 bladder stones, TURP. fluorescein dye.   Surgeon:  Kathie RhodesS. Patsi Searsannenbaum, MD  First assistant:  none  Anesthesia:  General LMA  Preparation:  After appropriate preanesthesia, the patient was brought to the operative room, placed on the operative table in the dorsal supine position where general LMA anesthesia was introduced. He was then replaced in the dorsal lithotomy position with a pubis was prepped with Betadine solution and draped in the usual fashion. The history was double checked.  Review history:  Problems  1. BPH (benign prostatic hypertrophy) with urinary retention (N40.1,R33.8)  Assessed By: Jethro Bolusannenbaum, Krish Bailly (Urology); Last Assessed: 13 Jul 2015  History of Present Illness    82 yo male currently residing at [facility] ( skilled nursing), presents today for f/u after recent hospitalization from 2/13 - 06/28/15 for hx of urinary retention & constipation. He had a Renal u/s that showed bilateral hydronephrosis, debris/multiple bladder stones within the bladder, bilateral renal cysts, and a 14mm Lt renal complex cyst. Foley was inserted & drained 2.15 liters of urine. He had an elevated Creatinine of 14.2 and GFR of 3. Creatinine on 06/22/15 was 7.85, He is scheduled for cystolithopexy, Holmium laser/bilateral RPG/bilateral JJ stents/TURP on 07/15/15.   Past Medical History Problems  1. History of No significant past medical history   Statement of  Likelihood of Success: Excellent. TIME-OUT observed.:  Procedure:  Rectal examination was accomplished, showed a 4+ prostate, which was not hard. There was no rectal masses appreciated.  Cystoscopies, us, and showed trabeculation and cellule formation. 6 separate bladder  stones were identified. Trilobar BPH was noted. Bleeding in the bladder and the prostate was noted. I attempted to identify the ureteral orifices bilaterally. Floor seen dye was given, but it was not seen for greater than 20 minutes. Attempted retrograde pyelogram was performed, after identifying the ureteral orifices, but retrograde contrast could not be passed, and Glidewire could not be passed up the ureter. Therefore, it attendant was directed to the bladder stones. The 1000  laser fiber was used, at settings of 0.5/1.0, and 1.0/10 and 1.0/15.; With fragmentation of the stones and removal of some of stones. However, due to very poor visualization, I felt it was impossible to continue with this portion of the procedure. I elected to proceed with transrectal resection of prostate, and the resection was accomplished from the 7:00 to the 5:00 position, from the 10:00 to the 7:00 position, and from the 2:00 to the 5:00 position, with chips sent to laboratory for examination. A 26 Foley catheter was placed to traction and continuous irrigation. The patient was then awakened taken recovery room in good condition.

## 2015-07-16 ENCOUNTER — Encounter (HOSPITAL_COMMUNITY): Payer: Self-pay | Admitting: Radiology

## 2015-07-16 ENCOUNTER — Inpatient Hospital Stay (HOSPITAL_COMMUNITY): Payer: Medicare Other

## 2015-07-16 LAB — BASIC METABOLIC PANEL
Anion gap: 8 (ref 5–15)
BUN: 39 mg/dL — AB (ref 6–20)
CHLORIDE: 110 mmol/L (ref 101–111)
CO2: 21 mmol/L — AB (ref 22–32)
CREATININE: 2.1 mg/dL — AB (ref 0.61–1.24)
Calcium: 8 mg/dL — ABNORMAL LOW (ref 8.9–10.3)
GFR calc Af Amer: 32 mL/min — ABNORMAL LOW (ref >60)
GFR calc non Af Amer: 27 mL/min — ABNORMAL LOW (ref >60)
GLUCOSE: 111 mg/dL — AB (ref 65–99)
POTASSIUM: 4.9 mmol/L (ref 3.5–5.1)
Sodium: 139 mmol/L (ref 135–145)

## 2015-07-16 LAB — POCT I-STAT 4, (NA,K, GLUC, HGB,HCT)
GLUCOSE: 93 mg/dL (ref 65–99)
HEMATOCRIT: 28 % — AB (ref 39.0–52.0)
HEMOGLOBIN: 9.5 g/dL — AB (ref 13.0–17.0)
POTASSIUM: 4.1 mmol/L (ref 3.5–5.1)
Sodium: 141 mmol/L (ref 135–145)

## 2015-07-16 LAB — HEMOGLOBIN AND HEMATOCRIT, BLOOD
HCT: 25.5 % — ABNORMAL LOW (ref 39.0–52.0)
Hemoglobin: 8.2 g/dL — ABNORMAL LOW (ref 13.0–17.0)

## 2015-07-16 NOTE — Progress Notes (Signed)
Urology Progress Note  1 Day Post-Op   Subjective: S-p cysto, TURP and Ho, laser 1-2/ 6  large egg size stones. Attempted bilateral retrograde pyelograms failed. Case discussed with pt and his wife.     No acute urologic events overnight. Ambulation:   negative Flatus:    positive Bowel movement  negative  Pain: some relief  Objective:  Blood pressure 91/52, pulse 92, temperature 98.7 F (37.1 C), temperature source Oral, resp. rate 16, height 6' (1.829 m), weight 72.122 kg (159 lb), SpO2 98 %.  Physical Exam:  General:  No acute distress, awake Extremities: extremities normal, atraumatic, no cyanosis or edema Genitourinary:  Urine pink. No clots. Poor irrigation.  Foley: poor irrigation 2ndary stones.     I/O last 3 completed shifts: In: 15177.5 [I.V.:1200; Blood:777.5; Other:13200] Out: [id number] [XBJYN:82956[Urine:16925; Blood:500]  Recent Labs     07/15/15  0835   07/15/15  1409  07/16/15  0546  HGB  7.5*   < >  7.3*  8.2*  WBC  13.4*   --    --    --   PLT  390   --    --    --    < > = values in this interval not displayed.    Recent Labs     07/15/15  1409  07/16/15  0546  NA  144  139  K  4.4  4.9  CL  112*  110  CO2  20*  21*  BUN  43*  39*  CREATININE  2.05*  2.10*  CALCIUM  8.3*  8.0*  GFRNONAA  28*  27*  GFRAA  33*  32*     No results for input(s): INR, APTT in the last 72 hours.  Invalid input(s): PT   Invalid input(s): ABG  Assessment/Plan:  Catheter not removed. Transfusion post op. Hgb 8.2 this AM. Cr 2.10, gfr 32 ( best it has been).    Case discussed with pt and wife. He has long-standing large hard bladder stones, and needs open bladder stone removal. Best time for surgery will be Tuesday pm, and I will plan for that.   He will have CT ( non-contrast of abd and pelvis), and Lasix renal scan for evaluation and in preparation for surgery. ( ? Need for JJ stents or percs) ( external ureteral obstruction). .Marland Kitchen

## 2015-07-17 LAB — CBC WITH DIFFERENTIAL/PLATELET
BASOS ABS: 0 10*3/uL (ref 0.0–0.1)
Basophils Relative: 0 %
EOS PCT: 1 %
Eosinophils Absolute: 0.2 10*3/uL (ref 0.0–0.7)
HCT: 22.4 % — ABNORMAL LOW (ref 39.0–52.0)
Hemoglobin: 7.2 g/dL — ABNORMAL LOW (ref 13.0–17.0)
LYMPHS ABS: 3 10*3/uL (ref 0.7–4.0)
Lymphocytes Relative: 17 %
MCH: 28.6 pg (ref 26.0–34.0)
MCHC: 32.1 g/dL (ref 30.0–36.0)
MCV: 88.9 fL (ref 78.0–100.0)
Monocytes Absolute: 0.8 10*3/uL (ref 0.1–1.0)
Monocytes Relative: 5 %
Neutro Abs: 13.3 10*3/uL — ABNORMAL HIGH (ref 1.7–7.7)
Neutrophils Relative %: 77 %
PLATELETS: 267 10*3/uL (ref 150–400)
RBC: 2.52 MIL/uL — AB (ref 4.22–5.81)
RDW: 14.9 % (ref 11.5–15.5)
WBC: 17.4 10*3/uL — AB (ref 4.0–10.5)

## 2015-07-17 LAB — BASIC METABOLIC PANEL
Anion gap: 8 (ref 5–15)
BUN: 37 mg/dL — AB (ref 6–20)
CO2: 21 mmol/L — ABNORMAL LOW (ref 22–32)
Calcium: 8 mg/dL — ABNORMAL LOW (ref 8.9–10.3)
Chloride: 111 mmol/L (ref 101–111)
Creatinine, Ser: 2.14 mg/dL — ABNORMAL HIGH (ref 0.61–1.24)
GFR, EST AFRICAN AMERICAN: 31 mL/min — AB (ref >60)
GFR, EST NON AFRICAN AMERICAN: 27 mL/min — AB (ref >60)
Glucose, Bld: 140 mg/dL — ABNORMAL HIGH (ref 65–99)
POTASSIUM: 4.3 mmol/L (ref 3.5–5.1)
SODIUM: 140 mmol/L (ref 135–145)

## 2015-07-17 NOTE — Progress Notes (Signed)
Urology Progress Note  2 Days Post-Op . Pre-op removal of bladder stones Tuesday. Pt counseled again and understands. Wife counseled yesterday also.   Subjective: Urine clear this am ( light pink)     No acute urologic events overnight. Ambulation:   negative Flatus:    positive Bowel movement  negative  Pain: some relief  Objective:  Blood pressure 98/55, pulse 101, temperature 98.7 F (37.1 C), temperature source Oral, resp. rate 18, height 6' (1.829 m), weight 72.122 kg (159 lb), SpO2 99 %.  Physical Exam:  General:  No acute distress, awake  Genitourinary:   Flat abdomen Foley: remains    I/O last 3 completed shifts: In: 30320 [P.O.:180; I.V.:1600; Blood:340; Other:28200] Out: 32350 [Urine:32350]  Recent Labs     07/15/15  0835   07/16/15  0546  07/17/15  0545  HGB  7.5*   < >  8.2*  7.2*  WBC  13.4*   --    --   17.4*  PLT  390   --    --   267   < > = values in this interval not displayed.    Recent Labs     07/16/15  0546  07/17/15  0545  NA  139  140  K  4.9  4.3  CL  110  111  CO2  21*  21*  BUN  39*  37*  CREATININE  2.10*  2.14*  CALCIUM  8.0*  8.0*  GFRNONAA  27*  27*  GFRAA  32*  31*     No results for input(s): INR, APTT in the last 72 hours.  Invalid input(s): PT   Invalid input(s): ABG  Assessment/Plan:  Pt is 82 yo male who presented to ER with 2L urinary retention and Cr 12 and GFR=3-7. After acute dialysis, renal u/s showed reasonable renal substance, and he underwent TURP, and attempted cystolitholapaxy, and attempted bilateral retrograde pyelography. However, retrogrades failed to show ureters, and guidewires would not go up ureters. Stones were very hard. Fluorescein dye did show after delay of 20=-30 min. He is due for nuclear med scan tomorrow, and open stone removal Tuesday ( 6 large egg-sized stones). Lasix renal scan tomorrow to evaluate whether he needs JJ stents. Possible s-p tube also.   Catheter not removed. Plan for open  surgery Tuesday.      

## 2015-07-17 NOTE — Progress Notes (Signed)
Utilization Review Completed.Jeff Parks T3/19/2017  

## 2015-07-18 ENCOUNTER — Inpatient Hospital Stay (HOSPITAL_COMMUNITY): Payer: Medicare Other

## 2015-07-18 ENCOUNTER — Encounter (HOSPITAL_COMMUNITY)
Admission: RE | Disposition: A | Discharge status: Home / Self Care | Payer: Self-pay | Source: Ambulatory Visit | Attending: Urology

## 2015-07-18 ENCOUNTER — Inpatient Hospital Stay (HOSPITAL_COMMUNITY): Payer: Medicare Other | Admitting: Anesthesiology

## 2015-07-18 ENCOUNTER — Encounter (HOSPITAL_COMMUNITY): Payer: Self-pay | Admitting: Certified Registered Nurse Anesthetist

## 2015-07-18 HISTORY — PX: HOLMIUM LASER APPLICATION: SHX5852

## 2015-07-18 HISTORY — PX: PROSTATECTOMY: SHX69

## 2015-07-18 LAB — BASIC METABOLIC PANEL
Anion gap: 9 (ref 5–15)
BUN: 26 mg/dL — ABNORMAL HIGH (ref 6–20)
CHLORIDE: 111 mmol/L (ref 101–111)
CO2: 24 mmol/L (ref 22–32)
Calcium: 8.7 mg/dL — ABNORMAL LOW (ref 8.9–10.3)
Creatinine, Ser: 1.82 mg/dL — ABNORMAL HIGH (ref 0.61–1.24)
GFR, EST AFRICAN AMERICAN: 38 mL/min — AB (ref >60)
GFR, EST NON AFRICAN AMERICAN: 32 mL/min — AB (ref >60)
Glucose, Bld: 102 mg/dL — ABNORMAL HIGH (ref 65–99)
POTASSIUM: 4.4 mmol/L (ref 3.5–5.1)
SODIUM: 144 mmol/L (ref 135–145)

## 2015-07-18 LAB — DIFFERENTIAL
BASOS PCT: 1 %
BLASTS: 0 %
Band Neutrophils: 0 %
Basophils Absolute: 0.1 10*3/uL (ref 0.0–0.1)
EOS PCT: 3 %
Eosinophils Absolute: 0.4 10*3/uL (ref 0.0–0.7)
LYMPHS PCT: 15 %
Lymphs Abs: 1.9 10*3/uL (ref 0.7–4.0)
MONO ABS: 0.5 10*3/uL (ref 0.1–1.0)
Metamyelocytes Relative: 0 %
Monocytes Relative: 4 %
Myelocytes: 0 %
NRBC: 0 /100{WBCs}
Neutro Abs: 9.8 10*3/uL — ABNORMAL HIGH (ref 1.7–7.7)
Neutrophils Relative %: 77 %
OTHER: 0 %
PROMYELOCYTES ABS: 0 %

## 2015-07-18 LAB — TYPE AND SCREEN
ABO/RH(D): A POS
ANTIBODY SCREEN: NEGATIVE
Unit division: 0
Unit division: 0

## 2015-07-18 LAB — CBC
HEMATOCRIT: 24.7 % — AB (ref 39.0–52.0)
HEMOGLOBIN: 7.8 g/dL — AB (ref 13.0–17.0)
MCH: 28.2 pg (ref 26.0–34.0)
MCHC: 31.6 g/dL (ref 30.0–36.0)
MCV: 89.2 fL (ref 78.0–100.0)
Platelets: 324 10*3/uL (ref 150–400)
RBC: 2.77 MIL/uL — AB (ref 4.22–5.81)
RDW: 15.1 % (ref 11.5–15.5)
WBC: 12.7 10*3/uL — ABNORMAL HIGH (ref 4.0–10.5)

## 2015-07-18 LAB — PREPARE RBC (CROSSMATCH)

## 2015-07-18 SURGERY — PROSTATECTOMY, SUPRAPUBIC APPROACH
Anesthesia: General | Site: Abdomen

## 2015-07-18 MED ORDER — FENTANYL CITRATE (PF) 100 MCG/2ML IJ SOLN
INTRAMUSCULAR | Status: DC | PRN
  Administered 2015-07-18: 100 ug via INTRAVENOUS
  Administered 2015-07-18 (×2): 50 ug via INTRAVENOUS

## 2015-07-18 MED ORDER — SODIUM CHLORIDE 0.9 % IJ SOLN
INTRAMUSCULAR | Status: DC | PRN
  Administered 2015-07-18: 20 mL

## 2015-07-18 MED ORDER — HYDROMORPHONE HCL 2 MG/ML IJ SOLN
INTRAMUSCULAR | Status: AC
  Filled 2015-07-18: qty 1

## 2015-07-18 MED ORDER — BUPIVACAINE LIPOSOME 1.3 % IJ SUSP
20.0000 mL | Freq: Once | INTRAMUSCULAR | Status: AC
  Administered 2015-07-18: 20 mL
  Filled 2015-07-18: qty 20

## 2015-07-18 MED ORDER — NEOSTIGMINE METHYLSULFATE 10 MG/10ML IV SOLN
INTRAVENOUS | Status: DC | PRN
  Administered 2015-07-18: 4 mg via INTRAVENOUS

## 2015-07-18 MED ORDER — SODIUM CHLORIDE 0.9 % IR SOLN
Status: DC | PRN
  Administered 2015-07-18: 3000 mL

## 2015-07-18 MED ORDER — SODIUM CHLORIDE 0.9 % IV SOLN
INTRAVENOUS | Status: DC | PRN
Start: 2015-07-18 — End: 2015-07-18
  Administered 2015-07-18: 18:00:00 via INTRAVENOUS

## 2015-07-18 MED ORDER — DEXTROSE 5 % IV SOLN
INTRAVENOUS | Status: AC
  Filled 2015-07-18: qty 2

## 2015-07-18 MED ORDER — LACTATED RINGERS IV SOLN: INTRAVENOUS | Status: DC

## 2015-07-18 MED ORDER — GLYCOPYRROLATE 0.2 MG/ML IJ SOLN
INTRAMUSCULAR | Status: DC | PRN
  Administered 2015-07-18: .6 mg via INTRAVENOUS

## 2015-07-18 MED ORDER — LIDOCAINE HCL (CARDIAC) 20 MG/ML IV SOLN
INTRAVENOUS | Status: DC | PRN
  Administered 2015-07-18: 80 mg via INTRATRACHEAL

## 2015-07-18 MED ORDER — ONDANSETRON HCL 4 MG/2ML IJ SOLN
INTRAMUSCULAR | Status: DC | PRN
  Administered 2015-07-18: 4 mg via INTRAVENOUS

## 2015-07-18 MED ORDER — PROPOFOL 10 MG/ML IV BOLUS
INTRAVENOUS | Status: AC
  Filled 2015-07-18: qty 20

## 2015-07-18 MED ORDER — SODIUM CHLORIDE 0.45 % IV SOLN
INTRAVENOUS | Status: DC
  Administered 2015-07-18: 22:00:00 via INTRAVENOUS

## 2015-07-18 MED ORDER — FUROSEMIDE 10 MG/ML IJ SOLN: 36.0500 mg | Freq: Once | INTRAMUSCULAR | Status: DC

## 2015-07-18 MED ORDER — ONDANSETRON HCL 4 MG/2ML IJ SOLN
INTRAMUSCULAR | Status: AC
  Filled 2015-07-18: qty 2

## 2015-07-18 MED ORDER — LACTATED RINGERS IV SOLN
INTRAVENOUS | Status: DC | PRN
  Administered 2015-07-18 (×2): via INTRAVENOUS

## 2015-07-18 MED ORDER — PHENYLEPHRINE 40 MCG/ML (10ML) SYRINGE FOR IV PUSH (FOR BLOOD PRESSURE SUPPORT)
PREFILLED_SYRINGE | INTRAVENOUS | Status: AC
  Filled 2015-07-18: qty 20

## 2015-07-18 MED ORDER — FENTANYL CITRATE (PF) 100 MCG/2ML IJ SOLN
INTRAMUSCULAR | Status: AC
  Filled 2015-07-18: qty 2

## 2015-07-18 MED ORDER — DEXAMETHASONE SODIUM PHOSPHATE 10 MG/ML IJ SOLN
INTRAMUSCULAR | Status: AC
  Filled 2015-07-18: qty 1

## 2015-07-18 MED ORDER — FUROSEMIDE 10 MG/ML IJ SOLN
INTRAMUSCULAR | Status: AC
  Filled 2015-07-18: qty 4

## 2015-07-18 MED ORDER — DEXTROSE 5 % IV SOLN
2.0000 g | INTRAVENOUS | Status: DC | PRN
  Administered 2015-07-18: 2 g via INTRAVENOUS

## 2015-07-18 MED ORDER — PROPOFOL 10 MG/ML IV BOLUS
INTRAVENOUS | Status: DC | PRN
  Administered 2015-07-18: 120 mg via INTRAVENOUS
  Administered 2015-07-18: 80 mg via INTRAVENOUS

## 2015-07-18 MED ORDER — HYDROMORPHONE HCL 1 MG/ML IJ SOLN
INTRAMUSCULAR | Status: DC | PRN
  Administered 2015-07-18 (×2): 1 mg via INTRAVENOUS

## 2015-07-18 MED ORDER — ROCURONIUM BROMIDE 100 MG/10ML IV SOLN
INTRAVENOUS | Status: DC | PRN
  Administered 2015-07-18: 30 mg via INTRAVENOUS
  Administered 2015-07-18 (×2): 10 mg via INTRAVENOUS

## 2015-07-18 MED ORDER — FENTANYL CITRATE (PF) 100 MCG/2ML IJ SOLN: 25.0000 ug | INTRAMUSCULAR | Status: DC | PRN

## 2015-07-18 MED ORDER — TECHNETIUM TC 99M MERTIATIDE
15.3000 | Freq: Once | INTRAVENOUS | Status: AC | PRN
  Administered 2015-07-18: 15.3 via INTRAVENOUS

## 2015-07-18 MED ORDER — PHENYLEPHRINE HCL 10 MG/ML IJ SOLN
INTRAMUSCULAR | Status: DC | PRN
  Administered 2015-07-18: 120 ug via INTRAVENOUS
  Administered 2015-07-18: 80 ug via INTRAVENOUS
  Administered 2015-07-18 (×2): 120 ug via INTRAVENOUS

## 2015-07-18 SURGICAL SUPPLY — 55 items
BAG URINE DRAINAGE (UROLOGICAL SUPPLIES) ×4 IMPLANT
BLADE EXTENDED COATED 6.5IN (ELECTRODE) ×4 IMPLANT
BLADE HEX COATED 2.75 (ELECTRODE) ×4 IMPLANT
BLADE SURG SZ12 CARB STEEL (BLADE) ×4 IMPLANT
CATH 18FR 3 WAY 30 (CATHETERS) ×2
CATH 18FR 3WAY 30CC (CATHETERS) ×2 IMPLANT
CATH FOLEY 2WAY SLVR 30CC 22FR (CATHETERS) IMPLANT
CATH FOLEY 2WAY SLVR 30CC 24FR (CATHETERS) IMPLANT
CATH FOLEY 3WAY 30CC 24FR (CATHETERS) ×2
CATH URTH STD 24FR FL 3W 2 (CATHETERS) ×2 IMPLANT
CLIP LIGATING HEM O LOK PURPLE (MISCELLANEOUS) ×8 IMPLANT
COVER SURGICAL LIGHT HANDLE (MISCELLANEOUS) ×4 IMPLANT
DRAIN CHANNEL 10F 3/8 F FF (DRAIN) IMPLANT
DRAPE LAPAROTOMY T 102X78X121 (DRAPES) ×4 IMPLANT
DRAPE UTILITY 15X26 (DRAPE) ×4 IMPLANT
DRAPE WARM FLUID 44X44 (DRAPE) ×4 IMPLANT
DRSG OPSITE POSTOP 4X6 (GAUZE/BANDAGES/DRESSINGS) ×4 IMPLANT
ELECT REM PT RETURN 9FT ADLT (ELECTROSURGICAL) ×4
ELECTRODE REM PT RTRN 9FT ADLT (ELECTROSURGICAL) ×2 IMPLANT
EVACUATOR SILICONE 100CC (DRAIN) IMPLANT
FIBER LASER FLEXIVA 1000 (UROLOGICAL SUPPLIES) ×4 IMPLANT
GAUZE SPONGE 4X4 16PLY XRAY LF (GAUZE/BANDAGES/DRESSINGS) ×4 IMPLANT
GLOVE BIOGEL M STRL SZ7.5 (GLOVE) ×4 IMPLANT
GOWN STRL REUS W/TWL XL LVL3 (GOWN DISPOSABLE) ×4 IMPLANT
GUIDEWIRE STR DUAL SENSOR (WIRE) ×4 IMPLANT
KIT BASIN OR (CUSTOM PROCEDURE TRAY) ×4 IMPLANT
NEEDLE HYPO 22GX1.5 SAFETY (NEEDLE) ×4 IMPLANT
NS IRRIG 1000ML POUR BTL (IV SOLUTION) ×8 IMPLANT
PACK GENERAL/GYN (CUSTOM PROCEDURE TRAY) ×4 IMPLANT
PLUG CATH AND CAP STER (CATHETERS) ×8 IMPLANT
SCRUB PCMX 4 OZ (MISCELLANEOUS) ×4 IMPLANT
SET IRRIG Y TYPE TUR BLADDER L (SET/KITS/TRAYS/PACK) IMPLANT
SPOGE SURGIFLO 8M (HEMOSTASIS)
SPONGE LAP 18X18 X RAY DECT (DISPOSABLE) ×4 IMPLANT
SPONGE LAP 4X18 X RAY DECT (DISPOSABLE) ×4 IMPLANT
SPONGE SURGIFLO 8M (HEMOSTASIS) IMPLANT
STAPLER SKIN PROX WIDE 3.9 (STAPLE) IMPLANT
STAPLER VISISTAT 35W (STAPLE) ×4 IMPLANT
SURGIFLO W/THROMBIN 8M KIT (HEMOSTASIS) ×4 IMPLANT
SURGILUBE 3G PEEL PACK STRL (MISCELLANEOUS) ×16 IMPLANT
SUT ETHILON 3 0 PS 1 (SUTURE) ×8 IMPLANT
SUT PDS AB 1 CTX 36 (SUTURE) ×8 IMPLANT
SUT PDS AB 1 TP1 96 (SUTURE) ×4 IMPLANT
SUT SILK 0 (SUTURE) ×2
SUT SILK 0 30XBRD TIE 6 (SUTURE) ×2 IMPLANT
SUT SILK 2 0 (SUTURE)
SUT SILK 2-0 30XBRD TIE 12 (SUTURE) IMPLANT
SUT VIC AB 0 UR5 27 (SUTURE) ×16 IMPLANT
SUT VIC AB 2-0 UR5 27 (SUTURE) IMPLANT
SUT VIC AB 2-0 UR6 27 (SUTURE) IMPLANT
SYR 30ML LL (SYRINGE) ×8 IMPLANT
SYR CONTROL 10ML LL (SYRINGE) ×4 IMPLANT
TOWEL OR 17X26 10 PK STRL BLUE (TOWEL DISPOSABLE) ×4 IMPLANT
TOWEL OR NON WOVEN STRL DISP B (DISPOSABLE) ×4 IMPLANT
WATER STERILE IRR 1500ML POUR (IV SOLUTION) ×4 IMPLANT

## 2015-07-18 NOTE — Anesthesia Postprocedure Evaluation (Signed)
Anesthesia Post Note  Patient: Jeff Parks  Procedure(s) Performed: Procedure(s) (LRB): OPEN BLADDER STONE REMOVAL (N/A)  Patient location during evaluation: PACU Anesthesia Type: General Level of consciousness: awake and alert Pain management: pain level controlled Vital Signs Assessment: post-procedure vital signs reviewed and stable Respiratory status: spontaneous breathing, nonlabored ventilation, respiratory function stable and patient connected to nasal cannula oxygen Cardiovascular status: blood pressure returned to baseline and stable Postop Assessment: no signs of nausea or vomiting Anesthetic complications: no    Last Vitals:  Filed Vitals:   07/18/15 2015 07/18/15 2030  BP: 105/69 122/62  Pulse: 84 82  Temp:  36.4 C  Resp: 14 14    Last Pain:  Filed Vitals:   07/18/15 2032  PainSc: Asleep                 Keven Soucy L

## 2015-07-18 NOTE — Progress Notes (Signed)
Patient refused morning labs. 3 attempts were made between this RN and the lab tech over 30 mins of time. Patient was educated on the need for labs and still declined. Lab will attempt to collect labs on day shift.  Jeff Gaudierristy Anarosa Kubisiak, RN

## 2015-07-18 NOTE — Interval H&P Note (Signed)
History and Physical Interval Note:  07/18/2015 5:35 PM  Jeff Parks  has presented today for surgery, with the diagnosis of large bladder stones  The various methods of treatment have been discussed with the patient and family. After consideration of risks, benefits and other options for treatment, the patient has consented to  Procedure(s): OPEN BLADDER STONE REMOVAL, POSSIBLE OPEN PROSTATE TISSUE REMOVAL POSSIBLE SUPRAPUBIC TUBE PLACEMENT AND POSSIBLE BILATERAL DOUBLE J STENT PLACEMENT (N/A) as a surgical intervention .  The patient's history has been reviewed, patient examined, no change in status, stable for surgery.  I have reviewed the patient's chart and labs.  Questions were answered to the patient's satisfaction.     Jeff Parks

## 2015-07-18 NOTE — Transfer of Care (Signed)
Immediate Anesthesia Transfer of Care Note  Patient: Jeff Parks  Procedure(s) Performed: Procedure(s): OPEN BLADDER STONE REMOVAL (N/A)  Patient Location: PACU  Anesthesia Type:General  Level of Consciousness: sedated  Airway & Oxygen Therapy: Patient Spontanous Breathing and Patient connected to face mask oxygen  Post-op Assessment: Report given to RN and Post -op Vital signs reviewed and stable  Post vital signs: Reviewed and stable  Last Vitals:  Filed Vitals:   07/18/15 1518 07/18/15 1955  BP: 112/59   Pulse: 102 95  Temp: 36.9 C   Resp: 20     Complications: No apparent anesthesia complications

## 2015-07-18 NOTE — Care Management Important Message (Signed)
Important Message  Patient Details  Name: Emelia Salisburyrnest Anding MRN: [id number] Date of Birth: May 02, 1929   Medicare Important Message Given:  Yes    Haskell FlirtJamison, Kyree Adriano 07/18/2015, 11:01 AMImportant Message  Patient Details  Name: Emelia Salisburyrnest Blumenfeld MRN: [id number] Date of Birth: May 02, 1929   Medicare Important Message Given:  Yes    Haskell FlirtJamison, Mykira Hofmeister 07/18/2015, 11:01 AM

## 2015-07-18 NOTE — Op Note (Signed)
Post-operative Diagnosis: Bladder stones  Procedure and Anesthesia:  Procedure(s) and Anesthesia Type:    * OPEN BLADDER STONE REMOVAL - General  Surgeon: Surgeon(s) and Role:    * Jethro BolusSigmund Tannenbaum, MD - Primary   Resident:  Juliane PootPeter S Rogen Porte  EBL: 100  IVF: See anesthesia record  Drains: 18 F 3-way foley  Implants: None  Specimens: None  Complications: None  Indications for Surgery: 82 y.o. male with bladder stones. Risks, benefits, and alternatives of the above procedure were discussed previously in detail and informed consents was signed and verified.  Findings: Multiple large bladder stones within the bladder. TUR defect noted, thick walled bladder with trabeculation.    Procedure Details:   The patient and consent were verified in the pre-op holding area and brought to the operating room where they were placed on the operating table. Pre-induction time out was called and general anesthesia was induced. SCDs were placed and IV antibiotics were started. The patient was positioned with an axillary roll behind his hips and the table was flexed. He was placed in Trendelenberg's position to adequately expose the pelvis. He was clipped, prepped and draped in the usual sterile fashion using betadine.  A 20 Fr foley catheter was placed on the sterile field in standard fashion. After a pre-incision time-out was performed we made a roughly 5 cm low midline incision from pubis up cephalad a hands breadth below the umbilicus in the midline. We carefully dissected through the subcutaneous tissues using electrocautery. We identified the linea alba in the midline and came through exposing the belly of the rectus muscle which was split in a muscle sparing fashion. The anterior and lateral attachments of the bladder were taken down partially using a combination of blunt dissection, sharp dissection and electrocautery.   We released traction on the bladder and 2-0 Vicryl stay sutures were placed in  the bladder at the anterior mid bladder and a vertical incision was made through the detrusor and into the bladder where the foley catheter balloon was identified. There were multiple large stones within the bladder that were easily removed. We irrigated the bladder multiple times to ensure all stones removed. We then applied surgiflo to the TUR defect and applied pressure for 5 minutes for added hemostasis from his previous TUR.  We exchanged the 20 Fr foley for an 18 Fr 3-way hematuria catheter in case CBI would be needed. The catheter was placed on traction. We then proceeded to close the bladder in two layers (one mucosal layer using 2-0 Vicryl and one imbricating detrusor layer using 2-0 Vicryl).   The closure was tested and was water tight.   We then turned our attention to closing. We closed fascia with two separate running 0- looped PDS sutures in standard fashion. The skin was closed with staples. Exparel was injected in the deep tissue layers prior to closing. Waffle dressing was applied. The catheter was placed on traction and was irrigated. Draining urine was thin, Kool aid colored and without clot. At this point the procedure was complete, anesthesia was reversed and the patient awoke having tolerated the procedure well. He was taken to the PACU in stable condition.   Post-operative plan: -The patient will be admitted for ongoing post-surgical care -He will be discharged home with foley catheter in place x 10 days and will follow-up in the office with Dr. Patsi Searsannenbaum with a cystogram at that time   Teaching Physician Attestation: Dr. Patsi Searsannenbaum was present and scrubbed for the entirety of the  procedure

## 2015-07-18 NOTE — Anesthesia Procedure Notes (Signed)
Procedure Name: Intubation Date/Time: 07/18/2015 6:13 PM Performed by: Epimenio SarinJARVELA, Joyleen Haselton R Pre-anesthesia Checklist: Patient identified, Emergency Drugs available, Suction available, Patient being monitored and Timeout performed Patient Re-evaluated:Patient Re-evaluated prior to inductionOxygen Delivery Method: Circle system utilized Preoxygenation: Pre-oxygenation with 100% oxygen Intubation Type: IV induction Ventilation: Mask ventilation without difficulty and Oral airway inserted - appropriate to patient size Laryngoscope Size: Mac and 3 Grade View: Grade I Tube type: Oral Tube size: 7.5 mm Number of attempts: 1 Airway Equipment and Method: Stylet Placement Confirmation: ETT inserted through vocal cords under direct vision,  positive ETCO2 and breath sounds checked- equal and bilateral Secured at: 23 cm Tube secured with: Tape Dental Injury: Teeth and Oropharynx as per pre-operative assessment

## 2015-07-18 NOTE — H&P (View-Only) (Signed)
Urology Progress Note  2 Days Post-Op . Pre-op removal of bladder stones Tuesday. Pt counseled again and understands. Wife counseled yesterday also.   Subjective: Urine clear this am ( light pink)     No acute urologic events overnight. Ambulation:   negative Flatus:    positive Bowel movement  negative  Pain: some relief  Objective:  Blood pressure 98/55, pulse 101, temperature 98.7 F (37.1 C), temperature source Oral, resp. rate 18, height 6' (1.829 m), weight 72.122 kg (159 lb), SpO2 99 %.  Physical Exam:  General:  No acute distress, awake  Genitourinary:   Flat abdomen Foley: remains    I/O last 3 completed shifts: In: 4098130320 [P.O.:180; I.V.:1600; Blood:340; Other:28200] Out: 32350 [Urine:32350]  Recent Labs     07/15/15  0835   07/16/15  0546  07/17/15  0545  HGB  7.5*   < >  8.2*  7.2*  WBC  13.4*   --    --   17.4*  PLT  390   --    --   267   < > = values in this interval not displayed.    Recent Labs     07/16/15  0546  07/17/15  0545  NA  139  140  K  4.9  4.3  CL  110  111  CO2  21*  21*  BUN  39*  37*  CREATININE  2.10*  2.14*  CALCIUM  8.0*  8.0*  GFRNONAA  27*  27*  GFRAA  32*  31*     No results for input(s): INR, APTT in the last 72 hours.  Invalid input(s): PT   Invalid input(s): ABG  Assessment/Plan:  Pt is 82 yo male who presented to ER with 2L urinary retention and Cr 12 and GFR=3-7. After acute dialysis, renal u/s showed reasonable renal substance, and he underwent TURP, and attempted cystolitholapaxy, and attempted bilateral retrograde pyelography. However, retrogrades failed to show ureters, and guidewires would not go up ureters. Stones were very hard. Fluorescein dye did show after delay of 20=-30 min. He is due for nuclear med scan tomorrow, and open stone removal Tuesday ( 6 large egg-sized stones). Lasix renal scan tomorrow to evaluate whether he needs JJ stents. Possible s-p tube also.   Catheter not removed. Plan for open  surgery Tuesday.

## 2015-07-18 NOTE — Anesthesia Preprocedure Evaluation (Addendum)
Anesthesia Evaluation  Patient identified by MRN, date of birth, ID band Patient awake    Reviewed: Allergy & Precautions, H&P , NPO status , Patient's Chart, lab work & pertinent test results  Airway Mallampati: II  TM Distance: >3 FB Neck ROM: full    Dental  (+) Dental Advisory Given, Edentulous Upper, Edentulous Lower   Pulmonary neg pulmonary ROS,    Pulmonary exam normal breath sounds clear to auscultation       Cardiovascular Exercise Tolerance: Poor hypertension, Normal cardiovascular exam Rhythm:regular Rate:Normal     Neuro/Psych Latent syphillis. Difficulty walking negative psych ROS   GI/Hepatic negative GI ROS, Neg liver ROS, malnutrition   Endo/Other  negative endocrine ROS  Renal/GU Dialysis and CRFRenal disease  negative genitourinary   Musculoskeletal   Abdominal   Peds  Hematology negative hematology ROS (+) anemia , hgb 7.8   Anesthesia Other Findings   Reproductive/Obstetrics negative OB ROS                            Anesthesia Physical Anesthesia Plan  ASA: III  Anesthesia Plan: General   Post-op Pain Management:    Induction: Intravenous  Airway Management Planned: LMA  Additional Equipment:   Intra-op Plan:   Post-operative Plan:   Informed Consent: I have reviewed the patients History and Physical, chart, labs and discussed the procedure including the risks, benefits and alternatives for the proposed anesthesia with the patient or authorized representative who has indicated his/her understanding and acceptance.   Dental Advisory Given  Plan Discussed with: CRNA and Surgeon  Anesthesia Plan Comments:         Anesthesia Quick Evaluation

## 2015-07-18 NOTE — Progress Notes (Signed)
CSW received notification pt is admitted from [facility].  BSW Intern met with pt and pt wife, Onalee Hua at bedside. BSW Intern introduced self and explained role. BSW Intern inquired about pt's plan after dc. Pt and pt wife stated the plan is to dc home after hospitalization. Pt wife interested in [facility] services at home.   BSW Intern made [facility] aware.  No further SW needs identified at this time.  BSW Intern signing off, Harlon Flor, [place] Work Department  [phone]

## 2015-07-19 ENCOUNTER — Encounter (HOSPITAL_COMMUNITY): Payer: Self-pay | Admitting: Urology

## 2015-07-19 LAB — CBC WITH DIFFERENTIAL/PLATELET
Basophils Absolute: 0 10*3/uL (ref 0.0–0.1)
Basophils Relative: 0 %
EOS ABS: 0.3 10*3/uL (ref 0.0–0.7)
Eosinophils Relative: 2 %
HCT: 30.5 % — ABNORMAL LOW (ref 39.0–52.0)
HEMOGLOBIN: 9.9 g/dL — AB (ref 13.0–17.0)
LYMPHS PCT: 14 %
Lymphs Abs: 2.3 10*3/uL (ref 0.7–4.0)
MCH: 28.7 pg (ref 26.0–34.0)
MCHC: 32.5 g/dL (ref 30.0–36.0)
MCV: 88.4 fL (ref 78.0–100.0)
MONO ABS: 1.1 10*3/uL — AB (ref 0.1–1.0)
Monocytes Relative: 7 %
NEUTROS PCT: 77 %
Neutro Abs: 12.4 10*3/uL — ABNORMAL HIGH (ref 1.7–7.7)
PLATELETS: 324 10*3/uL (ref 150–400)
RBC: 3.45 MIL/uL — ABNORMAL LOW (ref 4.22–5.81)
RDW: 15.3 % (ref 11.5–15.5)
WBC: 16.1 10*3/uL — AB (ref 4.0–10.5)

## 2015-07-19 LAB — BASIC METABOLIC PANEL
Anion gap: 10 (ref 5–15)
BUN: 24 mg/dL — AB (ref 6–20)
CHLORIDE: 111 mmol/L (ref 101–111)
CO2: 22 mmol/L (ref 22–32)
CREATININE: 1.8 mg/dL — AB (ref 0.61–1.24)
Calcium: 8.3 mg/dL — ABNORMAL LOW (ref 8.9–10.3)
GFR, EST AFRICAN AMERICAN: 38 mL/min — AB (ref >60)
GFR, EST NON AFRICAN AMERICAN: 33 mL/min — AB (ref >60)
Glucose, Bld: 104 mg/dL — ABNORMAL HIGH (ref 65–99)
POTASSIUM: 4.6 mmol/L (ref 3.5–5.1)
SODIUM: 143 mmol/L (ref 135–145)

## 2015-07-19 MED ORDER — SULFAMETHOXAZOLE-TRIMETHOPRIM 800-160 MG PO TABS: 1.0000 | ORAL_TABLET | Freq: Two times a day (BID) | ORAL | Status: DC

## 2015-07-19 MED ORDER — HYDROCODONE-ACETAMINOPHEN 5-325 MG PO TABS: 1.0000 | ORAL_TABLET | ORAL | Status: DC | PRN

## 2015-07-19 NOTE — Progress Notes (Signed)
Urology Progress Note  1 Days Post-Op from open cystolithotomy, doing well, catheter draining, some delirium this morning possible sun downing easily reoriented.   Subjective: Urine clear this am ( light pink)     No acute urologic events overnight. Ambulation:   negative Flatus:    positive Bowel movement  negative  Pain: some relief  Objective:  Blood pressure 115/44, pulse 73, temperature 98.1 F (36.7 C), temperature source Oral, resp. rate 18, height 6' (1.829 m), weight 72.122 kg (159 lb), SpO2 97 %.  Physical Exam:  General:  No acute distress, awake  Genitourinary:   Flat abdomen, incision c/d/i Foley: remains, strawberry urine    I/O last 3 completed shifts: In: 4167.5 [P.O.:360; I.V.:3137.5; Blood:670] Out: 4060 [Urine:3960; Blood:100]  Recent Labs     07/18/15  1258  07/19/15  0514  HGB  7.8*  9.9*  WBC  12.7*  16.1*  PLT  324  324    Recent Labs     07/18/15  1258  07/19/15  0514  NA  144  143  K  4.4  4.6  CL  111  111  CO2  24  22  BUN  26*  24*  CREATININE  1.82*  1.80*  CALCIUM  8.7*  8.3*  GFRNONAA  32*  33*  GFRAA  38*  38*     No results for input(s): INR, APTT in the last 72 hours.  Invalid input(s): PT   Invalid input(s): ABG  Assessment/Plan:  Pt is 82 yo male who presented to ER with 2L urinary retention and Cr 12 and GFR=3-7. After acute dialysis, renal u/s showed reasonable renal substance, and he underwent TURP, and attempted cystolitholapaxy, and attempted bilateral retrograde pyelography. However, retrogrades failed to show ureters, and guidewires would not go up ureters. Stones were very hard. Creatinine improved dramatically with foley catheter. Now s/p open cystolithotomy on 07/18/15 doing well.  Regular diet Med lock Leave foley   Urology Attending Note: Pt seen and examined today. Agree with Dr. Thomasene LotGreen's Assessment and Plan.

## 2015-07-19 NOTE — Discharge Summary (Signed)
Date of admission: 07/15/2015  Date of discharge: 07/19/2015  Admission diagnosis: BPH, Bladder stones  Discharge diagnosis: Same  Secondary diagnoses: None  History and Physical: For full details, please see admission history and physical. Briefly, Jeff Parks is a 82 y.o. year old patient with bladder stones, see H&P for further details.  Procedures: 07/15/15 TURP and cystolitholapaxy 07/18/15 open cystolithotomy.   Hospital Course: The patient underwent the above procedure. The stones were too large and hard to be broken up cystoscopically. The patient underwent a TURP at the same time and was underwent a routine hospital course after this. The patient was taken to the OR on 07/18/15 for bladder stone removal. This was successful and the patient tolerated the procedure well Their diet was slowly advanced and at the time of discharge they were tolerating a regular diet. At the time of discharge their pain was controlled with PO medications and they were ambulating without difficulty. They were discharge with a foley in place. They were discharged home on post op day 1 from the open cystolitholapaxy.    Laboratory values:   Recent Labs  07/17/15 0545 07/18/15 1258 07/19/15 0514  HGB 7.2* 7.8* 9.9*  HCT 22.4* 24.7* 30.5*    Recent Labs  07/18/15 1258 07/19/15 0514  CREATININE 1.82* 1.80*    Disposition: Home  Discharge instruction: The patient was instructed to be ambulatory but told to refrain from heavy lifting, strenuous activity, or driving.   Discharge medications:    Medication List    TAKE these medications        acetaminophen 325 MG tablet  Commonly known as:  TYLENOL  Take 650 mg by mouth every 4 (four) hours as needed (Pain).     feeding supplement (NEPRO CARB STEADY) Liqd  Take 237 mLs by mouth 2 (two) times daily between meals.     HYDROcodone-acetaminophen 5-325 MG tablet  Commonly known as:  NORCO/VICODIN  Take 1 tablet by mouth every 4 (four) hours as  needed for moderate pain.     MULTIVITAMIN ADULT PO  Take 1 tablet by mouth daily.     sulfamethoxazole-trimethoprim 800-160 MG tablet  Commonly known as:  BACTRIM DS,SEPTRA DS  Take 1 tablet by mouth 2 (two) times daily. Start taking this the day before your catheter comes out.        Followup:   1-2 weeks for foley and staples out.

## 2015-07-19 NOTE — Progress Notes (Signed)
Patient did take antibiotic, multivitamin, and nephro supplement this am for RN.  Patient refusing to allow RN to assess catheter insertion site.  RN assessed urine in tube and drainage bag.  Patient allowed RN to perform head to toe assessment, except auscultate bowel sounds and catheter insertion site.  Patient and wife are refusing to have IV restarted.  Patient is refusing to wear SCDs.  RN and NT educated wife and patient on importance of peri and foley care, but patient still refusing bath and foley care.  Wife verbalizes understanding but states " if he doesn't want to do it then lets just not do it."  RN concerned about how well wife can care for patient at home.

## 2015-07-19 NOTE — Progress Notes (Signed)
Pt's wife selected [facility]. Referral given to in house rep.

## 2015-07-19 NOTE — Progress Notes (Signed)
Pt is refusing to allow shift assessment. Will not let RN listen to heart/lungs/abdomen. Will not let RN check Foley catheter. Pulled out IV and refusing to let RN restart a new one. When RN/NT try to assist pt he will tell us to leave him alone and swing arms in combative way. Jeff Parks. Jeff Nobrega RN

## 2015-07-22 LAB — TYPE AND SCREEN
ABO/RH(D): A POS
ANTIBODY SCREEN: NEGATIVE
UNIT DIVISION: 0
UNIT DIVISION: 0
Unit division: 0
Unit division: 0

## 2016-10-18 IMAGING — CT CT ABD-PELV W/O CM
2 of 4 series · 16 of 46 positions shown, 18 images · non-contrast
Comparison: 06/19/2015

CLINICAL DATA: History of large bladder stones and failed bilateral
retrograde pyelograms

EXAM:
CT ABDOMEN AND PELVIS WITHOUT CONTRAST
TECHNIQUE: Multidetector CT imaging of the abdomen and pelvis was performed
following the standard protocol without IV contrast.

[Series 2: rtn a/p w/o · axial · non-contrast · 0.79mm/px · z∈[-438,+32]mm · 13 of 104 slices shown, 15 images]
[im 5/104  soft-tissue]
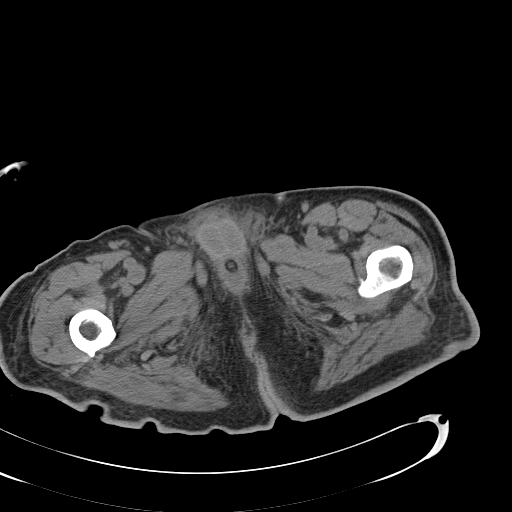
[im 5/104  bone]
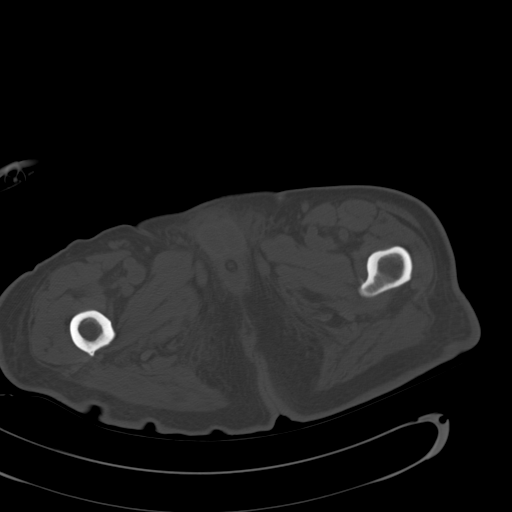
[im 13/104  soft-tissue]
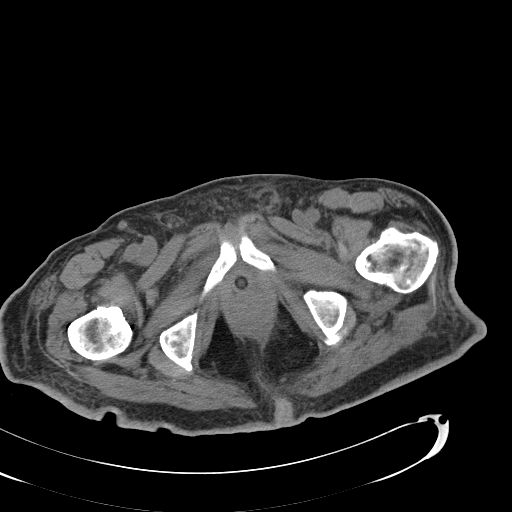
[im 21/104  soft-tissue]
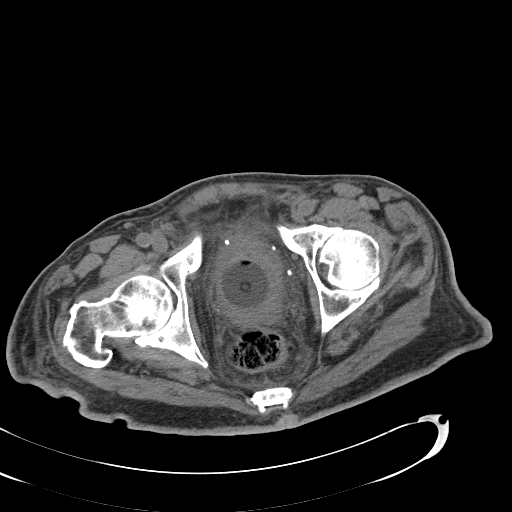
[im 29/104  soft-tissue]
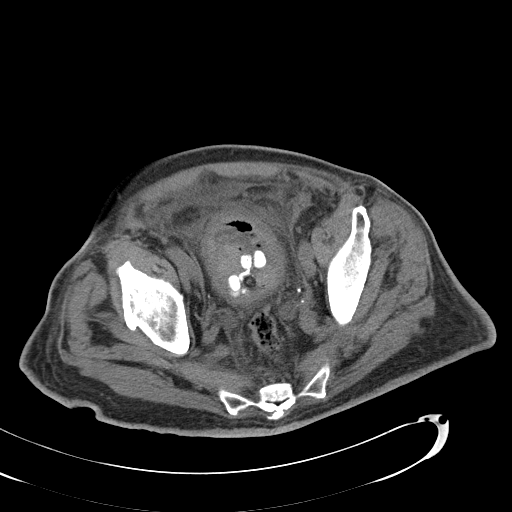
[im 38/104  soft-tissue]
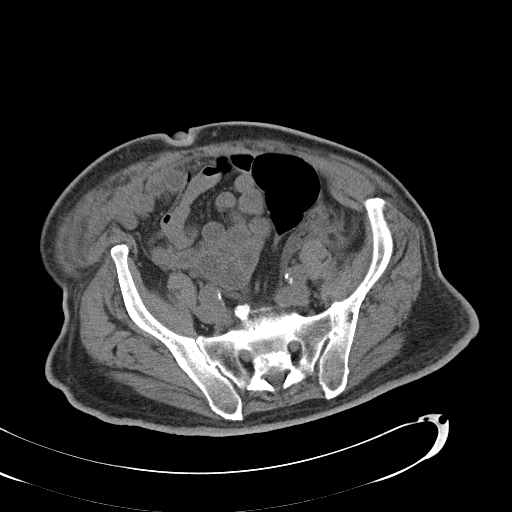
[im 46/104  soft-tissue]
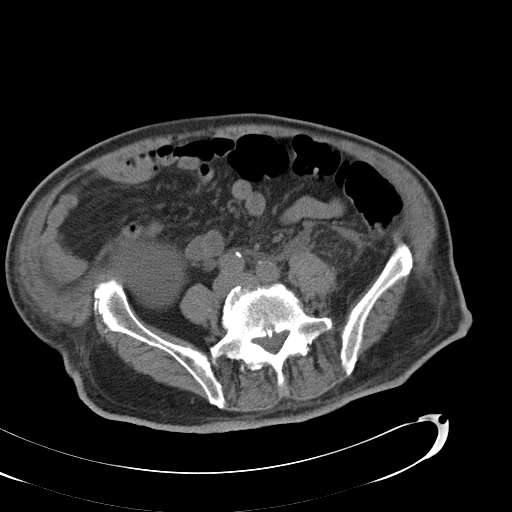
[im 54/104  soft-tissue]
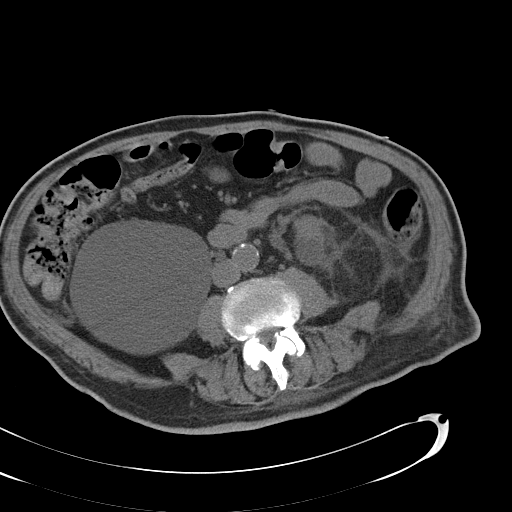
[im 58/104  soft-tissue]
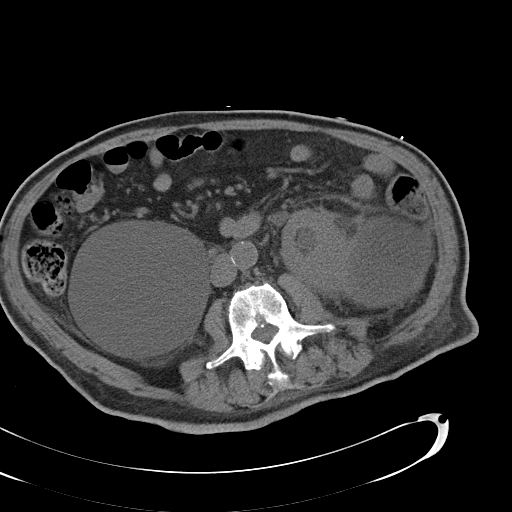
[im 66/104  soft-tissue]
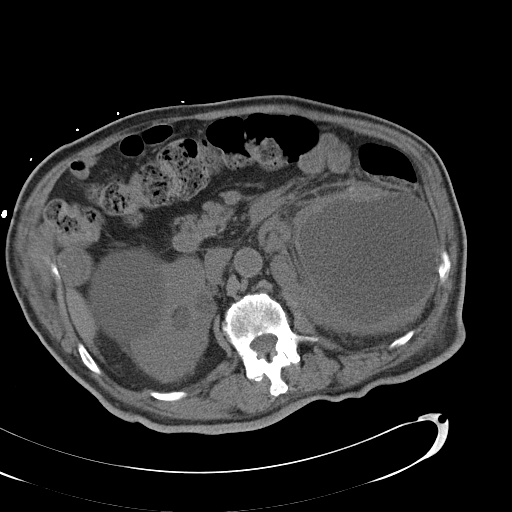
[im 66/104  bone]
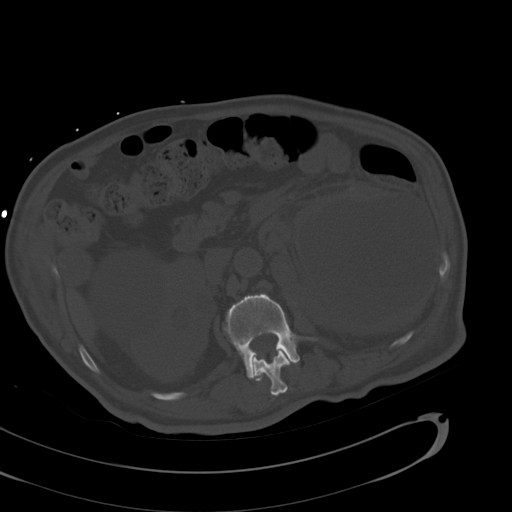
[im 75/104  soft-tissue]
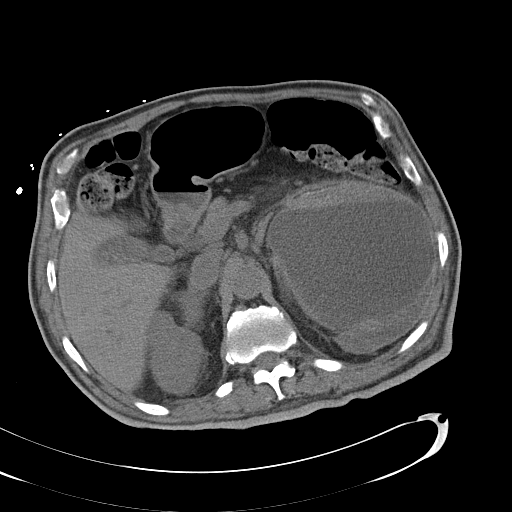
[im 83/104  soft-tissue]
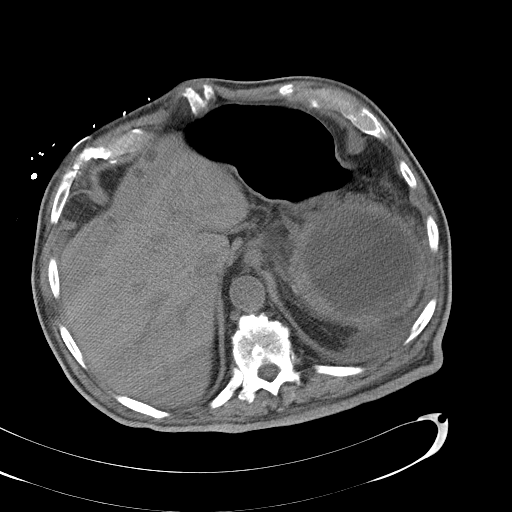
[im 91/104  soft-tissue]
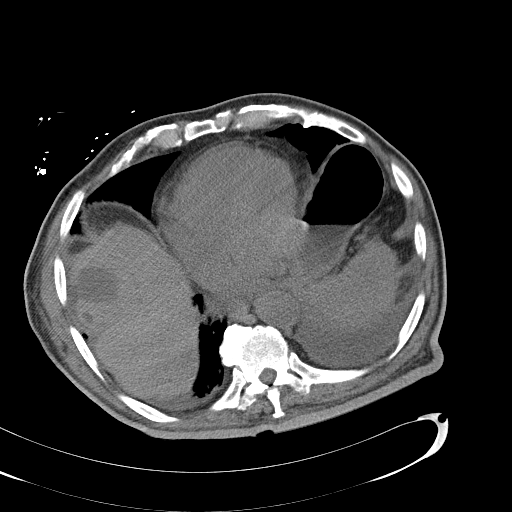
[im 99/104  soft-tissue]
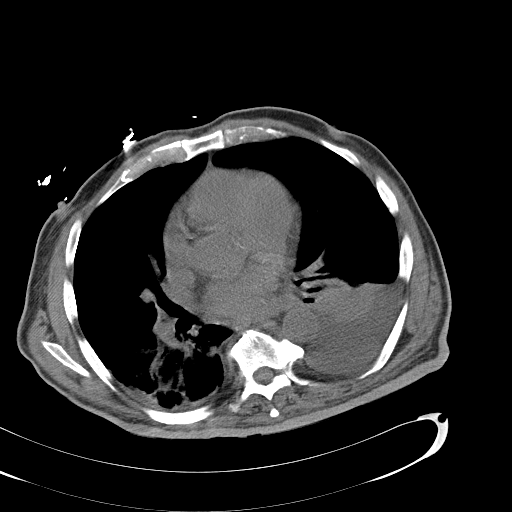

[Series 602: <mpr thick range> · coronal · 1.01mm/px · 3 of 141 slices shown]
[im 47/141  soft-tissue]
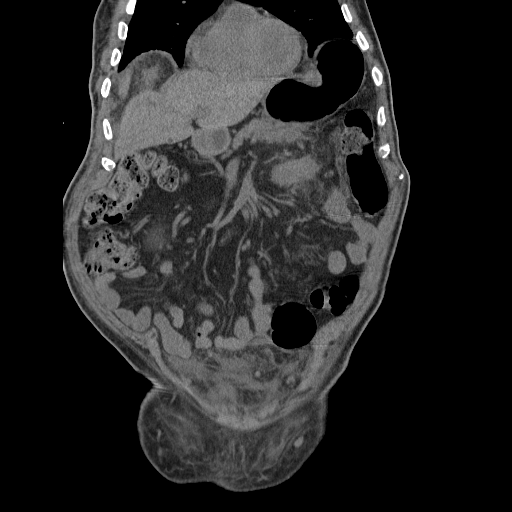
[im 63/141  soft-tissue]
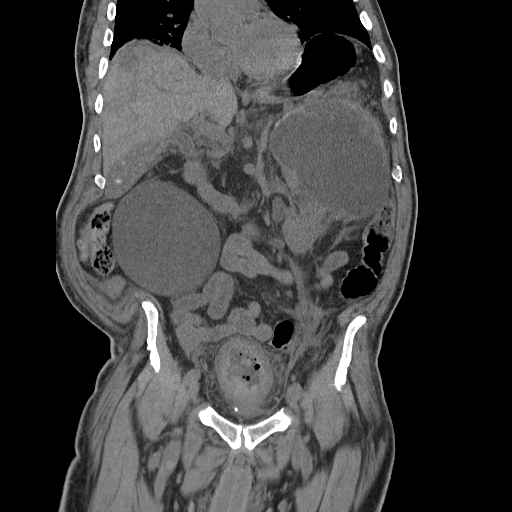
[im 78/141  soft-tissue]
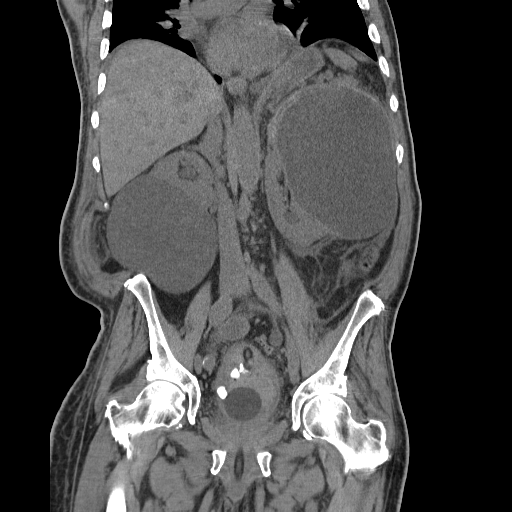

[16 of 46 positions shown; findings below may reference images not displayed]

FINDINGS: Lung bases demonstrate bilateral consolidation left greater than
right with associated large left-sided pleural effusion.

The liver again demonstrates multiple hypodensities likely
representing cysts. No biliary obstructive changes are seen. The
gallbladder is well distended demonstrates multiple gallstones. The
adrenal glands are within normal limits. The spleen and pancreas are
unremarkable.

The right kidney again demonstrates hydronephrosis as well as
significant cystic change. The large cyst on the right measures
almost 12 cm and is stable from the prior exam. The hydronephrotic
changes extend to the level of the urinary bladder similar to that
seen on the prior exam. One of the large bladder calculi as noted
adjacent to the distal aspect of the laterally placed ureter. The
bladder is decompressed by Foley catheter and demonstrates high
attenuation material consistent with thrombus. Multiple bladder
calculi are again seen and stable.

The left kidney again demonstrates a complex cystic lesions similar
to that seen on the prior exam. Left-sided hydronephrosis is again
identified extending to the level of the urinary bladder. No
definitive ureteral stone is seen.

No pelvic mass lesion is seen. No significant lymphadenopathy is
noted. The appendix is within normal limits. No colonic abnormality
is seen. The bony structures are stable from the recent exam.
IMPRESSION: Bibasilar consolidation with associated effusion left greater than
right.

Cholelithiasis without complicating factors.

Stable changes in the kidneys consisting of hydronephrosis
bilaterally as well as bilateral cystic changes or complex on the
left than the right.

Multiple bladder calculi are again seen.

The overall appearance is stable from the previous exam.

## 2016-11-25 ENCOUNTER — Encounter (HOSPITAL_COMMUNITY): Payer: Self-pay | Admitting: *Deleted

## 2016-11-25 ENCOUNTER — Ambulatory Visit (HOSPITAL_COMMUNITY): Payer: Self-pay

## 2016-11-25 ENCOUNTER — Ambulatory Visit (HOSPITAL_COMMUNITY)
Admission: EM | Admit: 2016-11-25 | Discharge: 2016-11-25 | Disposition: A | Discharge status: Home / Self Care | Payer: Medicare Other | Attending: Internal Medicine | Admitting: Internal Medicine

## 2016-11-25 DIAGNOSIS — S91311A Laceration without foreign body, right foot, initial encounter: Secondary | ICD-10-CM

## 2016-11-25 DIAGNOSIS — S9031XA Contusion of right foot, initial encounter: Secondary | ICD-10-CM

## 2016-11-25 DIAGNOSIS — S91301A Unspecified open wound, right foot, initial encounter: Secondary | ICD-10-CM
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
## 2016-11-25 NOTE — Discharge Instructions (Addendum)
Wash foot wound once to twice daily with mild soap/water, apply antibiotic ointment and bandage. Anticipate slow healing over the next several weeks. There is some risk of infection, recheck for any increasing redness/swelling/pain/drainage from the wound, or new fever greater than 100.5. Follow-up with the wound center, to guide further management, and help with any complications. Contact information for the wound center is in this handout. A postop shoe was given.

## 2016-11-25 NOTE — ED Notes (Signed)
Patient refused x ray

## 2016-11-25 NOTE — ED Notes (Signed)
Patient transported to MRI 

## 2016-11-25 NOTE — ED Notes (Signed)
Pt refusing XR.  States he doesn't need it, and wife agrees.  Dr Dayton ScrapeMurray notified.

## 2019-01-17 ENCOUNTER — Encounter (HOSPITAL_COMMUNITY): Payer: Self-pay | Admitting: Emergency Medicine

## 2019-01-17 ENCOUNTER — Other Ambulatory Visit: Payer: Self-pay

## 2019-01-17 ENCOUNTER — Ambulatory Visit (HOSPITAL_COMMUNITY)
Admission: EM | Admit: 2019-01-17 | Discharge: 2019-01-17 | Disposition: A | Discharge status: Home / Self Care | Payer: Self-pay | Attending: Emergency Medicine | Admitting: Emergency Medicine

## 2019-01-17 DIAGNOSIS — N4889 Other specified disorders of penis: Secondary | ICD-10-CM | POA: Insufficient documentation

## 2019-01-17 DIAGNOSIS — N390 Urinary tract infection, site not specified: Secondary | ICD-10-CM | POA: Insufficient documentation

## 2019-01-17 DIAGNOSIS — B356 Tinea cruris: Secondary | ICD-10-CM | POA: Insufficient documentation

## 2019-01-17 LAB — POCT URINALYSIS DIP (DEVICE)
Glucose, UA: NEGATIVE mg/dL
Ketones, ur: NEGATIVE mg/dL
Nitrite: NEGATIVE
Protein, ur: >=300 mg/dL — AB
Specific Gravity, Urine: >=1.03 (ref 1.005–1.030)
Urobilinogen, UA: 1 mg/dL (ref 0.0–1.0)
pH: 6 (ref 5.0–8.0)

## 2019-01-17 MED ORDER — MICONAZOLE NITRATE 2 % EX POWD: CUTANEOUS | 0 refills | Status: DC | PRN

## 2019-01-17 MED ORDER — CEPHALEXIN 500 MG PO CAPS: 500.0000 mg | ORAL_CAPSULE | Freq: Four times a day (QID) | ORAL | 0 refills | Status: AC

## 2019-01-17 MED ORDER — FLUCONAZOLE 150 MG PO TABS: 150.0000 mg | ORAL_TABLET | ORAL | 0 refills | Status: AC

## 2019-01-17 NOTE — ED Triage Notes (Signed)
Pt here for UTI sx x 1 week 

## 2019-01-17 NOTE — Discharge Instructions (Signed)
I am concerned about infection as well as fungal infection as source of the swelling and redness.  We will treat for both.  10 days of antibiotics as well as once a week for 4 weeks of antifungal medication. Try to keep skin clean and dry, change out of any moist undergarments promptly.  Use of antifungal powder topical and to skin folds.  Please follow up in the next two weeks with your PCP and/or urologist for recheck

## 2019-01-17 NOTE — ED Provider Notes (Signed)
[facility]    CSN: [id number] Arrival date & time: 01/17/19  1132      History   Chief Complaint Chief Complaint  Patient presents with  . Urinary Tract Infection    HPI Jeff Parks is a 85 y.o. male.   Jeff Parks presents with wife with complaints of swelling and irritation to penis which has worsened over the past week. No specific pain. No urinary symptoms. Denies incontinence, denies use of depends/adult diapers. Denies any previous similar. Wife states he is very swollen. Denies blood to urine, denies urinary retention or odor to urine. No itching. He has had some constipation but BM today. History  Of encephalopathy, aki, anemia, bladder outlet obstruction, with history  Of open bladder stone removal and prostatectomy.    ROS per HPI, negative if not otherwise mentioned.      Past Medical History:  Diagnosis Date  . Acute encephalopathy   . Acute kidney injury (HCC)   . Anemia    iron deficiency   . Bilateral hydronephrosis   . Bladder outlet obstruction   . Constipation   . Constipation   . Dialysis patient (HCC)    hx of; no longer  . Difficulty walking   . Foley catheter in place   . Hyperthyroidism   . Latent syphilis   . Leukocytosis   . Muscle weakness (generalized)   . Pneumonia 2017 feb   hospital acquired   . Protein calorie malnutrition (HCC)   . Uremic acidosis   . Urinary retention   . Urinary tract infection     Patient Active Problem List   Diagnosis Date Noted  . Benign prostatic hypertrophy with urinary obstruction 07/15/2015  . Hydronephrosis, bilateral 06/28/2015  . Bladder outlet obstruction 06/28/2015  . Acute encephalopathy 06/28/2015  . Protein-calorie malnutrition, moderate (HCC) 06/28/2015  . Hyperthyroidism 06/15/2015  . Bright red blood per rectum 06/14/2015  . Renal failure 06/13/2015  . AKI (acute kidney injury) (HCC) 06/13/2015  . Urinary retention 06/13/2015  . Anorexia 06/13/2015  . Leukocytosis  06/13/2015  . Elevated blood pressure 06/13/2015  . Anemia 06/13/2015  . Weakness     Past Surgical History:  Procedure Laterality Date  . CYSTOSCOPY WITH LITHOLAPAXY N/A 07/15/2015   Procedure: CYSTOSCOPY, URETEROSCOPY, BILATERAL RETROGRADE PYELOGRAM WITH LITHOLAPAXY, HOLIUM LASER APPLICATION ;  Surgeon: Jethro Bolus, MD;  Location: [facility];  Service: Urology;  Laterality: N/A;  . HOLMIUM LASER APPLICATION  07/18/2015   Procedure: HOLMIUM LASER APPLICATION;  Surgeon: Jethro Bolus, MD;  Location: [facility];  Service: Urology;;  . PROSTATECTOMY N/A 07/18/2015   Procedure: OPEN BLADDER STONE REMOVAL,attempted retrograde pyelogram, transurethral resection of prostate;  Surgeon: Jethro Bolus, MD;  Location: [facility];  Service: Urology;  Laterality: N/A;  . TRANSURETHRAL RESECTION OF PROSTATE N/A 07/15/2015   Procedure: TRANSURETHRAL RESECTION OF THE PROSTATE (TURP);  Surgeon: Jethro Bolus, MD;  Location: [facility];  Service: Urology;  Laterality: N/A;       Home Medications    Prior to Admission medications   Medication Sig Start Date End Date Taking? Authorizing Provider  cephALEXin (KEFLEX) 500 MG capsule Take 1 capsule (500 mg total) by mouth 4 (four) times daily for 10 days. 01/17/19 01/27/19  Georgetta Haber, NP  fluconazole (DIFLUCAN) 150 MG tablet Take 1 tablet (150 mg total) by mouth once a week for 4 doses. 01/17/19 02/08/19  Georgetta Haber, NP  miconazole (MICOTIN) 2 % powder Apply topically as needed for itching. And twice a day  regularly 01/17/19   Georgetta HaberBurky, Charletta Voight B, NP    Family History History reviewed. No pertinent family history.  Social History Social History   Tobacco Use  . Smoking status: Former Games developermoker  . Smokeless tobacco: Never Used  Substance Use Topics  . Alcohol use: No  . Drug use: No     Allergies   Patient has no known allergies.   Review of Systems Review of Systems   Physical Exam Triage Vital Signs ED Triage Vitals [01/17/19  1157]  Enc Vitals Group     BP (!) 128/95     Pulse Rate 71     Resp 18     Temp 98.5 F (36.9 C)     Temp Source Oral     SpO2 99 %     Weight      Height      Head Circumference      Peak Flow      Pain Score 3     Pain Loc      Pain Edu?      Excl. in GC?    No data found.  Updated Vital Signs BP (!) 128/95 (BP Location: Right Arm)   Pulse 71   Temp 98.5 F (36.9 C) (Oral)   Resp 18   SpO2 99%    Physical Exam Exam conducted with a chaperone present.  Constitutional:      Appearance: He is well-developed.  Cardiovascular:     Rate and Rhythm: Normal rate.  Pulmonary:     Effort: Pulmonary effort is normal.  Genitourinary:    Penis: Circumcised.      Scrotum/Testes: Normal.     Comments: Entire glans penis largely inflamed reddened, scaly? Tissue is firm; within skin folds proximal to glans with white thick discharge; clear urine dripping noted; non tender to touch, no open wounds or ulcerations Skin:    General: Skin is warm and dry.  Neurological:     Mental Status: He is alert and oriented to person, place, and time.      UC Treatments / Results  Labs (all labs ordered are listed, but only abnormal results are displayed) Labs Reviewed  POCT URINALYSIS DIP (DEVICE) - Abnormal; Notable for the following components:      Result Value   Bilirubin Urine SMALL (*)    Hgb urine dipstick LARGE (*)    Protein, ur >=300 (*)    Leukocytes,Ua LARGE (*)    All other components within normal limits  URINE CULTURE    EKG   Radiology No results found.  Procedures Procedures (including critical care time)  Medications Ordered in UC Medications - No data to display  Initial Impression / Assessment and Plan / UC Course  I have reviewed the triage vital signs and the nursing notes.  Pertinent labs & imaging results that were available during my care of the patient were reviewed by me and considered in my medical decision making (see chart for details).      Very significant and impressive rash and swelling to glans penis. Leaking urine noted, although patient denies incontinence. It does appear that this is likely contributing to fungal and possibly even cellulitic infection to glans. Diflucan for 4 weeks, weekly, as well as keflex provided with topical powder provided as well. Encouraged close follow up for recheck with return precautions discussed. Patient and wife verbalized understanding and agreeable to plan.   Final Clinical Impressions(s) / UC Diagnoses   Final diagnoses:  Penile swelling  Tinea cruris  Lower urinary tract infectious disease     Discharge Instructions     I am concerned about infection as well as fungal infection as source of the swelling and redness.  We will treat for both.  10 days of antibiotics as well as once a week for 4 weeks of antifungal medication. Try to keep skin clean and dry, change out of any moist undergarments promptly.  Use of antifungal powder topical and to skin folds.  Please follow up in the next two weeks with your PCP and/or urologist for recheck   ED Prescriptions    Medication Sig Dispense Auth. Provider   fluconazole (DIFLUCAN) 150 MG tablet Take 1 tablet (150 mg total) by mouth once a week for 4 doses. 4 tablet Augusto Gamble B, NP   cephALEXin (KEFLEX) 500 MG capsule Take 1 capsule (500 mg total) by mouth 4 (four) times daily for 10 days. 40 capsule Augusto Gamble B, NP   miconazole (MICOTIN) 2 % powder Apply topically as needed for itching. And twice a day regularly 70 g Augusto Gamble B, NP     PDMP not reviewed this encounter.   Zigmund Gottron, NP 01/17/19 1359

## 2019-01-18 LAB — URINE CULTURE: Culture: <10000 — AB

## 2023-12-04 ENCOUNTER — Ambulatory Visit (HOSPITAL_COMMUNITY)
Admission: EM | Admit: 2023-12-04 | Discharge: 2023-12-04 | Disposition: A | Discharge status: Home / Self Care | Payer: Self-pay | Attending: Family Medicine | Admitting: Family Medicine

## 2023-12-04 ENCOUNTER — Encounter (HOSPITAL_COMMUNITY): Payer: Self-pay

## 2023-12-04 DIAGNOSIS — N3090 Cystitis, unspecified without hematuria: Secondary | ICD-10-CM

## 2023-12-04 LAB — POCT URINALYSIS DIP (MANUAL ENTRY)
Glucose, UA: NEGATIVE mg/dL
Ketones, POC UA: NEGATIVE mg/dL
Nitrite, UA: NEGATIVE
Protein Ur, POC: >=300 mg/dL — AB
Spec Grav, UA: <=1.005 — AB (ref 1.010–1.025)
Urobilinogen, UA: 0.2 U/dL
pH, UA: 8 (ref 5.0–8.0)

## 2023-12-04 MED ORDER — CEPHALEXIN 500 MG PO CAPS: 500.0000 mg | ORAL_CAPSULE | Freq: Two times a day (BID) | ORAL | 0 refills | Status: DC

## 2023-12-04 NOTE — ED Triage Notes (Signed)
 Patient here today with c/o urinary frequency X 1 year. Patient states that sometimes he urinates in his pants on accident. Denies pain. Patient tried Tamsulosin in the past but it made him pee too much.

## 2023-12-04 NOTE — ED Provider Notes (Signed)
 [facility]    ASSESSMENT & PLAN:  1. Cystitis    Not enough urine to send for culture.  Begin: Meds ordered this encounter  Medications   cephALEXin  (KEFLEX ) 500 MG capsule    Sig: Take 1 capsule (500 mg total) by mouth 2 (two) times daily.    Dispense:  14 capsule    Refill:  0   No signs of pyelonephritis. Will notify patient of any significant results. Ensure proper hydration. Will follow up with his PCP or here if not showing improvement over the next 48 hours, sooner if needed.  Outlined signs and symptoms indicating need for more acute intervention. Patient verbalized understanding. After Visit Summary given.  SUBJECTIVE:  Jeff Parks is a 89 y.o. male who complains of urinary frequency; x 1 year; worse over past few days. Occas incontinence. Ques mild dysuria. Denies fever/n/v. Is ambulatory without assistance. Denies abd/flank pain.  OBJECTIVE:  Vitals:   12/04/23 1104  BP: 115/63  Pulse: 64  Resp: 16  Temp: 97.8 F (36.6 C)  TempSrc: Oral  SpO2: 96%   General appearance: alert; no distress HENT: oropharynx: moist Lungs: unlabored respirations Abdomen: soft, non-tender Back: no CVA tenderness Extremities: no edema; symmetrical with no gross deformities Skin: warm and dry Psychological: alert and cooperative; normal mood and affect  Labs Reviewed  POCT URINALYSIS DIP (MANUAL ENTRY) - Abnormal; Notable for the following components:      Result Value   Clarity, UA turbid (*)    Bilirubin, UA moderate (*)    Spec Grav, UA <=1.005 (*)    Blood, UA large (*)    Protein Ur, POC >=300 (*)    Leukocytes, UA Large (3+) (*)    All other components within normal limits    No Known Allergies  Past Medical History:  Diagnosis Date   Acute encephalopathy    Acute kidney injury (HCC)    Anemia    iron deficiency    Bilateral hydronephrosis    Bladder outlet obstruction    Constipation    Constipation    Dialysis patient (HCC)    hx  of; no longer   Difficulty walking    Foley catheter in place    Hyperthyroidism    Latent syphilis    Leukocytosis    Muscle weakness (generalized)    Pneumonia 2017 feb   hospital acquired    Protein calorie malnutrition (HCC)    Uremic acidosis    Urinary retention    Urinary tract infection    Social History   Socioeconomic History   Marital status: Married    Spouse name: Not on file   Number of children: Not on file   Years of education: Not on file   Highest education level: Not on file  Occupational History   Not on file  Tobacco Use   Smoking status: Some Days    Types: Cigarettes   Smokeless tobacco: Never  Substance and Sexual Activity   Alcohol use: No   Drug use: No   Sexual activity: Never  Other Topics Concern   Not on file  Social History Narrative   Not on file   Social Drivers of Health   Financial Resource Strain: Not on file  Food Insecurity: Not on file  Transportation Needs: Not on file  Physical Activity: Not on file  Stress: Not on file  Social Connections: Not on file  Intimate Partner Violence: Not on file   History reviewed. No pertinent family history.  Rolinda Rogue, MD 12/04/23 [phone]

## 2024-01-23 ENCOUNTER — Emergency Department (HOSPITAL_COMMUNITY)

## 2024-01-23 ENCOUNTER — Encounter (HOSPITAL_COMMUNITY): Payer: Self-pay

## 2024-01-23 ENCOUNTER — Inpatient Hospital Stay (HOSPITAL_COMMUNITY)
Admission: EM | Admit: 2024-01-23 | Discharge: 2024-01-30 | DRG: 811 | Disposition: A | Discharge status: Skilled Nursing Facility | Attending: Family Medicine | Admitting: Family Medicine

## 2024-01-23 ENCOUNTER — Inpatient Hospital Stay (HOSPITAL_COMMUNITY)

## 2024-01-23 ENCOUNTER — Other Ambulatory Visit: Payer: Self-pay

## 2024-01-23 ENCOUNTER — Ambulatory Visit (HOSPITAL_COMMUNITY)
Admission: EM | Admit: 2024-01-23 | Discharge: 2024-01-23 | Disposition: A | Discharge status: Home / Self Care | Payer: Self-pay | Attending: Family Medicine | Admitting: Family Medicine

## 2024-01-23 DIAGNOSIS — N1832 Chronic kidney disease, stage 3b: Secondary | ICD-10-CM | POA: Diagnosis present

## 2024-01-23 DIAGNOSIS — D538 Other specified nutritional anemias: Principal | ICD-10-CM | POA: Diagnosis present

## 2024-01-23 DIAGNOSIS — Z79899 Other long term (current) drug therapy: Secondary | ICD-10-CM | POA: Diagnosis not present

## 2024-01-23 DIAGNOSIS — E0590 Thyrotoxicosis, unspecified without thyrotoxic crisis or storm: Secondary | ICD-10-CM | POA: Diagnosis present

## 2024-01-23 DIAGNOSIS — R310 Gross hematuria: Secondary | ICD-10-CM | POA: Insufficient documentation

## 2024-01-23 DIAGNOSIS — Z681 Body mass index (BMI) 19 or less, adult: Secondary | ICD-10-CM | POA: Diagnosis not present

## 2024-01-23 DIAGNOSIS — E8809 Other disorders of plasma-protein metabolism, not elsewhere classified: Secondary | ICD-10-CM | POA: Diagnosis present

## 2024-01-23 DIAGNOSIS — Z751 Person awaiting admission to adequate facility elsewhere: Secondary | ICD-10-CM | POA: Diagnosis not present

## 2024-01-23 DIAGNOSIS — N136 Pyonephrosis: Secondary | ICD-10-CM | POA: Diagnosis present

## 2024-01-23 DIAGNOSIS — R531 Weakness: Secondary | ICD-10-CM

## 2024-01-23 DIAGNOSIS — F17210 Nicotine dependence, cigarettes, uncomplicated: Secondary | ICD-10-CM | POA: Diagnosis present

## 2024-01-23 DIAGNOSIS — L89150 Pressure ulcer of sacral region, unstageable: Secondary | ICD-10-CM | POA: Diagnosis present

## 2024-01-23 DIAGNOSIS — I959 Hypotension, unspecified: Secondary | ICD-10-CM

## 2024-01-23 DIAGNOSIS — N390 Urinary tract infection, site not specified: Secondary | ICD-10-CM | POA: Diagnosis present

## 2024-01-23 DIAGNOSIS — Z9079 Acquired absence of other genital organ(s): Secondary | ICD-10-CM

## 2024-01-23 DIAGNOSIS — Z789 Other specified health status: Secondary | ICD-10-CM

## 2024-01-23 DIAGNOSIS — S31000A Unspecified open wound of lower back and pelvis without penetration into retroperitoneum, initial encounter: Secondary | ICD-10-CM | POA: Insufficient documentation

## 2024-01-23 DIAGNOSIS — D649 Anemia, unspecified: Secondary | ICD-10-CM | POA: Diagnosis present

## 2024-01-23 DIAGNOSIS — N400 Enlarged prostate without lower urinary tract symptoms: Secondary | ICD-10-CM | POA: Diagnosis present

## 2024-01-23 DIAGNOSIS — E1122 Type 2 diabetes mellitus with diabetic chronic kidney disease: Secondary | ICD-10-CM | POA: Diagnosis present

## 2024-01-23 DIAGNOSIS — E43 Unspecified severe protein-calorie malnutrition: Secondary | ICD-10-CM | POA: Diagnosis present

## 2024-01-23 DIAGNOSIS — N3000 Acute cystitis without hematuria: Principal | ICD-10-CM

## 2024-01-23 DIAGNOSIS — D500 Iron deficiency anemia secondary to blood loss (chronic): Secondary | ICD-10-CM

## 2024-01-23 DIAGNOSIS — E860 Dehydration: Secondary | ICD-10-CM | POA: Diagnosis present

## 2024-01-23 DIAGNOSIS — L8990 Pressure ulcer of unspecified site, unspecified stage: Secondary | ICD-10-CM | POA: Insufficient documentation

## 2024-01-23 DIAGNOSIS — R64 Cachexia: Secondary | ICD-10-CM | POA: Diagnosis present

## 2024-01-23 DIAGNOSIS — R5381 Other malaise: Secondary | ICD-10-CM | POA: Insufficient documentation

## 2024-01-23 DIAGNOSIS — R54 Age-related physical debility: Secondary | ICD-10-CM | POA: Diagnosis present

## 2024-01-23 LAB — CBC WITH DIFFERENTIAL/PLATELET
Abs Immature Granulocytes: 0.11 K/uL — ABNORMAL HIGH (ref 0.00–0.07)
Basophils Absolute: 0 K/uL (ref 0.0–0.1)
Basophils Relative: 0 %
Eosinophils Absolute: 0.1 K/uL (ref 0.0–0.5)
Eosinophils Relative: 1 %
HCT: 21.8 % — ABNORMAL LOW (ref 39.0–52.0)
Hemoglobin: 6.5 g/dL — CL (ref 13.0–17.0)
Immature Granulocytes: 1 %
Lymphocytes Relative: 10 %
Lymphs Abs: 1.7 K/uL (ref 0.7–4.0)
MCH: 26.3 pg (ref 26.0–34.0)
MCHC: 29.8 g/dL — ABNORMAL LOW (ref 30.0–36.0)
MCV: 88.3 fL (ref 80.0–100.0)
Monocytes Absolute: 1.2 K/uL — ABNORMAL HIGH (ref 0.1–1.0)
Monocytes Relative: 7 %
Neutro Abs: 13.7 K/uL — ABNORMAL HIGH (ref 1.7–7.7)
Neutrophils Relative %: 81 %
Platelets: 421 K/uL — ABNORMAL HIGH (ref 150–400)
RBC: 2.47 MIL/uL — ABNORMAL LOW (ref 4.22–5.81)
RDW: 17.2 % — ABNORMAL HIGH (ref 11.5–15.5)
WBC: 16.9 K/uL — ABNORMAL HIGH (ref 4.0–10.5)
nRBC: 0 % (ref 0.0–0.2)

## 2024-01-23 LAB — COMPREHENSIVE METABOLIC PANEL WITH GFR
ALT: 10 U/L (ref 0–44)
AST: 14 U/L — ABNORMAL LOW (ref 15–41)
Albumin: 2.3 g/dL — ABNORMAL LOW (ref 3.5–5.0)
Alkaline Phosphatase: 47 U/L (ref 38–126)
Anion gap: 9 (ref 5–15)
BUN: 33 mg/dL — ABNORMAL HIGH (ref 8–23)
CO2: 18 mmol/L — ABNORMAL LOW (ref 22–32)
Calcium: 8.5 mg/dL — ABNORMAL LOW (ref 8.9–10.3)
Chloride: 107 mmol/L (ref 98–111)
Creatinine, Ser: 1.73 mg/dL — ABNORMAL HIGH (ref 0.61–1.24)
GFR, Estimated: 37 mL/min — ABNORMAL LOW (ref >60)
Glucose, Bld: 105 mg/dL — ABNORMAL HIGH (ref 70–99)
Potassium: 4.9 mmol/L (ref 3.5–5.1)
Sodium: 134 mmol/L — ABNORMAL LOW (ref 135–145)
Total Bilirubin: 0.6 mg/dL (ref 0.0–1.2)
Total Protein: 7.5 g/dL (ref 6.5–8.1)

## 2024-01-23 LAB — RETICULOCYTES
Immature Retic Fract: 16 % — ABNORMAL HIGH (ref 2.3–15.9)
RBC.: 2.73 MIL/uL — ABNORMAL LOW (ref 4.22–5.81)
Retic Count, Absolute: 51.6 K/uL (ref 19.0–186.0)
Retic Ct Pct: 1.9 % (ref 0.4–3.1)

## 2024-01-23 LAB — FOLATE: Folate: 4.5 ng/mL — ABNORMAL LOW (ref >5.9)

## 2024-01-23 LAB — I-STAT CHEM 8, ED
BUN: 36 mg/dL — ABNORMAL HIGH (ref 8–23)
Calcium, Ion: 1.24 mmol/L (ref 1.15–1.40)
Chloride: 109 mmol/L (ref 98–111)
Creatinine, Ser: 1.8 mg/dL — ABNORMAL HIGH (ref 0.61–1.24)
Glucose, Bld: 108 mg/dL — ABNORMAL HIGH (ref 70–99)
HCT: 22 % — ABNORMAL LOW (ref 39.0–52.0)
Hemoglobin: 7.5 g/dL — ABNORMAL LOW (ref 13.0–17.0)
Potassium: 4.7 mmol/L (ref 3.5–5.1)
Sodium: 139 mmol/L (ref 135–145)
TCO2: 20 mmol/L — ABNORMAL LOW (ref 22–32)

## 2024-01-23 LAB — URINALYSIS, W/ REFLEX TO CULTURE (INFECTION SUSPECTED)
Bilirubin Urine: NEGATIVE
Glucose, UA: NEGATIVE mg/dL
Hgb urine dipstick: NEGATIVE
Ketones, ur: NEGATIVE mg/dL
Nitrite: NEGATIVE
Protein, ur: >=300 mg/dL — AB
Specific Gravity, Urine: 1.012 (ref 1.005–1.030)
WBC, UA: >50 WBC/hpf (ref 0–5)
pH: 8 (ref 5.0–8.0)

## 2024-01-23 LAB — FERRITIN: Ferritin: 132 ng/mL (ref 24–336)

## 2024-01-23 LAB — I-STAT CG4 LACTIC ACID, ED: Lactic Acid, Venous: 1.7 mmol/L (ref 0.5–1.9)

## 2024-01-23 LAB — IRON AND TIBC
Iron: 43 ug/dL — ABNORMAL LOW (ref 45–182)
Saturation Ratios: 29 % (ref 17.9–39.5)
TIBC: 147 ug/dL — ABNORMAL LOW (ref 250–450)
UIBC: 104 ug/dL

## 2024-01-23 LAB — POC OCCULT BLOOD, ED: Fecal Occult Bld: NEGATIVE

## 2024-01-23 LAB — VITAMIN B12: Vitamin B-12: 356 pg/mL (ref 180–914)

## 2024-01-23 LAB — MAGNESIUM: Magnesium: 1.7 mg/dL (ref 1.7–2.4)

## 2024-01-23 LAB — CK: Total CK: 48 U/L — ABNORMAL LOW (ref 49–397)

## 2024-01-23 LAB — PREPARE RBC (CROSSMATCH)

## 2024-01-23 LAB — HEMOGLOBIN AND HEMATOCRIT, BLOOD
HCT: 22.8 % — ABNORMAL LOW (ref 39.0–52.0)
Hemoglobin: 7.2 g/dL — ABNORMAL LOW (ref 13.0–17.0)

## 2024-01-23 LAB — TSH: TSH: 1.582 u[IU]/mL (ref 0.350–4.500)

## 2024-01-23 MED ORDER — CEFTRIAXONE SODIUM 2 G IJ SOLR
2.0000 g | Freq: Once | INTRAMUSCULAR | Status: AC
  Administered 2024-01-23: 2 g via INTRAVENOUS
  Filled 2024-01-23: qty 20

## 2024-01-23 MED ORDER — INSULIN ASPART 100 UNIT/ML IJ SOLN
0.0000 [IU] | Freq: Three times a day (TID) | INTRAMUSCULAR | Status: DC
  Administered 2024-01-26: 1 [IU] via SUBCUTANEOUS

## 2024-01-23 MED ORDER — ACETAMINOPHEN 500 MG PO TABS
1000.0000 mg | ORAL_TABLET | Freq: Four times a day (QID) | ORAL | Status: DC | PRN
  Filled 2024-01-23: qty 2

## 2024-01-23 MED ORDER — SODIUM CHLORIDE 0.9 % IV BOLUS
1000.0000 mL | Freq: Once | INTRAVENOUS | Status: AC
  Administered 2024-01-23: 1000 mL via INTRAVENOUS

## 2024-01-23 MED ORDER — SODIUM CHLORIDE 0.9% IV SOLUTION: Freq: Once | INTRAVENOUS | Status: AC

## 2024-01-23 MED ORDER — LACTATED RINGERS IV SOLN
INTRAVENOUS | Status: AC
Start: 2024-01-23 — End: 2024-01-25

## 2024-01-23 NOTE — Assessment & Plan Note (Signed)
 Appears stable without evidence of sepsis at this time with overall reassuring vitals and MAPs >60. Pt with hx of recurrent UTI. UTI most recently diagnosed 7 weeks ago and treated with Keflex . Leukocytosis on CBC to 16.9. Creatinine 1.8 which appears at baseline for him. BPH is likely a contributor to recurrent UTIs, especially as he is unmedicated and previous admissions have documented hydronephrosis. - Continue Ceftriaxone  2 grams daily for now, can likely de-escalate soon - U culture pending for susceptibilities  - blood culture to assess for potential bacteremia, though currently felt to be less likely - Bladder scan, if elevated, consider adding on flomax - AM BMP

## 2024-01-23 NOTE — Assessment & Plan Note (Addendum)
 In the setting of anemia, UTI, malnutrition, and leg weakness. These aspects of his picture likely contributed to his fall. CT head reassuringly without acute abnormality. - mIVF for now given decreased PO intake - CK - RD consult - PT/OT eval and treat

## 2024-01-23 NOTE — ED Triage Notes (Signed)
 Pt bib [facility] from [facility], pt has c.o generalized weakness, has been in the bed for 2 weeks. Pt lives at home with wife. Wife states his brief had pus and blood clots in it this morning. Pt also had a fall 3 days ago

## 2024-01-23 NOTE — ED Notes (Signed)
[facility] notified of need for transport to the ED. 

## 2024-01-23 NOTE — Assessment & Plan Note (Addendum)
 Vitals stable. Hgb 6.5. CBC suggestive of normocytic anemia, but cannot rule out possible multifactorial anemia given concerning history for possible malnutrition and kidney disease. Most likely etiology is GI bleed given patient's age, normocytic presentation, history of possible blood with bowel movements for some time. Interestingly, FOBT negative.  - s/p 1 U RBC transfusion, will need post H&H - Will need Folate, B12, ferritin, TBIC to assess for possible mixed malnutrition or anemia of chronic disease, holding off now due to recent transfusion - CT abdomen pelvis to assess for source of possible bleeding, especially given his history of urinary tract abnormalities - GI consult to further assess bowel cause of bleeding, they will see him likely in AM, appreciate assistance - AM CBC to monitor

## 2024-01-23 NOTE — Assessment & Plan Note (Signed)
 Per report. Patient understandably frustrated with multiple physicians coming to see him today and deferred exam. - Wound care consult

## 2024-01-23 NOTE — Plan of Care (Addendum)
 FMTS Brief Progress Note  S: Evaluated patient at bedside, daughter present. No acute concerns, reports feeling better.   O: BP 100/67 (BP Location: Right Arm)   Pulse 77   Temp (!) 97.4 F (36.3 C) (Oral)   Resp 17   SpO2 100%    General: NAD, chronically-ill appearing and cachectic  Cardio: RRR, no MRG. Respiratory: CTAB, normal wob on RA Skin: Warm and dry  A/P: Anemia UTI - Continue plan per day team H&P 01/23/2024 - Post transfusion H&H 7.2 - Orders reviewed. Labs for AM ordered, which was adjusted as needed.  - If condition changes, plan includes page family medicine teaching service.   Howell Lunger, DO 01/23/2024, 8:34 PM PGY-3, [facility] Night Resident  Please page [phone] with questions.

## 2024-01-23 NOTE — H&P (Addendum)
 Hospital Admission History and Physical Service Pager: [phone]  Patient name: Jeff Parks Medical record number: [id number] Date of Birth: 1930/03/22 Age: 89 y.o. Gender: male  Primary Care Provider: None Consultants: GI Code Status: Full  Preferred Emergency Contact:   Contact Information     Name Relation Home Work Mobile   Proctor Spouse [phone]  [phone]      Other Contacts   None on File    Chief Complaint: Weakness  Differential and Medical Decision Making:  Jeff Parks is a 89 y.o. male presenting with weakness and AMS.  Differential for this patient's presentation of this includes UTI/pyelonephritis, Malnutrition, cardiac arrhythmia, deconditioning, and Anemia.  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 Anemia also likely contributing given low hemoglobin on admission CBC.  Malnutrition is possible given pt's history of limited PO and persistent bedrest and low albumin. Cardiac arrhythmia less likely given no history of heart disease and lack of chest pain or similar symptoms.   Assessment & Plan Anemia Vitals stable. Hgb 6.5. CBC suggestive of normocytic anemia, but cannot rule out possible multifactorial anemia given concerning history for possible malnutrition and kidney disease. Most likely etiology is GI bleed given patient's age, normocytic presentation, history of possible blood with bowel movements for some time. Interestingly, FOBT negative.  - s/p 1 U RBC transfusion, will need post H&H - Will need Folate, B12, ferritin, TBIC to assess for possible mixed malnutrition or anemia of chronic disease, holding off now due to recent transfusion - CT abdomen pelvis to assess for source of possible bleeding, especially given his history of urinary tract abnormalities - GI consult to further assess bowel cause of bleeding, they will see him likely in  AM, appreciate assistance - AM CBC to monitor UTI (urinary tract infection) BPH (benign prostatic hyperplasia) Appears stable without evidence of sepsis at this time with overall reassuring vitals and MAPs >60. Pt with hx of recurrent UTI. UTI most recently diagnosed 7 weeks ago and treated with Keflex . Leukocytosis on CBC to 16.9. Creatinine 1.8 which appears at baseline for him. BPH is likely a contributor to recurrent UTIs, especially as he is unmedicated and previous admissions have documented hydronephrosis. - Continue Ceftriaxone  2 grams daily for now, can likely de-escalate soon - U culture pending for susceptibilities  - blood culture to assess for potential bacteremia, though currently felt to be less likely - Bladder scan, if elevated, consider adding on flomax - AM BMP Physical deconditioning In the setting of anemia, UTI, malnutrition, and leg weakness. These aspects of his picture likely contributed to his fall. CT head reassuringly without acute abnormality. - mIVF for now given decreased PO intake - CK - RD consult - PT/OT eval and treat  Sacral wound, initial encounter Per report. Patient understandably frustrated with multiple physicians coming to see him today and deferred exam. - Wound care consult Chronic health problem CKD- on no medication, appears at baseline T2DM- on no medication, cannot update A1c accurately in the setting of recent transfusion, will add sensitive SSI Hyperthyroidism- on no medication, will update TSH and T4  FEN/GI: Regular diet VTE Prophylaxis: SCDs given active bleed  Disposition: Med-tele  History of Present Illness:  Jeff Parks is a 89 y.o. male presenting with weakness and AMS.  Over the last 2 weeks, he has had difficulty getting out of bed. Prior to this, he was moving slower, and this seems to have  been more gradual. He denies confusion, and his wife denies this as well. He denies dysuria, fevers, headaches, abdominal pain,  congestion. He does feel very cold a lot of the time. He denies fatigue. He insists he feels normal. He has not been eating as well, either, not even a whole meal.  They note an egg-sized blood clot on the floor one week ago, but they are unsure where it came from. He has not noticed any blood in the stool, though his wife notes seeing some pink residue on the toilet seat when he gets up. His urine has appeared more brown, but his wife does not really see any blood in the urine when she changes his incontinent pad.   On Monday, he fell out of the bed, and that was when his wife noticed he had a sacral wound. Wife was able to help him off the floor. She is unsure exactly the mechanism of the fall, however. He has tried to stand up since that time, but he fell back onto the bed.  On chart review, pt was seen for urinary frequency with mild dysuria 12/04/2023. Pt was diagnosed with cystitis and given Keflex  500mg  BID. 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  In the ED, pt had a Hgb of 6.5 WBC 16.9.CT head w/o contrast showed no acute intracranial abnormalities. CT chest showed no active disease. UA shows large amount of Hgb and WBC and moderate leukocytes.  Given ceftriaxone  2g.  Review Of Systems: Per HPI  Pertinent Past Medical History: Hyperthyroidism Hx of bladder outlet obstruction Anemia Hx of hemodialysis (appears to be due to transient need due to renal failure from hydronephrosis) T2DM (self reported) CKD (self reported, likely 2/2 persistent obstruction) Remainder reviewed in history tab.   Pertinent Past Surgical History: Transurethral resection of prostate Open bladder stone removal   Pertinent Social History: Tobacco use: Yes Alcohol use: none Other Substance use: none Lives with wife  Pertinent Family History: Mother: Diabetes Father: Anemia No history of cancer, heart  attack, stroke  Important Outpatient Medications: None  Objective: BP 91/63   Pulse 79   Temp 97.7 F (36.5 C) (Oral)   Resp 16   SpO2 98%  Exam: General: NAD. Cachectic appearing. AO x 3 Cardiovascular: Heart rate and rhythm regular, no m/r/g Respiratory: lungs clear to auscultation bilaterally, speaking comfortably on RA though with intermittent deep inhalations though not true respiratory distress Gastrointestinal: abdomen soft, nontender, nondistended. Bowel sounds normal MSK: diffuse muscle wasting. Strength 3/5 in BLE  Derm: dark Lesion between Great and second toe on L foot without skin breakdown or pain Psych: attentive. Euthymic affect.  Labs:  CBC BMET  Recent Labs  Lab 01/23/24 1044 01/23/24 1047  WBC 16.9*  --   HGB 6.5* 7.5*  HCT 21.8* 22.0*  PLT 421*  --    Recent Labs  Lab 01/23/24 1044 01/23/24 1047  NA 134* 139  K 4.9 4.7  CL 107 109  CO2 18*  --   BUN 33* 36*  CREATININE 1.73* 1.80*  GLUCOSE 105* 108*  CALCIUM  8.5*  --      Urinalysis    Component Value Date/Time   COLORURINE YELLOW 01/23/2024 1128   APPEARANCEUR TURBID (A) 01/23/2024 1128   LABSPEC 1.012 01/23/2024 1128   PHURINE 8.0 01/23/2024 1128   GLUCOSEU NEGATIVE 01/23/2024 1128   HGBUR NEGATIVE 01/23/2024 1128   BILIRUBINUR NEGATIVE 01/23/2024  1128   BILIRUBINUR moderate (A) 12/04/2023 1136   KETONESUR NEGATIVE 01/23/2024 1128   PROTEINUR >=300 (A) 01/23/2024 1128   UROBILINOGEN 0.2 12/04/2023 1136   UROBILINOGEN 1.0 01/17/2019 1230   NITRITE NEGATIVE 01/23/2024 1128   LEUKOCYTESUR MODERATE (A) 01/23/2024 1128   EKG:  Rate 78 Rhythm: Sinus R axis deviation Borderline 1st degree AV block  Imaging Studies Performed:  Imaging Study (ie. Chest x-ray) Impression from Radiologist:  CT HEAD W/O CONTRAST IMPRESSION: 1. No evidence of acute intracranial abnormality. 2. Mild chronic small vessel ischemic disease.   CXR 1 VIEW IMPRESSION: No active disease.   Benjamine Marsa DASEN, Medical Student 01/23/2024, 12:55 PM [facility]  FPTS Intern pager: [phone], text pages welcome Secure chat group [facility] Teaching Service   I agree with the assessment and plan as documented above.  Stuart Redo, MD PGY-3, [facility]

## 2024-01-23 NOTE — Assessment & Plan Note (Deleted)
 Pt with hx of recurrent UTI. UTI diagnosed 7 weeks ago and treated with Keflex . Leukocytosis on CBC to 16.9. Creatinine 1.8. - Continue Ceftriaxone  2 grams  - U culture pending for susceptibilities  - blood culture to assess for bacteremia - AM BMP

## 2024-01-23 NOTE — ED Provider Notes (Signed)
 [facility] AT [facility] Provider Note  CSN: [id number] Arrival date & time: 01/23/24 9060  Chief Complaint(s) Weakness  HPI Jeff Parks is a 89 y.o. male history of UTI presenting to the emergency department with weakness.  Patient lives with family member.  Reports that he has been very weak over the past 2 weeks.  Patient has not been getting out of bed really at all.  Wears briefs.  Has been changed.  Had a fall couple days ago but they do not think he hit his head or suffered any injury.  No fevers, vomiting, diarrhea, black or bloody stools.  They noticed a blood clot on the floor 1 week ago but have not seen any other signs of bleeding and do not know where this came from.  Family member reports that his urine has been very thick and looks like pus.  Was recently treated for UTI.  Patient really denies any complaints other than feeling very weak.  Reports he has not been eating and has not want to.  He has had no appetite.  Initially went to urgent care and was sent here for further evaluation  Past Medical History Past Medical History:  Diagnosis Date   Acute encephalopathy    Acute kidney injury    Anemia    iron deficiency    Bilateral hydronephrosis    Bladder outlet obstruction    Constipation    Constipation    Dialysis patient    hx of; no longer   Difficulty walking    Foley catheter in place    Hyperthyroidism    Latent syphilis    Leukocytosis    Muscle weakness (generalized)    Pneumonia 2017 feb   hospital acquired    Protein calorie malnutrition    Uremic acidosis    Urinary retention    Urinary tract infection    Patient Active Problem List   Diagnosis Date Noted   Benign prostatic hyperplasia with urinary obstruction 07/15/2015   Hydronephrosis, bilateral 06/28/2015   Bladder outlet obstruction 06/28/2015   Acute encephalopathy 06/28/2015   Protein-calorie malnutrition, moderate 06/28/2015   Hyperthyroidism 06/15/2015    Bright red blood per rectum 06/14/2015   Renal failure 06/13/2015   AKI (acute kidney injury) 06/13/2015   Urinary retention 06/13/2015   Anorexia 06/13/2015   Leukocytosis 06/13/2015   Elevated blood pressure 06/13/2015   Anemia 06/13/2015   Weakness    Home Medication(s) Prior to Admission medications   Medication Sig Start Date End Date Taking? Authorizing Provider  cephALEXin  (KEFLEX ) 500 MG capsule Take 1 capsule (500 mg total) by mouth 2 (two) times daily. 12/04/23   Rolinda Rogue, MD  furosemide  (LASIX ) 20 MG tablet Take 20 mg by mouth daily. 09/03/23   [provider]  Past Surgical History Past Surgical History:  Procedure Laterality Date   CYSTOSCOPY WITH LITHOLAPAXY N/A 07/15/2015   Procedure: CYSTOSCOPY, URETEROSCOPY, BILATERAL RETROGRADE PYELOGRAM WITH LITHOLAPAXY, HOLIUM LASER APPLICATION ;  Surgeon: Arlena Gal, MD;  Location: [facility];  Service: Urology;  Laterality: N/A;   HOLMIUM LASER APPLICATION  07/18/2015   Procedure: HOLMIUM LASER APPLICATION;  Surgeon: Arlena Gal, MD;  Location: [facility];  Service: Urology;;   PROSTATECTOMY N/A 07/18/2015   Procedure: OPEN BLADDER STONE REMOVAL,attempted retrograde pyelogram, transurethral resection of prostate;  Surgeon: Arlena Gal, MD;  Location: [facility];  Service: Urology;  Laterality: N/A;   TRANSURETHRAL RESECTION OF PROSTATE N/A 07/15/2015   Procedure: TRANSURETHRAL RESECTION OF THE PROSTATE (TURP);  Surgeon: Arlena Gal, MD;  Location: [facility];  Service: Urology;  Laterality: N/A;   Family History History reviewed. No pertinent family history.  Social History Social History   Tobacco Use   Smoking status: Some Days    Types: Cigarettes   Smokeless tobacco: Never  Vaping Use   Vaping status: Never Used  Substance Use Topics   Alcohol use: No   Drug use: No    Allergies Patient has no known allergies.  Review of Systems Review of Systems  All other systems reviewed and are negative.   Physical Exam Vital Signs  I have reviewed the triage vital signs BP 91/63   Pulse 79   Temp 97.7 F (36.5 C) (Oral)   Resp 16   SpO2 98%  Physical Exam Vitals and nursing note reviewed.  Constitutional:      General: He is not in acute distress.    Appearance: Normal appearance. He is ill-appearing.     Comments: Cachectic appearance   HENT:     Mouth/Throat:     Mouth: Mucous membranes are dry.  Eyes:     Conjunctiva/sclera: Conjunctivae normal.  Cardiovascular:     Rate and Rhythm: Normal rate and regular rhythm.  Pulmonary:     Effort: Pulmonary effort is normal. No respiratory distress.     Breath sounds: Normal breath sounds.  Abdominal:     General: Abdomen is flat.     Palpations: Abdomen is soft.     Tenderness: There is no abdominal tenderness.  Genitourinary:    Comments: Chaperoned by RN, stage II pressure wound to the sacral area.  No other wounds noted Musculoskeletal:     Right lower leg: No edema.     Left lower leg: No edema.  Skin:    General: Skin is warm and dry.     Capillary Refill: Capillary refill takes less than 2 seconds.  Neurological:     Mental Status: He is alert. Mental status is at baseline.     Comments: Oriented to self, situation, place.  Does not know the year.  There is all 4 extremities equally, following commands.  No cranial nerve deficit  Psychiatric:        Mood and Affect: Mood normal.        Behavior: Behavior normal.     ED Results and Treatments Labs (all labs ordered are listed, but only abnormal results are displayed) Labs Reviewed  COMPREHENSIVE METABOLIC PANEL WITH GFR - Abnormal; Notable for the following components:      Result Value   Sodium 134 (*)    CO2 18 (*)    Glucose, Bld 105 (*)    BUN 33 (*)    Creatinine, Ser 1.73 (*)    Calcium  8.5 (*)  Albumin 2.3 (*)     AST 14 (*)    GFR, Estimated 37 (*)    All other components within normal limits  CBC WITH DIFFERENTIAL/PLATELET - Abnormal; Notable for the following components:   WBC 16.9 (*)    RBC 2.47 (*)    Hemoglobin 6.5 (*)    HCT 21.8 (*)    MCHC 29.8 (*)    RDW 17.2 (*)    Platelets 421 (*)    Neutro Abs 13.7 (*)    Monocytes Absolute 1.2 (*)    Abs Immature Granulocytes 0.11 (*)    All other components within normal limits  URINALYSIS, W/ REFLEX TO CULTURE (INFECTION SUSPECTED) - Abnormal; Notable for the following components:   APPearance TURBID (*)    Protein, ur >=300 (*)    Leukocytes,Ua MODERATE (*)    Bacteria, UA MANY (*)    All other components within normal limits  I-STAT CHEM 8, ED - Abnormal; Notable for the following components:   BUN 36 (*)    Creatinine, Ser 1.80 (*)    Glucose, Bld 108 (*)    TCO2 20 (*)    Hemoglobin 7.5 (*)    HCT 22.0 (*)    All other components within normal limits  URINE CULTURE  MAGNESIUM  TSH  I-STAT CG4 LACTIC ACID, ED  POC OCCULT BLOOD, ED  I-STAT CG4 LACTIC ACID, ED  TYPE AND SCREEN  PREPARE RBC (CROSSMATCH)                                                                                                                          Radiology DG Chest Portable 1 View Result Date: 01/23/2024 CLINICAL DATA:  Generalized weakness. EXAM: PORTABLE CHEST 1 VIEW COMPARISON:  06/26/2015 FINDINGS: Lungs are adequately inflated without focal airspace consolidation or effusion. Cardiomediastinal silhouette and remainder of the exam is unchanged. IMPRESSION: No active disease. Electronically Signed   By: Toribio Agreste M.D.   On: 01/23/2024 11:46   CT Head Wo Contrast Result Date: 01/23/2024 CLINICAL DATA:  Mental status change, unknown cause.  Weakness. EXAM: CT HEAD WITHOUT CONTRAST TECHNIQUE: Contiguous axial images were obtained from the base of the skull through the vertex without intravenous contrast. RADIATION DOSE REDUCTION: This exam was  performed according to the departmental dose-optimization program which includes automated exposure control, adjustment of the mA and/or kV according to patient size and/or use of iterative reconstruction technique. COMPARISON:  Head CT 06/19/2015 FINDINGS: Brain: There is no evidence of an acute infarct, intracranial hemorrhage, mass, midline shift, or extra-axial fluid collection. Patchy cerebral white matter hypodensities have progressed and are nonspecific but compatible with mild chronic small vessel ischemic disease. There is progressive, mild-to-moderate cerebral atrophy. Vascular: Calcified atherosclerosis at the skull base. No hyperdense vessel. Skull: No acute fracture or suspicious lesion. Sinuses/Orbits: Visualized paranasal sinuses and mastoid air cells are clear. Unremarkable orbits. Other: None. IMPRESSION: 1. No evidence of acute intracranial abnormality. 2. Mild chronic small vessel ischemic  disease. Electronically Signed   By: Dasie Hamburg M.D.   On: 01/23/2024 11:30    Pertinent labs & imaging results that were available during my care of the patient were reviewed by me and considered in my medical decision making (see MDM for details).  Medications Ordered in ED Medications  cefTRIAXone  (ROCEPHIN ) 2 g in sodium chloride  0.9 % 100 mL IVPB (has no administration in time range)  0.9 %  sodium chloride  infusion (Manually program via Guardrails IV Fluids) (has no administration in time range)  sodium chloride  0.9 % bolus 1,000 mL (1,000 mLs Intravenous New Bag/Given 01/23/24 1034)                                                                                                                                     Procedures .Critical Care  Performed by: Francesca Elsie CROME, MD Authorized by: Francesca Elsie CROME, MD   Critical care provider statement:    Critical care time (minutes):  30   Critical care was necessary to treat or prevent imminent or life-threatening deterioration of the  following conditions:  Circulatory failure   Critical care was time spent personally by me on the following activities:  Development of treatment plan with patient or surrogate, discussions with consultants, evaluation of patient's response to treatment, examination of patient, ordering and review of laboratory studies, ordering and review of radiographic studies, ordering and performing treatments and interventions, pulse oximetry, re-evaluation of patient's condition and review of old charts   Care discussed with: admitting provider     (including critical care time)  Medical Decision Making / ED Course   MDM:  89 year old presenting to the emergency department with weakness.  Patient appears cachectic and somewhat chronically ill.  He also appears dehydrated.  Differential includes infectious process such as UTI, metabolic process such as dehydration or electrolyte derangement, advanced age, failure to thrive.  Will obtain workup and reassess.  Will obtain CT head as family member reports he is somewhat confused.  Clinical Course as of 01/23/24 1257  Thu Jan 23, 2024  1256 Workup for UTI.  Patient also with anemia.  Previous labs are from 8 years ago but seems somewhat chronic based on prior labs.  Labs also with leukocytosis.  Occult stool testing is negative for bleed.  Family consented for blood.  Patient will need admission.  Discussed with the internal medicine teaching service who will admit patient for further management of his weakness. [WS]    Clinical Course User Index [WS] Francesca Elsie CROME, MD     Additional history obtained: -Additional history obtained from ems -External records from outside source obtained and reviewed including: Chart review including previous notes, labs, imaging, consultation notes including prior notes    Lab Tests: -I ordered, reviewed, and interpreted labs.   The pertinent results include:   Labs Reviewed  COMPREHENSIVE METABOLIC PANEL  WITH GFR - Abnormal; Notable for the  following components:      Result Value   Sodium 134 (*)    CO2 18 (*)    Glucose, Bld 105 (*)    BUN 33 (*)    Creatinine, Ser 1.73 (*)    Calcium  8.5 (*)    Albumin 2.3 (*)    AST 14 (*)    GFR, Estimated 37 (*)    All other components within normal limits  CBC WITH DIFFERENTIAL/PLATELET - Abnormal; Notable for the following components:   WBC 16.9 (*)    RBC 2.47 (*)    Hemoglobin 6.5 (*)    HCT 21.8 (*)    MCHC 29.8 (*)    RDW 17.2 (*)    Platelets 421 (*)    Neutro Abs 13.7 (*)    Monocytes Absolute 1.2 (*)    Abs Immature Granulocytes 0.11 (*)    All other components within normal limits  URINALYSIS, W/ REFLEX TO CULTURE (INFECTION SUSPECTED) - Abnormal; Notable for the following components:   APPearance TURBID (*)    Protein, ur >=300 (*)    Leukocytes,Ua MODERATE (*)    Bacteria, UA MANY (*)    All other components within normal limits  I-STAT CHEM 8, ED - Abnormal; Notable for the following components:   BUN 36 (*)    Creatinine, Ser 1.80 (*)    Glucose, Bld 108 (*)    TCO2 20 (*)    Hemoglobin 7.5 (*)    HCT 22.0 (*)    All other components within normal limits  URINE CULTURE  MAGNESIUM  TSH  I-STAT CG4 LACTIC ACID, ED  POC OCCULT BLOOD, ED  I-STAT CG4 LACTIC ACID, ED  TYPE AND SCREEN  PREPARE RBC (CROSSMATCH)    Notable for anemia, UTI, CKD vs AKI   EKG   EKG Interpretation Date/Time:  Thursday January 23 2024 09:58:22 EDT Ventricular Rate:  73 PR Interval:  232 QRS Duration:  83 QT Interval:  376 QTC Calculation: 415 R Axis:   118  Text Interpretation: Sinus or ectopic atrial rhythm Prolonged PR interval Right axis deviation Confirmed by Francesca Fallow ([id number]) on 01/23/2024 10:15:30 AM         Imaging Studies ordered: I ordered imaging studies including CT head, CXR On my interpretation imaging demonstrates no acute process I independently visualized and interpreted imaging. I agree with the  radiologist interpretation   Medicines ordered and prescription drug management: Meds ordered this encounter  Medications   sodium chloride  0.9 % bolus 1,000 mL   cefTRIAXone  (ROCEPHIN ) 2 g in sodium chloride  0.9 % 100 mL IVPB    Antibiotic Indication::   UTI   0.9 %  sodium chloride  infusion (Manually program via Guardrails IV Fluids)    -I have reviewed the patients home medicines and have made adjustments as needed   Consultations Obtained: I requested consultation with the family medicine team ,  and discussed lab and imaging findings as well as pertinent plan - they recommend: admission   Cardiac Monitoring: The patient was maintained on a cardiac monitor.  I personally viewed and interpreted the cardiac monitored which showed an underlying rhythm of: NSR     Reevaluation: After the interventions noted above, I reevaluated the patient and found that their symptoms have improved  Co morbidities that complicate the patient evaluation  Past Medical History:  Diagnosis Date   Acute encephalopathy    Acute kidney injury    Anemia    iron deficiency    Bilateral hydronephrosis  Bladder outlet obstruction    Constipation    Constipation    Dialysis patient    hx of; no longer   Difficulty walking    Foley catheter in place    Hyperthyroidism    Latent syphilis    Leukocytosis    Muscle weakness (generalized)    Pneumonia 2017 feb   hospital acquired    Protein calorie malnutrition    Uremic acidosis    Urinary retention    Urinary tract infection       Dispostion: Disposition decision including need for hospitalization was considered, and patient admitted to the hospital.    Final Clinical Impression(s) / ED Diagnoses Final diagnoses:  Acute cystitis without hematuria  Symptomatic anemia  Generalized weakness  Dehydration     This chart was dictated using voice recognition software.  Despite best efforts to proofread,  errors can occur which can  change the documentation meaning.    Francesca Elsie CROME, MD 01/23/24 [phone]

## 2024-01-23 NOTE — ED Provider Notes (Signed)
 [facility]    CSN: [id number] Arrival date & time: 01/23/24  9147      History   Chief Complaint Chief Complaint  Patient presents with   Weakness    HPI Winifred Balogh is a 89 y.o. male.    Weakness  Patient is here with his niece today, who he lives with.  She states he has not been out of bed since 9/11.  She lives with him but has not seen him. He does have a small bathroom near the bed, which maybe he has gotten up to go to occasionally, but she is not sure.  He wears depends, and noted pus in the diaper.  She also noted a large blood clot on the floor, and unclear as to where that came from.  She states he is slightly confused, not at his base line.  He is unable to ambulate well, and almost fell in the waiting room.  He was treated here last month for cystitis.  He denies any fever/chills.  No URI symptoms.  He states he feels pretty well today, but still feels fatigued and weak.    Past Medical History:  Diagnosis Date   Acute encephalopathy    Acute kidney injury    Anemia    iron deficiency    Bilateral hydronephrosis    Bladder outlet obstruction    Constipation    Constipation    Dialysis patient    hx of; no longer   Difficulty walking    Foley catheter in place    Hyperthyroidism    Latent syphilis    Leukocytosis    Muscle weakness (generalized)    Pneumonia 2017 feb   hospital acquired    Protein calorie malnutrition    Uremic acidosis    Urinary retention    Urinary tract infection     Patient Active Problem List   Diagnosis Date Noted   Benign prostatic hyperplasia with urinary obstruction 07/15/2015   Hydronephrosis, bilateral 06/28/2015   Bladder outlet obstruction 06/28/2015   Acute encephalopathy 06/28/2015   Protein-calorie malnutrition, moderate 06/28/2015   Hyperthyroidism 06/15/2015   Bright red blood per rectum 06/14/2015   Renal failure 06/13/2015   AKI (acute kidney injury) 06/13/2015   Urinary retention  06/13/2015   Anorexia 06/13/2015   Leukocytosis 06/13/2015   Elevated blood pressure 06/13/2015   Anemia 06/13/2015   Weakness     Past Surgical History:  Procedure Laterality Date   CYSTOSCOPY WITH LITHOLAPAXY N/A 07/15/2015   Procedure: CYSTOSCOPY, URETEROSCOPY, BILATERAL RETROGRADE PYELOGRAM WITH LITHOLAPAXY, HOLIUM LASER APPLICATION ;  Surgeon: Arlena Gal, MD;  Location: [facility];  Service: Urology;  Laterality: N/A;   HOLMIUM LASER APPLICATION  07/18/2015   Procedure: HOLMIUM LASER APPLICATION;  Surgeon: Arlena Gal, MD;  Location: [facility];  Service: Urology;;   PROSTATECTOMY N/A 07/18/2015   Procedure: OPEN BLADDER STONE REMOVAL,attempted retrograde pyelogram, transurethral resection of prostate;  Surgeon: Arlena Gal, MD;  Location: [facility];  Service: Urology;  Laterality: N/A;   TRANSURETHRAL RESECTION OF PROSTATE N/A 07/15/2015   Procedure: TRANSURETHRAL RESECTION OF THE PROSTATE (TURP);  Surgeon: Arlena Gal, MD;  Location: [facility];  Service: Urology;  Laterality: N/A;       Home Medications    Prior to Admission medications   Medication Sig Start Date End Date Taking? Authorizing Provider  cephALEXin  (KEFLEX ) 500 MG capsule Take 1 capsule (500 mg total) by mouth 2 (two) times daily. 12/04/23   Rolinda Rogue, MD  Family History History reviewed. No pertinent family history.  Social History Social History   Tobacco Use   Smoking status: Some Days    Types: Cigarettes   Smokeless tobacco: Never  Vaping Use   Vaping status: Never Used  Substance Use Topics   Alcohol use: No   Drug use: No     Allergies   Patient has no known allergies.   Review of Systems Review of Systems  Constitutional:  Positive for fatigue.  HENT: Negative.    Respiratory: Negative.    Cardiovascular: Negative.   Gastrointestinal: Negative.   Musculoskeletal: Negative.   Neurological:  Positive for weakness.  Psychiatric/Behavioral: Negative.        Physical Exam Triage Vital Signs ED Triage Vitals  Encounter Vitals Group     BP 01/23/24 0905 (!) 101/50     Girls Systolic BP Percentile --      Girls Diastolic BP Percentile --      Boys Systolic BP Percentile --      Boys Diastolic BP Percentile --      Pulse Rate 01/23/24 0905 87     Resp 01/23/24 0905 18     Temp 01/23/24 0905 97.6 F (36.4 C)     Temp Source 01/23/24 0905 Oral     SpO2 01/23/24 0905 100 %     Weight --      Height --      Head Circumference --      Peak Flow --      Pain Score 01/23/24 0907 0     Pain Loc --      Pain Education --      Exclude from Growth Chart --    No data found.  Updated Vital Signs BP (!) 101/50 (BP Location: Right Arm)   Pulse 87   Temp 97.6 F (36.4 C) (Oral)   Resp 18   SpO2 100%   Visual Acuity Right Eye Distance:   Left Eye Distance:   Bilateral Distance:    Right Eye Near:   Left Eye Near:    Bilateral Near:     Physical Exam Constitutional:      General: He is not in acute distress.    Appearance: He is not ill-appearing.     Comments: Sitting in a wheelchair, appears comfortable  Neurological:     Mental Status: He is alert.      UC Treatments / Results  Labs (all labs ordered are listed, but only abnormal results are displayed) Labs Reviewed - No data to display  EKG   Radiology No results found.  Procedures Procedures (including critical care time)  Medications Ordered in UC Medications - No data to display  Initial Impression / Assessment and Plan / UC Course  I have reviewed the triage vital signs and the nursing notes.  Pertinent labs & imaging results that were available during my care of the patient were reviewed by me and considered in my medical decision making (see chart for details).  Patient is here for weakness x 2 weeks.  Niece believes he has been in bed for the last 2 weeks due to weakness.  She noted pus in his depends, and a blood clot on the floor.   He overall  appears well, but he is weak, slightly confused and hypotensive.  Given the risk for rhabdo, as well as the underlying cause for his symptoms, I recommend he go to the ER for further evaluation.  [facility] was called  for transport given his risk for fall.    Final Clinical Impressions(s) / UC Diagnoses   Final diagnoses:  Weakness  Hypotension, unspecified hypotension type   Discharge Instructions   None    ED Prescriptions   None    PDMP not reviewed this encounter.   Darral Longs, MD 01/23/24 [phone]

## 2024-01-23 NOTE — ED Notes (Signed)
 [place], RN/[facility] charge notified of transport tot he ED.

## 2024-01-23 NOTE — Discharge Instructions (Signed)
 You will be transferred to the ER via [facility] today.

## 2024-01-23 NOTE — Hospital Course (Addendum)
 Jeff Parks is a 89 y.o. male with a past history of hyperthyroidism, hx of bladder outlet obstruction, T2DM, CKD, hx of hydronephrosis who was admitted to the [facility] Medicine Teaching Service at [facility] for symptomatic anemia. Hospital course is outlined below by problem.   Symptomatic anemia Altered mental status Fall Presented with weakness and hemoglobin of 6.5 in the setting of reported pus and blood in his adult brief. CT head with no acute abnormality. FOBT negative for bleed. 2 units of pRBC was transfused. At time of discharge, patient was asymptomatic and hemoglobin was stable.  UTI BPH Gross hematuria On admission, patient found to have pus and blood in his adult brief as reported above as well as thick and foul-smelling urine. UA suggestive of UTI, started on IV Ceftriaxone . CTAP with moderate right and left hydronephrosis, likely secondary to BPH. UC no growth, completed empiric antibiotic course.  Mass in bladder CTAP significant for 2.1 x 2.2 x 4.8 cm lobulated mural mass involving the left posterolateral base of the bladder, suspicious for primary urothelial malignancy. Urology was consulted, and they recommended outpatient follow-up for further evaluation including cystoscopy and biopsy of mass.  Severe malnutrition Hypoalbuminemia Likely occurred in the setting of decreased p.o. intake with UTI and overall chronic illness and chronic protein calorie malnutrition. Nutrition was consulted and recommended Ensure twice daily, daily multivitamin, iron and folic acid  supplementation.  Sacral wound Wound care consulted. Recommended daily cleanse with NS, application of 1/4 thick layer of leptospermum honey to wound bed, top with dry dressing and silicone foam, change daily.   Other conditions that were chronic and stable: CKD, T2DM, Hyperthyroidism  Issues for follow up: Follow up with urology outpatient for cystoscopy and procedural workup Order anemia studies given transfused in  the hospital

## 2024-01-23 NOTE — ED Triage Notes (Signed)
 Patient's wife reports that the patient has been in the bed since 01/09/24. Wife reports that the patient wears Depends and saw pus and blood clots  in the Depends. Patient smells of urine. Wife reports that the patient had a UTI 6 weeks ago.   Wife also reports that the patient had a fall 3 days ago.

## 2024-01-23 NOTE — Assessment & Plan Note (Addendum)
 CKD- on no medication, appears at baseline T2DM- on no medication, cannot update A1c accurately in the setting of recent transfusion, will add sensitive SSI Hyperthyroidism- on no medication, will update TSH and T4

## 2024-01-24 DIAGNOSIS — E43 Unspecified severe protein-calorie malnutrition: Secondary | ICD-10-CM | POA: Insufficient documentation

## 2024-01-24 DIAGNOSIS — L8990 Pressure ulcer of unspecified site, unspecified stage: Secondary | ICD-10-CM | POA: Insufficient documentation

## 2024-01-24 LAB — GLUCOSE, CAPILLARY
Glucose-Capillary: 74 mg/dL (ref 70–99)
Glucose-Capillary: 92 mg/dL (ref 70–99)
Glucose-Capillary: 121 mg/dL — ABNORMAL HIGH (ref 70–99)

## 2024-01-24 LAB — URINE CULTURE

## 2024-01-24 LAB — PREPARE RBC (CROSSMATCH)

## 2024-01-24 MED ORDER — ADULT MULTIVITAMIN W/MINERALS CH
1.0000 | ORAL_TABLET | Freq: Every day | ORAL | Status: DC
  Administered 2024-01-24 – 2024-01-30 (×7): 1 via ORAL
  Filled 2024-01-24 (×7): qty 1

## 2024-01-24 MED ORDER — MEDIHONEY WOUND/BURN DRESSING EX PSTE
1.0000 | PASTE | Freq: Every day | CUTANEOUS | Status: DC
Start: 2024-01-24 — End: 2024-01-30
  Administered 2024-01-24 – 2024-01-30 (×7): 1 via TOPICAL
  Filled 2024-01-24 (×2): qty 44

## 2024-01-24 MED ORDER — SODIUM CHLORIDE 0.9 % IV SOLN
2.0000 g | INTRAVENOUS | Status: DC
  Administered 2024-01-24 – 2024-01-26 (×3): 2 g via INTRAVENOUS
  Filled 2024-01-24 (×3): qty 20

## 2024-01-24 MED ORDER — FOLIC ACID 1 MG PO TABS
1.0000 mg | ORAL_TABLET | Freq: Every day | ORAL | Status: DC
  Administered 2024-01-24 – 2024-01-30 (×7): 1 mg via ORAL
  Filled 2024-01-24 (×7): qty 1

## 2024-01-24 MED ORDER — SODIUM CHLORIDE 0.9% IV SOLUTION
Freq: Once | INTRAVENOUS | Status: DC
Start: 2024-01-24 — End: 2024-01-24

## 2024-01-24 MED ORDER — ENSURE PLUS HIGH PROTEIN PO LIQD
237.0000 mL | Freq: Two times a day (BID) | ORAL | Status: DC
  Administered 2024-01-25 – 2024-01-30 (×10): 237 mL via ORAL

## 2024-01-24 MED ORDER — FERROUS SULFATE 325 (65 FE) MG PO TABS
325.0000 mg | ORAL_TABLET | Freq: Every day | ORAL | Status: DC
  Administered 2024-01-25 – 2024-01-30 (×6): 325 mg via ORAL
  Filled 2024-01-24 (×6): qty 1

## 2024-01-24 MED ORDER — SODIUM CHLORIDE 0.9% IV SOLUTION: Freq: Once | INTRAVENOUS | Status: AC

## 2024-01-24 NOTE — Evaluation (Signed)
 Physical Therapy Evaluation Patient Details Name: Jeff Parks MRN: [id number] DOB: [birth year] Today's Date: 01/24/2024  History of Present Illness  Pt is a 89 y/o M admitted on 01/23/24 after presenting with c/o weakness & AMS, endorses fall 3 days prior. Pt is being treated for anemia, UTI. PMH: AKI, anemia, hydronephrosis, constipation, foley catheter, hyperthyroidism, latent syphilis, UTI  Clinical Impression  Pt seen for PT evaluation with pt agreeable, daughter present & assisting with providing PLOF, home set up information. Pt presents with confusion that is not baseline, requiring multimodal cuing throughout session. Pt is able to ambulate around bed with pt electing to hold to IV pole for balance. Provided pt with RW & pt able to ambulate into hallway with RW & min assist with cuing & heavy assistance for RW management. At this time, pt will likely mobilize better with HHA than RW as RW is new and more confusing to pt. Will continue to follow pt acutely to progress mobility & reduce fall risk as able.      If plan is discharge home, recommend the following: Assistance with cooking/housework;Assistance with feeding;A lot of help with walking and/or transfers;A lot of help with bathing/dressing/bathroom;Direct supervision/assist for financial management;Supervision due to cognitive status;Direct supervision/assist for medications management;Help with stairs or ramp for entrance;Assist for transportation   Can travel by private vehicle   Yes    Equipment Recommendations BSC/3in1;Rolling walker (2 wheels)  Recommendations for Other Services       Functional Status Assessment Patient has had a recent decline in their functional status and demonstrates the ability to make significant improvements in function in a reasonable and predictable amount of time.     Precautions / Restrictions Precautions Precautions: Fall Restrictions Weight Bearing Restrictions Per Provider Order: No       Mobility  Bed Mobility Overal bed mobility: Needs Assistance Bed Mobility: Supine to Sit     Supine to sit: Supervision, HOB elevated, Used rails (exited L side of bed)          Transfers Overall transfer level: Needs assistance Equipment used: None, Rolling walker (2 wheels) Transfers: Sit to/from Stand Sit to Stand: Contact guard assist           General transfer comment: sit>stand from EOB without AD with CGA, sit>stand from recliner with RW with cuing re: hand placement to push to standing    Ambulation/Gait Ambulation/Gait assistance: Min assist Gait Distance (Feet): 10 Feet (+ 30 ft) Assistive device: IV Pole, Rolling walker (2 wheels) Gait Pattern/deviations: Decreased step length - right, Decreased step length - left, Decreased dorsiflexion - right, Decreased dorsiflexion - left, Decreased stride length Gait velocity: decreased     General Gait Details: Pt ambulated around bed to recliner with pt reaching for IV pole for support, min assist for balance. Provided pt with RW & pt ambulated into hallway with RW & min assist with heavy cuing/assistance for RW management (obstacle avoidance, ambulating within base of AD, steering).  Stairs            Wheelchair Mobility     Tilt Bed    Modified Rankin (Stroke Patients Only)       Balance Overall balance assessment: Needs assistance Sitting-balance support: Feet supported Sitting balance-Leahy Scale: Fair     Standing balance support: During functional activity, Reliant on assistive device for balance, Single extremity supported Standing balance-Leahy Scale: Poor  Pertinent Vitals/Pain Pain Assessment Pain Assessment: No/denies pain    Home Living Family/patient expects to be discharged to:: Private residence Living Arrangements: Spouse/significant other Available Help at Discharge: Family;Available 24 hours/day Type of Home: House Home Access:  Stairs to enter Entrance Stairs-Rails: Doctor, general practice of Steps: 3   Home Layout: One level Home Equipment: None Additional Comments: Wife ambulates with cane herself. Son assists as able but he works.    Prior Function Prior Level of Function : Driving             Mobility Comments: at least 2 falls in the past 6 months, driving up until 2 weeks ago but since then pt with minimal OOB activity (assistance for bed>chair transfers) ADLs Comments: dressing without assistance, not bathing much but wouldn't allow family to really assist, was likely sponge bathing though     Extremity/Trunk Assessment   Upper Extremity Assessment Upper Extremity Assessment: Generalized weakness    Lower Extremity Assessment Lower Extremity Assessment: Generalized weakness       Communication   Communication Communication: No apparent difficulties    Cognition Arousal: Alert Behavior During Therapy: WFL for tasks assessed/performed   PT - Cognitive impairments: Orientation, Awareness, Memory, Problem solving, Safety/Judgement   Orientation impairments: Place, Time (oriented to [facility] & able to correctly choose hopsital from choice of 2)                     Following commands: Impaired Following commands impaired: Follows one step commands with increased time     Cueing Cueing Techniques: Tactile cues, Verbal cues, Gestural cues, Visual cues     General Comments      Exercises     Assessment/Plan    PT Assessment Patient needs continued PT services  PT Problem List Decreased strength;Decreased coordination;Decreased range of motion;Decreased cognition;Decreased activity tolerance;Decreased balance;Decreased mobility;Decreased safety awareness;Decreased knowledge of use of DME       PT Treatment Interventions DME instruction;Balance training;Gait training;Neuromuscular re-education;Stair training;Functional mobility training;Therapeutic  activities;Therapeutic exercise;Patient/family education    PT Goals (Current goals can be found in the Care Plan section)  Acute Rehab PT Goals Patient Stated Goal: get better PT Goal Formulation: With patient/family Time For Goal Achievement: 02/07/24 Potential to Achieve Goals: Good    Frequency Min 3X/week     Co-evaluation               AM-PAC PT 6 Clicks Mobility  Outcome Measure Help needed turning from your back to your side while in a flat bed without using bedrails?: None Help needed moving from lying on your back to sitting on the side of a flat bed without using bedrails?: A Little Help needed moving to and from a bed to a chair (including a wheelchair)?: A Little Help needed standing up from a chair using your arms (e.g., wheelchair or bedside chair)?: A Little Help needed to walk in hospital room?: A Little Help needed climbing 3-5 steps with a railing? : A Lot 6 Click Score: 18    End of Session   Activity Tolerance: Patient tolerated treatment well Patient left: in chair;with call bell/phone within reach;with nursing/sitter in room Nurse Communication: Mobility status PT Visit Diagnosis: Unsteadiness on feet (R26.81);Muscle weakness (generalized) (M62.81);Other abnormalities of gait and mobility (R26.89);Difficulty in walking, not elsewhere classified (R26.2)    Time: 8959-8941 PT Time Calculation (min) (ACUTE ONLY): 18 min   Charges:   PT Evaluation $PT Eval Low Complexity: 1 Low   PT General Charges $$  ACUTE PT VISIT: 1 Visit         Richerd Pinal, PT, DPT 01/24/24, 11:14 AM   Richerd CHRISTELLA Pinal 01/24/2024, 11:14 AM

## 2024-01-24 NOTE — Assessment & Plan Note (Addendum)
 CTAP with moderate right and left hydronephrosis, likely 2/2 BPH. CTAP significant for 2.1 x 2.2 x 4.8 cm lobulated mural mass involving the left posterolateral base of the bladder, suspicious for primary urothelial malignancy. Has hx of recurrent UTI (recently diagnosed 7 weeks ago and treated with Keflex ). Cr at baseline.  - Urology consulted, recommended outpatient follow-up for further evaluation including cystoscopy and biopsy of mass - Continue Ceftriaxone  2 g daily for UTI - UCx reordered and pending - Blood Cx: NGTD - Q8H bladder scans - AM BMP

## 2024-01-24 NOTE — Assessment & Plan Note (Signed)
 CKD- no home meds, appears at baseline T2DM- no home meds, cannot update A1c accurately in the setting of recent transfusion; added sensitive SSI Hyperthyroidism- no home meds; TSH wnl

## 2024-01-24 NOTE — Plan of Care (Signed)
 Consulted with Ole Bourdon, NP from Urology regarding CTAP imaging findings of posterolateral mass at the base of bladder. Appreciate his assistance. He advised that patient should follow-up with outpatient urology for further evaluation and management of mass. Stated that patient will likely need cytoscopy and procedural workup which are performed on an outpatient basis.

## 2024-01-24 NOTE — Consult Note (Signed)
 WOC Nurse Consult Note: Reason for Consult: sacral wound Wound type: Unstageable pressure injury to lower back (lumbar spine) Pressure Injury POA: Yes Measurement: 2.5 cm x 2 cm  Wound bed: 70% yellow slough, 30% pink Drainage (amount, consistency, odor) serous Periwound: intact  Dressing procedure/placement/frequency:   Cleanse with NS, pat dry.  Apply 1/4 thick layer of leptospermum honey to wound bed,  top with dry dressing and silicone foam, change daily.    WOC team will not follow patient at this time.  Please re consult if new needs arise.   Thank you, Doyal Polite, MSN, RN, [facility] WOC Team [phone] (Available Mon-Fri 0700-1500)

## 2024-01-24 NOTE — Assessment & Plan Note (Addendum)
 VSS. Hgb 7.2 s/p 1 U pRBC transfusion. Low folate and iron. - AM CBC - Folate and iron supplementation - 1 unit pRBC today, post transfusion H&H

## 2024-01-24 NOTE — Progress Notes (Addendum)
 Pt just finished with 1 unit of blood. No reaction was noted. BP still soft at 80/48. Repeat BP of  88/54, pt remains asymptomatic. MD was notified of the lower BP, and inquiring for the need to continue IV fluid.  01/24/24 1528  Assess: MEWS Score  Temp 98.2 F (36.8 C)  BP (!) 80/48  MAP (mmHg) (!) 59  Pulse Rate 67  ECG Heart Rate 66  SpO2 100 %  O2 Device Room Air  Assess: MEWS Score  MEWS Temp 0  MEWS Systolic 2  MEWS Pulse 0  MEWS RR 0  MEWS LOC 0  MEWS Score 2  MEWS Score Color Yellow  Assess: if the MEWS score is Yellow or Red  Were vital signs accurate and taken at a resting state? Yes  Does the patient meet 2 or more of the SIRS criteria? No  Assess: SIRS CRITERIA  SIRS Temperature  0  SIRS Respirations  0  SIRS Pulse 0  SIRS WBC 0  SIRS Score Sum  0

## 2024-01-24 NOTE — Assessment & Plan Note (Signed)
 Wound care consulted

## 2024-01-24 NOTE — Assessment & Plan Note (Addendum)
 Patient has been largely sedentary, confined to bed for last two weeks (likely related to malnutrition, leg weakness, UTI, and anemia). Likely contributor to recent fall. CK normal. - mIVF as indicated - RD consulted - PT/OT eval and treat

## 2024-01-24 NOTE — TOC Initial Note (Addendum)
 Transition of Care [facility]) - Initial/Assessment Note    Patient Details  Name: Jeff Parks MRN: [id number] Date of Birth: Sep 17, 1929  [facility] CM/SW Contact:    Jeff Parks, LCSWA Phone Number: 01/24/2024, 12:43 PM  Clinical Narrative:  Pt is from home with spouse Jeff Parks. Pt is disoriented x2.  CSW spoke to pt's wife Jeff Parks via phone. CSW informed Jeff Parks of PT recs of a SNF. Jeff Parks stated that the pt has previously been to a SNF previously and didn't like it and was ready to go home. Jeff Parks inquired about HH and CSW stated that their insurance does not cover HH but covers SNF.   Jeff Parks inquired about long term care and CSW explained that their insurance would not cover LTC and they would need a LTC insurance. CSW stated they could inquire about a medicaid application with one the financial navigators at [facility]. Jeff Parks was agreeable and request caregiver resources.  CSW spoke the daughter Jeff Parks and explained the information above. Jeff Parks stated that she has had previous conversations with the pt about a SNF and the pt declines and has hx of noncompliance.  CSW will follow up with financial navigator.  Expected Discharge Plan: Skilled Nursing Facility Barriers to Discharge: Continued Medical Work up, Family Issues, Other (must enter comment) (SNF vs Home)   Patient Goals and CMS Choice   CMS Medicare.gov Compare Post Acute Care list provided to:: Patient Represenative (must comment) (Wife) Choice offered to / list presented to : Spouse      Expected Discharge Plan and Services In-house Referral: Clinical Social Work     Living arrangements for the past 2 months: Single Family Home                                      Prior Living Arrangements/Services Living arrangements for the past 2 months: Single Family Home Lives with:: Spouse Patient language and need for interpreter reviewed:: Yes Do you feel safe going back to the place where you  live?: Yes      Need for Family Participation in Patient Care: Yes (Comment) Care giver support system in place?: Yes (comment)   Criminal Activity/Legal Involvement Pertinent to Current Situation/Hospitalization: No - Comment as needed  Activities of Daily Living   ADL Screening (condition at time of admission) Independently performs ADLs?: Yes (appropriate for developmental age)  Permission Sought/Granted Permission sought to share information with : Family Supports Permission granted to share information with : No (pt disoriented x2)  Share Information with NAME: Jeff Parks     Permission granted to share info w Relationship: Wife  Permission granted to share info w Contact Information: [phone]  Emotional Assessment Appearance:: Appears younger than stated age Attitude/Demeanor/Rapport: Unable to Assess Affect (typically observed): Unable to Assess Orientation: : Oriented to  Time, Oriented to Situation Alcohol / Substance Use: Not Applicable Psych Involvement: No (comment)  Admission diagnosis:  Dehydration [E86.0] Acute cystitis without hematuria [N30.00] Generalized weakness [R53.1] Symptomatic anemia [D64.9] Patient Active Problem List   Diagnosis Date Noted   Chronic health problem 01/23/2024   Physical deconditioning 01/23/2024   Sacral wound, initial encounter 01/23/2024   BPH (benign prostatic hyperplasia) 01/23/2024   Benign prostatic hyperplasia with urinary obstruction 07/15/2015   Hydronephrosis, bilateral 06/28/2015   Bladder outlet obstruction 06/28/2015   Acute encephalopathy 06/28/2015   Protein-calorie malnutrition, moderate 06/28/2015   UTI (urinary tract infection) 06/15/2015  Hyperthyroidism 06/15/2015   Bright red blood per rectum 06/14/2015   Renal failure 06/13/2015   AKI (acute kidney injury) 06/13/2015   Urinary retention 06/13/2015   Anorexia 06/13/2015   Leukocytosis 06/13/2015   Elevated blood pressure 06/13/2015   Anemia  06/13/2015   Weakness    PCP:  Patient, No Pcp Per Pharmacy:   [id number] [place], [place] - [address] [place] [address] Phone: [phone] Fax: [phone]     Social Drivers of Health (SDOH) Social History: SDOH Screenings   Food Insecurity: No Food Insecurity (01/23/2024)  Housing: Low Risk  (01/23/2024)  Transportation Needs: No Transportation Needs (01/23/2024)  Utilities: Not At Risk (01/23/2024)  Social Connections: Moderately Isolated (01/23/2024)  Tobacco Use: High Risk (01/23/2024)   SDOH Interventions:     Readmission Risk Interventions     No data to display

## 2024-01-24 NOTE — Evaluation (Signed)
 Occupational Therapy Evaluation Patient Details Name: Jeff Parks MRN: [id number] DOB: 1929/08/11 Today's Date: 01/24/2024   History of Present Illness   Pt is a 89 y/o M admitted on 01/23/24 after presenting with c/o weakness & AMS, endorses fall 3 days prior. Pt is being treated for anemia, UTI. PMH: AKI, anemia, hydronephrosis, constipation, foley catheter, hyperthyroidism, latent syphilis, UTI     Clinical Impressions [name] admitted with above presents to acute OT with PLOF of being independent with dressing, doing so-so bathing, and toileting up until a month ago when things started to decline. Today's session was limited due to lunch tray arriving not long after I entered room and pt saying he wanted to eat instead of finishing with me. He is able to lean forward out of his base of support while seated in recliner without LOB, he can set up his own food tray and he can doff/donn socks crossing his legs alternately in a figure 4 fashion. He will continue to benefit from acute OT with follow up from continued inpatient follow up therapy, <3 hours/day to get him to a level his wife can better A him at home and give him time for his confusion to clear.     If plan is discharge home, recommend the following:   A little help with walking and/or transfers;A little help with bathing/dressing/bathroom;Assistance with cooking/housework;Assist for transportation;Direct supervision/assist for financial management;Direct supervision/assist for medications management;Help with stairs or ramp for entrance     Functional Status Assessment   Patient has had a recent decline in their functional status and demonstrates the ability to make significant improvements in function in a reasonable and predictable amount of time.     Equipment Recommendations   None recommended by OT      Precautions/Restrictions   Precautions Precautions: Fall Restrictions Weight Bearing Restrictions Per  Provider Order: No     Mobility Bed Mobility               General bed mobility comments: Pt up in recliner upon arrival        Balance Overall balance assessment: no balance deficits while seated in recliner when leaning out of his base of support.                                         ADL either performed or assessed with clinical judgement   ADL Overall ADL's : Needs assistance/impaired Eating/Feeding: Independent;Sitting   Grooming: Set up;Sitting   Upper Body Bathing: Set up;Sitting   Lower Body Bathing: Set up;Supervison/ safety Lower Body Bathing Details (indicate cue type and reason): sitting to and crossing legs in an alternating figure 4 fashion to wash feet Upper Body Dressing : Set up;Sitting   Lower Body Dressing: Contact guard assist;Sit to/from stand Lower Body Dressing Details (indicate cue type and reason): sitting to and crossing legs in an alternating figure 4 fashion to doff and donn his socks                     Vision Patient Visual Report: No change from baseline              Pertinent Vitals/Pain Pain Assessment Pain Assessment: No/denies pain     Extremity/Trunk Assessment Upper Extremity Assessment Upper Extremity Assessment: Generalized weakness     Communication Communication Communication: No apparent difficulties   Cognition Arousal: Alert  Behavior During Therapy: WFL for tasks assessed/performed               OT - Cognition Comments: per report from wife, pt will not let her A him with anything, but did allow her bath him just a few days before admission                 Following commands: Impaired Following commands impaired: Follows one step commands with increased time     Cueing    Cueing Techniques: Tactile cues;Verbal cues;Gestural cues;Visual cues              Home Living Family/patient expects to be discharged to:: Private residence Living Arrangements:  Spouse/significant other Available Help at Discharge: Family;Available 24 hours/day Type of Home: House Home Access: Stairs to enter [employer] of Steps: 3 Entrance Stairs-Rails: Right;Left Home Layout: One level     Bathroom Shower/Tub: Producer, television/film/video: Standard     Home Equipment: None   Additional Comments: Wife ambulates with cane herself. Son assists as able but he works.      Prior Functioning/Environment Prior Level of Function : Independent/Modified Independent             Mobility Comments: at least 2 falls in the past 6 months, driving up until 2 weeks ago but since then pt with minimal OOB activity (assistance for bed>chair transfers) ADLs Comments: dressing without assistance, not bathing much but wouldn't allow family to really assist, was likely sponge bathing though    OT Problem List: Impaired balance (sitting and/or standing)   OT Treatment/Interventions: Self-care/ADL training;DME and/or AE instruction;Balance training;Patient/family education      OT Goals(Current goals can be found in the care plan section)   Acute Rehab OT Goals Patient Stated Goal: to eat his lunch OT Goal Formulation: With family Time For Goal Achievement: 02/08/24 Potential to Achieve Goals: Good   OT Frequency:  Min 2X/week       AM-PAC OT 6 Clicks Daily Activity     Outcome Measure Help from another person eating meals?: None Help from another person taking care of personal grooming?: A Little Help from another person toileting, which includes using toliet, bedpan, or urinal?: A Little Help from another person bathing (including washing, rinsing, drying)?: A Little Help from another person to put on and taking off regular upper body clothing?: A Little Help from another person to put on and taking off regular lower body clothing?: A Little 6 Click Score: 19   End of Session    Activity Tolerance:  (limited by pt wanting to eat his  lunch that arrived not too long after I was in to see him) Patient left: in chair;with call bell/phone within reach;with chair alarm set;with family/visitor present  OT Visit Diagnosis: Unsteadiness on feet (R26.81);Muscle weakness (generalized) (M62.81)                Time: 8861-8844 OT Time Calculation (min): 17 min Charges:  OT General Charges $OT Visit: 1 Visit OT Evaluation $OT Eval Low Complexity: 1 Low  Donny BECKER OT Acute Rehabilitation Services Office [phone]    [place] Dorothyann Distel 01/24/2024, 1:53 PM

## 2024-01-24 NOTE — Progress Notes (Addendum)
 Daily Progress Note Intern Pager: [phone]  Patient name: Jeff Parks Medical record number: [id number] Date of birth: May 17, 1929 Age: 89 y.o. Gender: male  Primary Care Provider: Patient, No Pcp Per Consultants: GI, Urology Code Status: FULL  Pt Overview and Major Events to Date:  9/25 - Admitted  Patient is a 89 year old male presenting with weakness and AMS, admitted with anemia and suspected UTI. Pertinent PMH/PSH includes hyperthyroidism, hx of bladder outlet obstruction, T2DM, CKD, hx of hydronephrosis. Assessment & Plan Anemia VSS. Hgb 7.2 s/p 1 U pRBC transfusion. Low folate and iron. - AM CBC - Folate and iron supplementation - 1 unit pRBC today, post transfusion H&H UTI (urinary tract infection) BPH (benign prostatic hyperplasia) CTAP with moderate right and left hydronephrosis, likely 2/2 BPH. CTAP significant for 2.1 x 2.2 x 4.8 cm lobulated mural mass involving the left posterolateral base of the bladder, suspicious for primary urothelial malignancy. Has hx of recurrent UTI (recently diagnosed 7 weeks ago and treated with Keflex ). Cr at baseline.  - Urology consulted, recommended outpatient follow-up for further evaluation including cystoscopy and biopsy of mass - Continue Ceftriaxone  2 g daily for UTI - UCx reordered and pending - Blood Cx: NGTD - Q8H bladder scans - AM BMP Physical deconditioning Patient has been largely sedentary, confined to bed for last two weeks (likely related to malnutrition, leg weakness, UTI, and anemia). Likely contributor to recent fall. CK normal. - mIVF as indicated - RD consulted - PT/OT eval and treat Sacral wound, initial encounter - Wound care consulted Chronic health problem CKD- no home meds, appears at baseline T2DM- no home meds, cannot update A1c accurately in the setting of recent transfusion; added sensitive SSI Hyperthyroidism- no home meds; TSH wnl   FEN/GI: Regular diet PPx: SCDs given active bleed Dispo:Home  pending clinical improvement   Subjective:  Patient seen at bedside accompanied by daughter. Daughter reports patient still confused, not at mental baseline, still feeling fatigued. Patient oriented to name only, thinks he is at home. He ate a little of his breakfast this morning. Appetite still poor. Updated daughter and patient's wife via phone regarding clinical course, CTAP results.  Objective: Temp:  [97.4 F (36.3 C)-98 F (36.7 C)] 97.9 F (36.6 C) (09/26 0748) Pulse Rate:  [63-87] 76 (09/26 0748) Resp:  [13-18] 17 (09/25 1952) BP: (87-103)/(47-67) 102/58 (09/26 0748) SpO2:  [96 %-100 %] 100 % (09/26 0748)  Physical Exam: General: frail, cachetic elderly male resting in bed, NAD Cardiovascular: regular rate and rhythm; normal S1/S2 Respiratory: clear to auscultation bilaterally; normal WOB on RA Abdomen: soft, non-distended, non-tender; normal bowel sounds Extremities: non-edematous; skin warm and dry; moving all 4 extremities equally  Laboratory: Most recent CBC Lab Results  Component Value Date   WBC 16.9 (H) 01/23/2024   HGB 7.2 (L) 01/23/2024   HCT 22.8 (L) 01/23/2024   MCV 88.3 01/23/2024   PLT 421 (H) 01/23/2024   Most recent BMP    Latest Ref Rng & Units 01/23/2024   10:47 AM  BMP  Glucose 70 - 99 mg/dL 891   BUN 8 - 23 mg/dL 36   Creatinine 9.38 - 1.24 mg/dL 8.19   Sodium 864 - 854 mmol/L 139   Potassium 3.5 - 5.1 mmol/L 4.7   Chloride 98 - 111 mmol/L 109     Imaging/Diagnostic Tests: No new imaging  Mannie Ashley SAILOR, MD 01/24/2024, 7:59 AM  PGY-1, [facility] [facility] FPTS Intern pager: [phone], text pages welcome Secure chat  group [facility] Teaching Service

## 2024-01-24 NOTE — Progress Notes (Signed)
 Initial Nutrition Assessment  DOCUMENTATION CODES:   Severe malnutrition in context of chronic illness  INTERVENTION:  Regular diet  Ensure Plus High Protein po BID, each supplement provides 350 kcal and 20 grams of protein. MVI w/minerals daily  Order Phos and Mg x 3 days, replete as needed   NUTRITION DIAGNOSIS:   Severe Malnutrition related to chronic illness as evidenced by severe muscle depletion, severe fat depletion.    GOAL:   Patient will meet greater than or equal to 90% of their needs    MONITOR:   PO intake, Supplement acceptance  REASON FOR ASSESSMENT:   Consult Assessment of nutrition requirement/status  ASSESSMENT:   Presenting with weakness and AMS, admitted with anemia and suspected UTI. Pertinent PMH/PSH includes hyperthyroidism, hx of bladder outlet obstruction, T2DM, CKD, hx of hydronephrosis.  Met with patient and spouse at bedside. Patient with 2 weeks of poor po intake at home, increased weakness reported. Prior to this patient was eating 2 standard size meals per day with 1-2 bottles of ensure. Offered patient ensures to be ordered in between meals, he prefers vanilla. Patient with improved appetite this afternoon and ate 90% of lunch tray. Last weight in chart from 2017, update bed scale weight (124 lbs) taken and updated in chart. Patient confirms weight loss over unspecified amount of time and remembers weighing 160 lbs at one point. Patient with severe fat loss and muscle wasting noted on NFPE, meets ASPEN criteria for malnutrition.   Meds: Ferrous sulfate  PO daily  Folic acid  daily  SSI Novolog  TID   Labs: Glu 108  BUN 36 Cr 1.80  Calcium  8.5  GFR 38   NUTRITION - FOCUSED PHYSICAL EXAM:  Flowsheet Row Most Recent Value  Orbital Region Severe depletion  Upper Arm Region Severe depletion  Thoracic and Lumbar Region Severe depletion  Buccal Region Severe depletion  Temple Region Severe depletion  Clavicle Bone Region Severe  depletion  Clavicle and Acromion Bone Region Severe depletion  Scapular Bone Region Severe depletion  Dorsal Hand Severe depletion  Patellar Region Severe depletion  Anterior Thigh Region Severe depletion  Posterior Calf Region Severe depletion  Hair Reviewed  Eyes Reviewed  Mouth Reviewed  Skin Reviewed  Nails Reviewed    Diet Order:   Diet Order             Diet regular Room service appropriate? Yes; Fluid consistency: Thin  Diet effective now                   EDUCATION NEEDS:   No education needs have been identified at this time  Skin:  Skin Assessment: Skin Integrity Issues: Skin Integrity Issues:: Unstageable Unstageable: lower back (lumbar spine)  Last BM:  PTA  Height:   Ht Readings from Last 1 Encounters:  01/24/24 6' 0.01 (1.829 m)    Weight:   Wt Readings from Last 1 Encounters:  01/24/24 56.3 kg    Ideal Body Weight:  80.9 kg  BMI:  Body mass index is 16.83 kg/m.  Estimated Nutritional Needs:   Kcal:  8591-8310 kcals  Protein:  56-84 grams  Fluid:  >1.5L/d    Rayel Santizo, MS, RD, LDN Clinical Dietitian  Please see AMiON for contact information.

## 2024-01-25 DIAGNOSIS — D649 Anemia, unspecified: Secondary | ICD-10-CM

## 2024-01-25 LAB — GLUCOSE, CAPILLARY
Glucose-Capillary: 93 mg/dL (ref 70–99)
Glucose-Capillary: 88 mg/dL (ref 70–99)
Glucose-Capillary: 117 mg/dL — ABNORMAL HIGH (ref 70–99)
Glucose-Capillary: 129 mg/dL — ABNORMAL HIGH (ref 70–99)

## 2024-01-25 LAB — TYPE AND SCREEN
ABO/RH(D): A POS
Antibody Screen: NEGATIVE
Unit division: 0
Unit division: 0

## 2024-01-25 LAB — CBC
HCT: 25.7 % — ABNORMAL LOW (ref 39.0–52.0)
Hemoglobin: 8.1 g/dL — ABNORMAL LOW (ref 13.0–17.0)
MCH: 27.2 pg (ref 26.0–34.0)
MCHC: 31.5 g/dL (ref 30.0–36.0)
MCV: 86.2 fL (ref 80.0–100.0)
Platelets: 338 K/uL (ref 150–400)
RBC: 2.98 MIL/uL — ABNORMAL LOW (ref 4.22–5.81)
RDW: 16.5 % — ABNORMAL HIGH (ref 11.5–15.5)
WBC: 15.1 K/uL — ABNORMAL HIGH (ref 4.0–10.5)
nRBC: 0 % (ref 0.0–0.2)

## 2024-01-25 LAB — BPAM RBC
Blood Product Expiration Date: 202510032359
Blood Product Expiration Date: 202510232359
ISSUE DATE / TIME: 202509251256
ISSUE DATE / TIME: 202509261252
Unit Type and Rh: 6200
Unit Type and Rh: 6200

## 2024-01-25 NOTE — Assessment & Plan Note (Addendum)
 Patient has been largely sedentary, confined to bed for last two weeks (likely multifactorial from malnutrition, leg weakness, UTI, deconditioning, and anemia). Likely contributor to recent fall. CK normal. - mIVF as indicated, currently 110 mL/h LR - RD consulted, and determined severe protein-calorie malnutrition, Ensure Plus high-protein p.o. twice daily, ordered Phos and mag next 3 days - PT/OT recommend ambulating with HHA, confused during PT session

## 2024-01-25 NOTE — Assessment & Plan Note (Addendum)
 Persistently hypotensive likely secondary to malnutrition, otherwise stable. Hgb today 8.1, last Hgb 7.2 9/25, but posttransfusion H&H was not collected yesterday as patient refused.  S/p 2 U pRBC transfusion. Low folate and iron. - Pending stat CBC, patient has refused but wife discussed importance of this with him - Continue to monitor

## 2024-01-25 NOTE — Progress Notes (Incomplete)
 In

## 2024-01-25 NOTE — Assessment & Plan Note (Addendum)
-   Wound care consulted, pending their evaluation and recs

## 2024-01-25 NOTE — Progress Notes (Signed)
 Daily Progress Note Intern Pager: [phone]  Patient name: Jeff Parks Medical record number: [id number] Date of birth: 21-Jan-1930 Age: 89 y.o. Gender: male  Primary Care Provider: Patient, No Pcp Per Consultants: GI, urology Code Status: Full  Pt Overview and Major Events to Date:  9/25-received 1 unit PRBC 9/25-admitted to [facility] TS 9/26-received 1 unit PRBC  Assessment:  Patient is a 89 year old male presenting with weakness and AMS, admitted with anemia and suspected UTI found to have lobulated mural mass of bladder on CTAP. Pertinent PMH/PSH includes hyperthyroidism, hx of bladder outlet obstruction, T2DM, CKD, hx of hydronephrosis.  Assessment & Plan Anemia Persistently hypotensive likely secondary to malnutrition, otherwise stable. Hgb today 8.1, last Hgb 7.2 9/25, but posttransfusion H&H was not collected yesterday as patient refused.  S/p 2 U pRBC transfusion. Low folate and iron. - Pending stat CBC, patient has refused but wife discussed importance of this with him - Continue to monitor UTI (urinary tract infection) BPH (benign prostatic hyperplasia) CTAP with moderate right and left hydronephrosis, likely 2/2 BPH. CTAP found lobulated mural mass of bladder, suspicious for primary urothelial malignancy. Has hx of recurrent UTI (recently diagnosed 7 weeks ago and treated with Keflex ). - Urology consulted, will follow-up outpatient - DC Ceftriaxone  2 g daily for UTI, given clinically stable - Start cefuroxime 500 mg BID for last dose 10/2 morning - UCx found multiple species, pending recollection - Blood Cx: NGTD 2 days - Will DC Q8H bladder scans for no signs of retention over last 24 hours - AM BMP, CBC, mg phos Physical deconditioning Protein-calorie malnutrition, severe Patient has been largely sedentary, confined to bed for last two weeks (likely multifactorial from malnutrition, leg weakness, UTI, deconditioning, and anemia). Likely contributor to recent fall. CK  normal. - mIVF as indicated, currently 110 mL/h LR - RD consulted, and determined severe protein-calorie malnutrition, Ensure Plus high-protein p.o. twice daily, ordered Phos and mag next 3 days - PT/OT recommend ambulating with HHA, confused during PT session Sacral wound, initial encounter - Wound care consulted, pending their evaluation and recs Chronic health problem CKD- no home meds, appears at baseline T2DM- no home meds, cannot update A1c accurately in the setting of recent transfusion; added sensitive SSI Hyperthyroidism- no home meds; TSH wnl   FEN/GI: Regular diet PPx: SCDs Dispo:Home health pending clinical improvement . Barriers include mobility, anemia.   Subjective:  Saw patient at bedside, he was frustrated that people are trying to take all my blood.  I explained to the patient that this is the only way we can ensure if he has enough blood in him and that this is our role in the hospital.  He expressed understanding.  States that he feels fine denies any nausea, vomiting, pain.  Reports that he is moving around the room a little bit and needs some assistance.  Objective: Temp:  [97.9 F (36.6 C)-98.4 F (36.9 C)] 98.3 F (36.8 C) (09/27 0759) Pulse Rate:  [63-72] 63 (09/27 0759) Resp:  [15] 15 (09/26 1315) BP: (80-96)/(48-59) 92/55 (09/27 0759) SpO2:  [100 %] 100 % (09/27 0759) Weight:  [56.3 kg] 56.3 kg (09/26 1532) Physical Exam: General: Cachectic appearing, frustrated Cardiovascular: Regular rate rhythm, no murmurs rubs or gallops Respiratory: Clear to auscultation bilaterally Abdomen: Active bowel sounds, nontender Extremities: Nonedematous, nontender  Laboratory: Most recent CBC Lab Results  Component Value Date   WBC 16.9 (H) 01/23/2024   HGB 7.2 (L) 01/23/2024   HCT 22.8 (L) 01/23/2024   MCV  88.3 01/23/2024   PLT 421 (H) 01/23/2024   Most recent BMP    Latest Ref Rng & Units 01/23/2024   10:47 AM  BMP  Glucose 70 - 99 mg/dL 891   BUN 8 -  23 mg/dL 36   Creatinine 9.38 - 1.24 mg/dL 8.19   Sodium 864 - 854 mmol/L 139   Potassium 3.5 - 5.1 mmol/L 4.7   Chloride 98 - 111 mmol/L 109      Imaging/Diagnostic Tests: No new imaging  Lorrane Pac, MD 01/25/2024, 9:52 AM  PGY-1, [place] [facility] FPTS Intern pager: [phone], text pages welcome Secure chat group [facility] Teaching Service

## 2024-01-25 NOTE — Assessment & Plan Note (Addendum)
 CTAP with moderate right and left hydronephrosis, likely 2/2 BPH. CTAP found lobulated mural mass of bladder, suspicious for primary urothelial malignancy. Has hx of recurrent UTI (recently diagnosed 7 weeks ago and treated with Keflex ). - Urology consulted, will follow-up outpatient - DC Ceftriaxone  2 g daily for UTI, given clinically stable - Start cefuroxime 500 mg BID for last dose 10/2 morning - UCx found multiple species, pending recollection - Blood Cx: NGTD 2 days - Will DC Q8H bladder scans for no signs of retention over last 24 hours - AM BMP, CBC, mg phos

## 2024-01-25 NOTE — Assessment & Plan Note (Signed)
 CKD- no home meds, appears at baseline T2DM- no home meds, cannot update A1c accurately in the setting of recent transfusion; added sensitive SSI Hyperthyroidism- no home meds; TSH wnl

## 2024-01-26 DIAGNOSIS — N3000 Acute cystitis without hematuria: Secondary | ICD-10-CM

## 2024-01-26 LAB — CBC WITH DIFFERENTIAL/PLATELET
Abs Immature Granulocytes: 0.08 K/uL — ABNORMAL HIGH (ref 0.00–0.07)
Basophils Absolute: 0 K/uL (ref 0.0–0.1)
Basophils Relative: 0 %
Eosinophils Absolute: 0.1 K/uL (ref 0.0–0.5)
Eosinophils Relative: 1 %
HCT: 24.3 % — ABNORMAL LOW (ref 39.0–52.0)
Hemoglobin: 7.7 g/dL — ABNORMAL LOW (ref 13.0–17.0)
Immature Granulocytes: 1 %
Lymphocytes Relative: 18 %
Lymphs Abs: 2.7 K/uL (ref 0.7–4.0)
MCH: 27.1 pg (ref 26.0–34.0)
MCHC: 31.7 g/dL (ref 30.0–36.0)
MCV: 85.6 fL (ref 80.0–100.0)
Monocytes Absolute: 1.1 K/uL — ABNORMAL HIGH (ref 0.1–1.0)
Monocytes Relative: 8 %
Neutro Abs: 10.5 K/uL — ABNORMAL HIGH (ref 1.7–7.7)
Neutrophils Relative %: 72 %
Platelets: 331 K/uL (ref 150–400)
RBC: 2.84 MIL/uL — ABNORMAL LOW (ref 4.22–5.81)
RDW: 16.4 % — ABNORMAL HIGH (ref 11.5–15.5)
WBC: 14.5 K/uL — ABNORMAL HIGH (ref 4.0–10.5)
nRBC: 0 % (ref 0.0–0.2)

## 2024-01-26 LAB — GLUCOSE, CAPILLARY
Glucose-Capillary: 121 mg/dL — ABNORMAL HIGH (ref 70–99)
Glucose-Capillary: 125 mg/dL — ABNORMAL HIGH (ref 70–99)
Glucose-Capillary: 108 mg/dL — ABNORMAL HIGH (ref 70–99)

## 2024-01-26 LAB — PHOSPHORUS: Phosphorus: 2.6 mg/dL (ref 2.5–4.6)

## 2024-01-26 LAB — URINE CULTURE: Culture: <10000 — AB

## 2024-01-26 LAB — MAGNESIUM: Magnesium: 1.6 mg/dL — ABNORMAL LOW (ref 1.7–2.4)

## 2024-01-26 MED ORDER — MAGNESIUM SULFATE IN D5W 1-5 GM/100ML-% IV SOLN
1.0000 g | Freq: Once | INTRAVENOUS | Status: AC
  Administered 2024-01-26: 1 g via INTRAVENOUS
  Filled 2024-01-26: qty 100

## 2024-01-26 NOTE — Assessment & Plan Note (Addendum)
 CTAP with moderate right and left hydronephrosis, likely 2/2 BPH. CTAP found lobulated mural mass of bladder, suspicious for primary urothelial malignancy. Has hx of recurrent UTI (recently diagnosed 7 weeks ago and treated with Keflex ). - Urology consulted, will follow-up outpatient - Continue IV ceftriaxone  2 g  - Blood Cx: NGTD

## 2024-01-26 NOTE — Assessment & Plan Note (Addendum)
 Patient has been largely sedentary, confined to bed for last two weeks (likely multifactorial from malnutrition, leg weakness, UTI, deconditioning, and anemia). Likely contributor to recent fall. Mg repleted this A.M. - mIVF as indicated - RD consulted, and determined severe protein-calorie malnutrition, Ensure Plus high-protein p.o. twice daily, folate and iron supplementation daily - PT/OT recommend ambulating with HHA - AM Mg, phos

## 2024-01-26 NOTE — Plan of Care (Signed)
 Spoke with patient's wife and daughter at bedside today. Wife states she would ideally like patient to go to SNF rehab for at least a few weeks, until she is able to sort out a long-term care plan. She is unsure of how long Medicare would pay for him to stay there. States she will talk to patient and try to get him to agree to go to SNF. She and patient live alone, and she does not have much other support in taking care of him currently. If patient does not agree to go, she is okay with taking him home. States she will try to find distant family and friends to help out. States she cannot afford to hire professional help.

## 2024-01-26 NOTE — Progress Notes (Addendum)
 Daily Progress Note Intern Pager: [phone]  Patient name: Jeff Parks Medical record number: [id number] Date of birth: 04/12/1930 Age: 89 y.o. Gender: male  Primary Care Provider: Patient, No Pcp Per Consultants: Urology Code Status: FULL  Pt Overview and Major Events to Date:  9/25-received 1 unit PRBC 9/25-admitted to FM TS 9/26-received 1 unit PRBC  Patient is a 89 year old male presenting with weakness and AMS, admitted with anemia and suspected UTI found to have lobulated mural mass of bladder on CTAP. Pertinent PMH/PSH includes hyperthyroidism, hx of bladder outlet obstruction, T2DM, CKD, hx of hydronephrosis.  Assessment & Plan Anemia Persistently hypotensive likely secondary to malnutrition, otherwise stable and asymptomatic. Hgb 8.1 --> 7.7 today. S/p 2 U pRBC transfusion.  - Continue to monitor - AM CBC UTI (urinary tract infection) BPH (benign prostatic hyperplasia) CTAP with moderate right and left hydronephrosis, likely 2/2 BPH. CTAP found lobulated mural mass of bladder, suspicious for primary urothelial malignancy. Has hx of recurrent UTI (recently diagnosed 7 weeks ago and treated with Keflex ). - Urology consulted, will follow-up outpatient - Continue IV ceftriaxone  2 g  - Blood Cx: NGTD Physical deconditioning Protein-calorie malnutrition, severe Patient has been largely sedentary, confined to bed for last two weeks (likely multifactorial from malnutrition, leg weakness, UTI, deconditioning, and anemia). Likely contributor to recent fall. Mg repleted this A.M. - mIVF as indicated - RD consulted, and determined severe protein-calorie malnutrition, Ensure Plus high-protein p.o. twice daily, folate and iron supplementation daily - PT/OT recommend ambulating with HHA - AM Mg, phos Sacral wound, initial encounter - Wound care consulted. Recommended the following:  -Cleanse with NS, pat dry. Apply 1/4 thick layer of leptospermum honey to wound bed, top with dry  dressing and silicone foam, change daily.  Chronic health problem CKD- no home meds, appears at baseline T2DM- no home meds, cannot update A1c accurately in the setting of recent transfusion; added sensitive SSI Hyperthyroidism- no home meds; TSH wnl  FEN/GI: Regular diet PPx: SCDs Dispo: Home health pending clinical improvement. Barriers include mobility, anemia.  Subjective:  Patient seen at bedside resting. Denies pain or any physical discomfort. States he is eating and drinking okay. Encouraged p.o. intake to patient.  Objective: Temp:  [97.8 F (36.6 C)-99.1 F (37.3 C)] 98.4 F (36.9 C) (09/28 0735) Pulse Rate:  [63-77] 66 (09/28 0735) Resp:  [17] 17 (09/28 0443) BP: (89-97)/(49-58) 97/58 (09/28 0735) SpO2:  [99 %-100 %] 99 % (09/28 0735) Weight:  [60.4 kg] 60.4 kg (09/28 0443)  Physical Exam: General: frail, cachetic elderly male resting in bed, NAD Cardiovascular: regular rate and rhythm; normal S1/S2 Respiratory: clear to auscultation bilaterally; normal WOB on RA Abdomen: soft, non-distended, non-tender; normal bowel sounds Extremities: non-edematous; skin warm and dry; moving all 4 extremities equally  Laboratory: Most recent CBC Lab Results  Component Value Date   WBC 14.5 (H) 01/26/2024   HGB 7.7 (L) 01/26/2024   HCT 24.3 (L) 01/26/2024   MCV 85.6 01/26/2024   PLT 331 01/26/2024   Most recent BMP    Latest Ref Rng & Units 01/23/2024   10:47 AM  BMP  Glucose 70 - 99 mg/dL 891   BUN 8 - 23 mg/dL 36   Creatinine 9.38 - 1.24 mg/dL 8.19   Sodium 864 - 854 mmol/L 139   Potassium 3.5 - 5.1 mmol/L 4.7   Chloride 98 - 111 mmol/L 109     Other pertinent labs: Mag 1.6, Phos 2.6  Imaging/Diagnostic Tests: No new imaging  Mannie Ashley SAILOR, MD 01/26/2024, 7:39 AM  PGY-1, [facility] FPTS Intern pager: [phone], text pages welcome Secure chat group [facility] Teaching Service

## 2024-01-26 NOTE — Assessment & Plan Note (Addendum)
 Persistently hypotensive likely secondary to malnutrition, otherwise stable and asymptomatic. Hgb 8.1 --> 7.7 today. S/p 2 U pRBC transfusion.  - Continue to monitor - AM CBC

## 2024-01-26 NOTE — Plan of Care (Signed)

## 2024-01-26 NOTE — Assessment & Plan Note (Signed)
 CKD- no home meds, appears at baseline T2DM- no home meds, cannot update A1c accurately in the setting of recent transfusion; added sensitive SSI Hyperthyroidism- no home meds; TSH wnl

## 2024-01-26 NOTE — Assessment & Plan Note (Addendum)
-   Wound care consulted. Recommended the following:  -Cleanse with NS, pat dry. Apply 1/4 thick layer of leptospermum honey to wound bed, top with dry dressing and silicone foam, change daily.

## 2024-01-27 LAB — CBC
HCT: 28.3 % — ABNORMAL LOW (ref 39.0–52.0)
Hemoglobin: 8.7 g/dL — ABNORMAL LOW (ref 13.0–17.0)
MCH: 27.1 pg (ref 26.0–34.0)
MCHC: 30.7 g/dL (ref 30.0–36.0)
MCV: 88.2 fL (ref 80.0–100.0)
Platelets: 318 K/uL (ref 150–400)
RBC: 3.21 MIL/uL — ABNORMAL LOW (ref 4.22–5.81)
RDW: 16.8 % — ABNORMAL HIGH (ref 11.5–15.5)
WBC: 15.4 K/uL — ABNORMAL HIGH (ref 4.0–10.5)
nRBC: 0 % (ref 0.0–0.2)

## 2024-01-27 LAB — GLUCOSE, CAPILLARY: Glucose-Capillary: 95 mg/dL (ref 70–99)

## 2024-01-27 LAB — MAGNESIUM: Magnesium: 2 mg/dL (ref 1.7–2.4)

## 2024-01-27 LAB — PHOSPHORUS: Phosphorus: 2.7 mg/dL (ref 2.5–4.6)

## 2024-01-27 MED ORDER — SODIUM CHLORIDE 0.9 % IV SOLN
2.0000 g | INTRAVENOUS | Status: AC
Start: 2024-01-27 — End: 2024-01-28
  Administered 2024-01-27: 2 g via INTRAVENOUS
  Filled 2024-01-27: qty 20

## 2024-01-27 NOTE — Assessment & Plan Note (Addendum)
 Patient has been largely sedentary, confined to bed for last two weeks prior to admission (likely multifactorial from malnutrition, leg weakness, UTI, deconditioning, and anemia). - mIVF as indicated - RD consulted, and determined severe protein-calorie malnutrition, Ensure Plus high-protein p.o. twice daily, folate and iron supplementation daily - PT/OT recommend ambulating with HHA

## 2024-01-27 NOTE — Care Management Important Message (Signed)
 Important Message  Patient Details  Name: Jeff Parks MRN: [id number] Date of Birth: 1929/09/15   Important Message Given:  Yes - Medicare IM     Claretta Deed 01/27/2024, 3:41 PM

## 2024-01-27 NOTE — Progress Notes (Signed)
 Physical Therapy Treatment Patient Details Name: Jeff Parks MRN: [id number] DOB: 06-07-1929 Today's Date: 01/27/2024   History of Present Illness Pt is a 89 y/o M admitted on 01/23/24 after presenting with c/o weakness & AMS, endorses fall 3 days prior. Pt is being treated for anemia, UTI. PMH: AKI, anemia, hydronephrosis, constipation, foley catheter, hyperthyroidism, latent syphilis, UTI    PT Comments  Pt seen for PT tx with wife in room. Pt requires encouragement for OOB mobility, begins to transition to sitting EOB but declines putting feet on floor, reporting he won't get up because he's already been up this morning (NT reports pt sat in recliner this AM). PT educated pt on importance of OOB mobility but pt continues to decline. Pt is agreeable to supine BLE exercises but not eager to follow cuing re: technique.  Continue to recommend post acute rehab <3 hours therapy/day upon d/c.3   If plan is discharge home, recommend the following: Assistance with cooking/housework;Assistance with feeding;A lot of help with walking and/or transfers;A lot of help with bathing/dressing/bathroom;Direct supervision/assist for financial management;Supervision due to cognitive status;Direct supervision/assist for medications management;Help with stairs or ramp for entrance;Assist for transportation   Can travel by private vehicle     Yes  Equipment Recommendations  BSC/3in1;Rolling walker (2 wheels)    Recommendations for Other Services       Precautions / Restrictions Precautions Precautions: Fall Restrictions Weight Bearing Restrictions Per Provider Order: No     Mobility  Bed Mobility               General bed mobility comments: Pt begins to transition to sitting EOB with supervision<>min assist but declines bringing legs off EOB & returns to supine.    Transfers                        Ambulation/Gait                   Stairs             Wheelchair  Mobility     Tilt Bed    Modified Rankin (Stroke Patients Only)       Balance                                            Communication Communication Communication: No apparent difficulties  Cognition Arousal: Alert Behavior During Therapy: WFL for tasks assessed/performed   PT - Cognitive impairments: Orientation, Awareness, Memory, Problem solving, Safety/Judgement   Orientation impairments: Time                     Following commands: Impaired Following commands impaired: Follows one step commands with increased time    Cueing Cueing Techniques: Tactile cues, Verbal cues, Gestural cues, Visual cues  Exercises General Exercises - Lower Extremity Hip ABduction/ADduction: AROM, Supine, Strengthening, Both, 10 reps Straight Leg Raises: AROM, Supine, Strengthening, Both, 10 reps    General Comments        Pertinent Vitals/Pain Pain Assessment Pain Assessment: No/denies pain    Home Living                          Prior Function            PT Goals (current goals can now be found in the care plan section)  Acute Rehab PT Goals Patient Stated Goal: get better PT Goal Formulation: With patient/family Time For Goal Achievement: 02/07/24 Potential to Achieve Goals: Good Progress towards PT goals: Not progressing toward goals - comment (pt agreeable to minimal participation)    Frequency           PT Plan      Co-evaluation              AM-PAC PT 6 Clicks Mobility   Outcome Measure  Help needed turning from your back to your side while in a flat bed without using bedrails?: None Help needed moving from lying on your back to sitting on the side of a flat bed without using bedrails?: A Little Help needed moving to and from a bed to a chair (including a wheelchair)?: A Little Help needed standing up from a chair using your arms (e.g., wheelchair or bedside chair)?: A Little Help needed to walk in hospital  room?: A Little Help needed climbing 3-5 steps with a railing? : A Lot 6 Click Score: 18    End of Session   Activity Tolerance: Patient limited by fatigue (pt self limiting) Patient left: in bed;with call bell/phone within reach;with bed alarm set;with family/visitor present   PT Visit Diagnosis: Unsteadiness on feet (R26.81);Muscle weakness (generalized) (M62.81);Other abnormalities of gait and mobility (R26.89);Difficulty in walking, not elsewhere classified (R26.2)     Time: 8494-8485 PT Time Calculation (min) (ACUTE ONLY): 9 min  Charges:    $Therapeutic Activity: 8-22 mins PT General Charges $$ ACUTE PT VISIT: 1 Visit                     Richerd Pinal, PT, DPT 01/27/24, 3:25 PM   Richerd CHRISTELLA Pinal 01/27/2024, 3:24 PM

## 2024-01-27 NOTE — Plan of Care (Signed)
   Problem: Education: Goal: Emotional status will improve Outcome: Progressing   Problem: Education: Goal: Mental status will improve Outcome: Progressing

## 2024-01-27 NOTE — Assessment & Plan Note (Addendum)
 Stable, asymptomatic. Likely secondary to poor nutritional intake. Hgb 7.7 --> 8.7 today. S/p 2 units pRBCs. - Continue to monitor

## 2024-01-27 NOTE — Assessment & Plan Note (Addendum)
 CTAP with moderate right and left hydronephrosis, likely 2/2 BPH. CTAP found lobulated mural mass of bladder, suspicious for primary urothelial malignancy. Has hx of recurrent UTI (recently diagnosed 7 weeks ago and treated with Keflex ). - Urology consulted, will follow-up outpatient - Continue IV ceftriaxone  2 g (9/25-9/29) - Blood Cx: NGTD

## 2024-01-27 NOTE — TOC Progression Note (Signed)
 Transition of Care [facility]) - Progression Note    Patient Details  Name: Jeff Parks MRN: [id number] Date of Birth: 1929/09/05  Transition of Care [facility]) CM/SW Contact  Lendia Dais, [place] Phone Number: 01/27/2024, 3:19 PM  Clinical Narrative: CSW spoke to patient, pt's spouse and nephew at bedside.  Pt's wife Delayne stated that her and the pt spoke and came to an agreement of discharging to a SNF. Pt's nephew at bedside stated that he could provide care for the pt and PT services because he works out a lot. CSW explained that physical therapy is done by a licensed professional and requires that education to provide the medical assistance needed. Delayne stated she still wanted to move forward with a SNF.  Pt is pending bed offers and CSW will continue to monitor.     Expected Discharge Plan: Skilled Nursing Facility Barriers to Discharge: Continued Medical Work up, Family Issues, Other (must enter comment) (SNF vs Home)               Expected Discharge Plan and Services In-house Referral: Clinical Social Work     Living arrangements for the past 2 months: Single Family Home                                       Social Drivers of Health (SDOH) Interventions SDOH Screenings   Food Insecurity: No Food Insecurity (01/23/2024)  Housing: Low Risk  (01/23/2024)  Transportation Needs: No Transportation Needs (01/23/2024)  Utilities: Not At Risk (01/23/2024)  Social Connections: Moderately Isolated (01/23/2024)  Tobacco Use: High Risk (01/23/2024)    Readmission Risk Interventions     No data to display

## 2024-01-27 NOTE — Assessment & Plan Note (Addendum)
 CKD- no home meds, appears at baseline Hyperthyroidism- no home meds; TSH wnl

## 2024-01-27 NOTE — Progress Notes (Signed)
     Daily Progress Note Intern Pager: [phone]  Patient name: Jeff Parks Medical record number: [id number] Date of birth: 01-10-1930 Age: 89 y.o. Gender: male  Primary Care Provider: Patient, No Pcp Per Consultants: Urology (signed off) Code Status: FULL  Pt Overview and Major Events to Date:  9/25-received 1 unit PRBC 9/25-admitted to FM TS 9/26-received 1 unit PRBC   Patient is a 89 year old male presenting with weakness and AMS, admitted with anemia and suspected UTI found to have lobulated mural mass of bladder on CTAP. Pertinent PMH/PSH includes hyperthyroidism, hx of bladder outlet obstruction, T2DM, CKD, hx of hydronephrosis.  Assessment & Plan Anemia Stable, asymptomatic. Likely secondary to poor nutritional intake. Hgb 7.7 --> 8.7 today. S/p 2 units pRBCs. - Continue to monitor UTI (urinary tract infection) BPH (benign prostatic hyperplasia) CTAP with moderate right and left hydronephrosis, likely 2/2 BPH. CTAP found lobulated mural mass of bladder, suspicious for primary urothelial malignancy. Has hx of recurrent UTI (recently diagnosed 7 weeks ago and treated with Keflex ). - Urology consulted, will follow-up outpatient - Continue IV ceftriaxone  2 g (9/25-9/29) - Blood Cx: NGTD Physical deconditioning Protein-calorie malnutrition, severe Patient has been largely sedentary, confined to bed for last two weeks prior to admission (likely multifactorial from malnutrition, leg weakness, UTI, deconditioning, and anemia). - mIVF as indicated - RD consulted, and determined severe protein-calorie malnutrition, Ensure Plus high-protein p.o. twice daily, folate and iron supplementation daily - PT/OT recommend ambulating with HHA Sacral wound, initial encounter - Wound care consulted. Recommended the following:  -Cleanse with NS, pat dry. Apply 1/4 thick layer of leptospermum honey to wound bed, top with dry dressing and silicone foam, change daily.  Chronic health problem CKD-  no home meds, appears at baseline Hyperthyroidism- no home meds; TSH wnl  FEN/GI: Regular diet PPx: SCDs Dispo: Patient/family leaning towards SNF. SNF pending placement  Subjective:  Patient seen at bedside eating breakfast. Reports he feels fine. He is open to considering going to SNF for short-term.  Objective: Temp:  [97.9 F (36.6 C)-98.6 F (37 C)] 98 F (36.7 C) (09/29 0545) Pulse Rate:  [66-77] 69 (09/29 0545) Resp:  [17-18] 18 (09/29 0545) BP: (83-99)/(38-58) 96/54 (09/29 0545) SpO2:  [99 %-100 %] 100 % (09/29 0545)  Physical Exam: General: frail elderly male resting in bed, NAD Cardiovascular: regular rate and rhythm; normal S1/S2 Respiratory: clear to auscultation bilaterally; normal WOB on RA Abdomen: soft, non-distended, non-tender; normal bowel sounds Extremities: non-edematous; skin warm and dry; moving all 4 extremities equally  Laboratory: Most recent CBC Lab Results  Component Value Date   WBC 14.5 (H) 01/26/2024   HGB 7.7 (L) 01/26/2024   HCT 24.3 (L) 01/26/2024   MCV 85.6 01/26/2024   PLT 331 01/26/2024   Most recent BMP    Latest Ref Rng & Units 01/23/2024   10:47 AM  BMP  Glucose 70 - 99 mg/dL 891   BUN 8 - 23 mg/dL 36   Creatinine 9.38 - 1.24 mg/dL 8.19   Sodium 864 - 854 mmol/L 139   Potassium 3.5 - 5.1 mmol/L 4.7   Chloride 98 - 111 mmol/L 109    Other pertinent labs: Mg 2.0, Phos 2.7  Imaging/Diagnostic Tests: No new imaging  Mannie Ashley SAILOR, MD 01/27/2024, 7:27 AM  PGY-1, [facility] FPTS Intern pager: [phone], text pages welcome Secure chat group [facility] Teaching Service

## 2024-01-27 NOTE — NC FL2 (Signed)
 [place]  MEDICAID FL2 LEVEL OF CARE FORM     IDENTIFICATION  Patient Name: Jeff Parks Birthdate: Aug 20, 1929 Sex: male Admission Date (Current Location): 01/23/2024  [facility] and [place] Number:  Producer, television/film/video and Address:  [facility], [address], [place], [place] [address]      Provider Number: [id number]  Attending Physician Name and Address:  Donzetta Rollene BRAVO, MD  Relative Name and Phone Number:  Leonidus Rowand (spouse),[phone]    Current Level of Care: Hospital Recommended Level of Care: Skilled Nursing Facility Prior Approval Number:    Date Approved/Denied:   PASRR Number: [id number]  Discharge Plan: SNF    Current Diagnoses: Patient Active Problem List   Diagnosis Date Noted   Pressure injury of skin 01/24/2024   Protein-calorie malnutrition, severe 01/24/2024   Chronic health problem 01/23/2024   Physical deconditioning 01/23/2024   Sacral wound, initial encounter 01/23/2024   BPH (benign prostatic hyperplasia) 01/23/2024   Benign prostatic hyperplasia with urinary obstruction 07/15/2015   Hydronephrosis, bilateral 06/28/2015   Bladder outlet obstruction 06/28/2015   Acute encephalopathy 06/28/2015   Protein-calorie malnutrition, moderate 06/28/2015   UTI (urinary tract infection) 06/15/2015   Hyperthyroidism 06/15/2015   Bright red blood per rectum 06/14/2015   Renal failure 06/13/2015   AKI (acute kidney injury) 06/13/2015   Urinary retention 06/13/2015   Anorexia 06/13/2015   Leukocytosis 06/13/2015   Elevated blood pressure 06/13/2015   Anemia 06/13/2015   Weakness     Orientation RESPIRATION BLADDER Height & Weight     Self, Time, Situation, Place  Normal Incontinent Weight: 133 lb 2.5 oz (60.4 kg) Height:  6' 0.01 (182.9 cm)  BEHAVIORAL SYMPTOMS/MOOD NEUROLOGICAL BOWEL NUTRITION STATUS      Continent Diet (see dc summary)  AMBULATORY STATUS COMMUNICATION OF NEEDS Skin   Limited Assist Verbally  Normal                       Personal Care Assistance Level of Assistance  Bathing, Feeding, Dressing Bathing Assistance: Limited assistance Feeding assistance: Independent Dressing Assistance: Limited assistance     Functional Limitations Info  Sight, Hearing, Speech Sight Info: Adequate Hearing Info: Adequate Speech Info: Adequate    SPECIAL CARE FACTORS FREQUENCY  PT (By licensed PT), OT (By licensed OT)     PT Frequency: 5x a week OT Frequency: 5x a week            Contractures Contractures Info: Not present    Additional Factors Info  Code Status, Allergies Code Status Info: Full Allergies Info: NKA           Current Medications (01/27/2024):  This is the current hospital active medication list Current Facility-Administered Medications  Medication Dose Route Frequency Provider Last Rate Last Admin   acetaminophen  (TYLENOL ) tablet 1,000 mg  1,000 mg Oral Q6H PRN Tharon Lung, MD       feeding supplement (ENSURE PLUS HIGH PROTEIN) liquid 237 mL  237 mL Oral BID BM Donzetta Rollene BRAVO, MD   237 mL at 01/27/24 1220   ferrous sulfate  tablet 325 mg  325 mg Oral Q breakfast Larraine Palma, MD   325 mg at 01/27/24 9047   folic acid  (FOLVITE ) tablet 1 mg  1 mg Oral Daily Nemecek, Amanda, MD   1 mg at 01/27/24 0951   leptospermum manuka honey (MEDIHONEY) paste 1 Application  1 Application Topical Daily Donzetta Rollene BRAVO, MD   1 Application at 01/27/24 [id number]  multivitamin with minerals tablet 1 tablet  1 tablet Oral Daily Pray, Margaret E, MD   1 tablet at 01/27/24 9047     Discharge Medications: Please see discharge summary for a list of discharge medications.  Relevant Imaging Results:  Relevant Lab Results:   Additional Information SSN: [id number]  Lendia Dais, LCSWA

## 2024-01-27 NOTE — Assessment & Plan Note (Signed)
-   Wound care consulted. Recommended the following:  -Cleanse with NS, pat dry. Apply 1/4 thick layer of leptospermum honey to wound bed, top with dry dressing and silicone foam, change daily.

## 2024-01-27 NOTE — Discharge Instructions (Signed)
 Dear Jackee Gentry,  Thank you for letting us  participate in your care. You were hospitalized for weakness and diagnosed with Anemia (low blood count). You were treated with a blood transfusion and your blood count improved. There was a finding of a mass in your bladder, which Urology (the bladder doctors) recommended you follow-up for outpatient.  Other problems managed this admission included a UTI (urinary tract infection), which was treated with antibiotics.  MEDICATION CHANGES -   POST-HOSPITAL & CARE INSTRUCTIONS Follow up with urology about the bladder mass Please call to arrange follow up with your primary care physician in 1 week.  Go to your follow up appointments (listed below)   DOCTOR'S APPOINTMENT   No future appointments.   Take care and be well!  Family Medicine Teaching Service Inpatient Team [place]  [facility]  [address] [place], [place] [address] [phone]

## 2024-01-28 DIAGNOSIS — R531 Weakness: Secondary | ICD-10-CM

## 2024-01-28 LAB — CULTURE, BLOOD (SINGLE)
Culture: NO GROWTH
Special Requests: ADEQUATE

## 2024-01-28 NOTE — Assessment & Plan Note (Signed)
 Improved p.o. intake. - RD consulted, and determined severe protein-calorie malnutrition, Ensure Plus high-protein p.o. twice daily, folate and iron supplementation daily

## 2024-01-28 NOTE — Assessment & Plan Note (Signed)
 Stable, asymptomatic. Likely secondary to poor nutritional intake. S/p 2 units pRBCs. - Continue to monitor

## 2024-01-28 NOTE — TOC Progression Note (Addendum)
 Transition of Care [facility]) - Progression Note    Patient Details  Name: Jeff Parks MRN: [id number] Date of Birth: [birth year]  Transition of Care [facility]) CM/SW Contact  Lendia Dais, [place] Phone Number: 01/28/2024, 1:41 PM  Clinical Narrative: CSW spoke to the wife Jeff Parks at bedside, pt is disoriented x2.  CSW gave pt a medicare.gov lists with facilities that had accepted. Jeff Parks stated she had a preference of [facility]. CSW reached out to [facility] [place] and pending a decision.   1556 - Physiological scientist informed CSW via email that the patient did not qualify for medicaid due to the pt and his wife owning multiple properties.  CSW will continue to monitor.    Expected Discharge Plan: Skilled Nursing Facility Barriers to Discharge: Continued Medical Work up, Family Issues, Other (must enter comment) (SNF vs Home)               Expected Discharge Plan and Services In-house Referral: Clinical Social Work     Living arrangements for the past 2 months: Single Family Home                                       Social Drivers of Health (SDOH) Interventions SDOH Screenings   Food Insecurity: No Food Insecurity (01/23/2024)  Housing: Low Risk  (01/23/2024)  Transportation Needs: No Transportation Needs (01/23/2024)  Utilities: Not At Risk (01/23/2024)  Social Connections: Moderately Isolated (01/23/2024)  Tobacco Use: High Risk (01/23/2024)    Readmission Risk Interventions     No data to display

## 2024-01-28 NOTE — Progress Notes (Signed)
 Wife bedside sleeping on pull out couch

## 2024-01-28 NOTE — Assessment & Plan Note (Signed)
-   Wound care consulted. Recommended the following:  -Cleanse with NS, pat dry. Apply 1/4 thick layer of leptospermum honey to wound bed, top with dry dressing and silicone foam, change daily.

## 2024-01-28 NOTE — Assessment & Plan Note (Signed)
 CKD- no home meds, appears at baseline Hyperthyroidism- no home meds; TSH wnl

## 2024-01-28 NOTE — Assessment & Plan Note (Addendum)
 Not reporting any urinary symptoms. CTAP with moderate right and left hydronephrosis, likely 2/2 BPH. CTAP found lobulated mural mass of bladder, suspicious for primary urothelial malignancy. Has hx of recurrent UTI (recently diagnosed 7 weeks ago and treated with Keflex ). - Urology consulted, will follow-up outpatient - Finished course of IV ceftriaxone  2 g (9/25-9/29) - Blood Cx: NGTD

## 2024-01-28 NOTE — Progress Notes (Signed)
 Occupational Therapy Treatment Patient Details Name: Jeff Parks MRN: [id number] DOB: [birth year] Today's Date: 01/28/2024   History of present illness Pt is a 89 y/o M admitted on 01/23/24 after presenting with c/o weakness & AMS, endorses fall 3 days prior. Pt is being treated for anemia, UTI. PMH: AKI, anemia, hydronephrosis, constipation, foley catheter, hyperthyroidism, latent syphilis, UTI   OT comments  Patient agreeable to OT treatment and demonstrated gains with bed mobility, transfers, and self care at sink. Patient requiring cues for sequencing for self care tasks. Patient in recliner at end of session with all needs met.  Patient will benefit from continued inpatient follow up therapy, <3 hours/day.  Acute OT to continue to follow to address established goals to facilitate DC to next venue of care.        If plan is discharge home, recommend the following:  A little help with walking and/or transfers;A little help with bathing/dressing/bathroom;Assistance with cooking/housework;Assist for transportation;Direct supervision/assist for financial management;Direct supervision/assist for medications management;Help with stairs or ramp for entrance   Equipment Recommendations  BSC/3in1    Recommendations for Other Services      Precautions / Restrictions Precautions Precautions: Fall Restrictions Weight Bearing Restrictions Per Provider Order: No       Mobility Bed Mobility Overal bed mobility: Needs Assistance Bed Mobility: Supine to Sit     Supine to sit: Supervision, HOB elevated, Used rails     General bed mobility comments: cues to initiate    Transfers Overall transfer level: Needs assistance Equipment used: Rolling walker (2 wheels) Transfers: Sit to/from Stand Sit to Stand: Contact guard assist           General transfer comment: cues for hand placement and CGA for sit to stands and transfers     Balance Overall balance assessment: Needs  assistance Sitting-balance support: Feet supported Sitting balance-Leahy Scale: Fair Sitting balance - Comments: EOB   Standing balance support: During functional activity, Reliant on assistive device for balance, Single extremity supported, No upper extremity supported Standing balance-Leahy Scale: Poor Standing balance comment: able to stand for short bouts without UE support when at sink                           ADL either performed or assessed with clinical judgement   ADL Overall ADL's : Needs assistance/impaired     Grooming: Wash/dry hands;Wash/dry face;Oral care;Supervision/safety;Cueing for sequencing;Standing Grooming Details (indicate cue type and reason): at sink     Lower Body Bathing: Contact guard assist;Sit to/from stand Lower Body Bathing Details (indicate cue type and reason): peri care bathing while standing at sink         Toilet Transfer: Contact guard assist;Ambulation;Rolling walker (2 wheels) Toilet Transfer Details (indicate cue type and reason): simulated         Functional mobility during ADLs: Contact guard assist;Rolling walker (2 wheels)      Extremity/Trunk Assessment              Vision       Perception     Praxis     Communication Communication Communication: No apparent difficulties   Cognition Arousal: Alert Behavior During Therapy: WFL for tasks assessed/performed Cognition: Cognition impaired   Orientation impairments: Situation   Memory impairment (select all impairments): Short-term memory   Executive functioning impairment (select all impairments): Initiation, Problem solving OT - Cognition Comments: cues for sequencing for grooming tasks  Following commands: Impaired Following commands impaired: Follows one step commands with increased time      Cueing   Cueing Techniques: Tactile cues, Verbal cues  Exercises      Shoulder Instructions       General Comments       Pertinent Vitals/ Pain       Pain Assessment Pain Assessment: No/denies pain  Home Living                                          Prior Functioning/Environment              Frequency  Min 2X/week        Progress Toward Goals  OT Goals(current goals can now be found in the care plan section)  Progress towards OT goals: Progressing toward goals  Acute Rehab OT Goals Patient Stated Goal: none stated OT Goal Formulation: With patient Time For Goal Achievement: 02/08/24 Potential to Achieve Goals: Good ADL Goals Pt Will Perform Grooming: Independently;standing Pt Will Perform Upper Body Bathing: Independently;sitting;standing Pt Will Perform Lower Body Bathing: Independently;sit to/from stand Pt Will Perform Upper Body Dressing: Independently;sitting;standing Pt Will Perform Lower Body Dressing: Independently;sit to/from stand Pt Will Transfer to Toilet: Independently;ambulating;regular height toilet;grab bars Pt Will Perform Toileting - Clothing Manipulation and hygiene: Independently;sit to/from stand  Plan      Co-evaluation                 AM-PAC OT 6 Clicks Daily Activity     Outcome Measure   Help from another person eating meals?: None Help from another person taking care of personal grooming?: A Little Help from another person toileting, which includes using toliet, bedpan, or urinal?: A Little Help from another person bathing (including washing, rinsing, drying)?: A Little Help from another person to put on and taking off regular upper body clothing?: A Little Help from another person to put on and taking off regular lower body clothing?: A Little 6 Click Score: 19    End of Session Equipment Utilized During Treatment: Gait belt;Rolling walker (2 wheels)  OT Visit Diagnosis: Unsteadiness on feet (R26.81);Muscle weakness (generalized) (M62.81)   Activity Tolerance Patient tolerated treatment well   Patient Left in  chair;with call bell/phone within reach;with chair alarm set   Nurse Communication Mobility status        Time: 9099-9083 OT Time Calculation (min): 16 min  Charges: OT General Charges $OT Visit: 1 Visit OT Treatments $Self Care/Home Management : 8-22 mins  Dick Laine, OTA Acute Rehabilitation Services  Office [phone]   Jeb LITTIE Laine 01/28/2024, 1:25 PM

## 2024-01-28 NOTE — Progress Notes (Signed)
     Daily Progress Note Intern Pager: [phone]  Patient name: Jeff Parks Medical record number: [id number] Date of birth: [birth year] Age: 89 y.o. Gender: male  Primary Care Provider: Patient, No Pcp Per Consultants: Urology (signed off) Code Status: FULL  Pt Overview and Major Events to Date:  9/25-received 1 unit PRBC 9/25-admitted to FM TS 9/26-received 1 unit PRBC   Patient is a 89 year old male presenting with weakness and AMS, admitted with anemia and suspected UTI found to have lobulated mural mass of bladder on CTAP. Pertinent PMH/PSH includes hyperthyroidism, hx of bladder outlet obstruction, T2DM, CKD, hx of hydronephrosis.  Assessment & Plan Anemia Stable, asymptomatic. Likely secondary to poor nutritional intake. S/p 2 units pRBCs. - Continue to monitor UTI (urinary tract infection) BPH (benign prostatic hyperplasia) Not reporting any urinary symptoms. CTAP with moderate right and left hydronephrosis, likely 2/2 BPH. CTAP found lobulated mural mass of bladder, suspicious for primary urothelial malignancy. Has hx of recurrent UTI (recently diagnosed 7 weeks ago and treated with Keflex ). - Urology consulted, will follow-up outpatient - Finished course of IV ceftriaxone  2 g (9/25-9/29) - Blood Cx: NGTD Physical deconditioning Protein-calorie malnutrition, severe Improved p.o. intake. - RD consulted, and determined severe protein-calorie malnutrition, Ensure Plus high-protein p.o. twice daily, folate and iron supplementation daily Sacral wound, initial encounter - Wound care consulted. Recommended the following:  -Cleanse with NS, pat dry. Apply 1/4 thick layer of leptospermum honey to wound bed, top with dry dressing and silicone foam, change daily.  Chronic health problem CKD- no home meds, appears at baseline Hyperthyroidism- no home meds; TSH wnl  FEN/GI: Regular diet PPx: SCDs Dispo:SNF pending placement and auth  Subjective:  Patient seen at bedside. Feels  well today. No concerns or complaints. Eating breakfast, reports improved p.o. intake since admission.  Objective: Temp:  [98.4 F (36.9 C)-98.6 F (37 C)] 98.6 F (37 C) (09/29 1811) Pulse Rate:  [68-78] 78 (09/29 1811) Resp:  [18] 18 (09/29 1811) BP: (98-99)/(56-60) 99/60 (09/29 1811) SpO2:  [100 %] 100 % (09/29 1811)  Physical Exam: General: frail elderly male resting in bed, NAD Cardiovascular: regular rate and rhythm; normal S1/S2 Respiratory: clear to auscultation bilaterally; normal WOB on RA Abdomen: soft, non-distended, non-tender; normal bowel sounds Extremities: non-edematous; skin warm and dry; moving all 4 extremities equally  Laboratory: Most recent CBC Lab Results  Component Value Date   WBC 15.4 (H) 01/27/2024   HGB 8.7 (L) 01/27/2024   HCT 28.3 (L) 01/27/2024   MCV 88.2 01/27/2024   PLT 318 01/27/2024   Most recent BMP    Latest Ref Rng & Units 01/23/2024   10:47 AM  BMP  Glucose 70 - 99 mg/dL 891   BUN 8 - 23 mg/dL 36   Creatinine 9.38 - 1.24 mg/dL 8.19   Sodium 864 - 854 mmol/L 139   Potassium 3.5 - 5.1 mmol/L 4.7   Chloride 98 - 111 mmol/L 109     Imaging/Diagnostic Tests: No new imaging  Mannie Ashley SAILOR, MD 01/28/2024, 7:47 AM  PGY-1, [facility] FPTS Intern pager: [phone], text pages welcome Secure chat group [facility] Teaching Service

## 2024-01-28 NOTE — Progress Notes (Signed)
 OT to bedside, pt up and in recliner with no issues

## 2024-01-29 DIAGNOSIS — R310 Gross hematuria: Secondary | ICD-10-CM | POA: Insufficient documentation

## 2024-01-29 MED ORDER — ENSURE PLUS HIGH PROTEIN PO LIQD: 237.0000 mL | Freq: Two times a day (BID) | ORAL | Status: DC

## 2024-01-29 MED ORDER — FOLIC ACID 1 MG PO TABS: 1.0000 mg | ORAL_TABLET | Freq: Every day | ORAL | Status: DC

## 2024-01-29 MED ORDER — ADULT MULTIVITAMIN W/MINERALS CH: 1.0000 | ORAL_TABLET | Freq: Every day | ORAL | Status: DC

## 2024-01-29 MED ORDER — FERROUS SULFATE 325 (65 FE) MG PO TABS: 325.0000 mg | ORAL_TABLET | Freq: Every day | ORAL | Status: DC

## 2024-01-29 MED ORDER — MEDIHONEY WOUND/BURN DRESSING EX PSTE: 1.0000 | PASTE | Freq: Every day | CUTANEOUS | Status: DC

## 2024-01-29 NOTE — Plan of Care (Signed)
 Problem: Coping: Goal: Ability to verbalize frustrations and anger appropriately will improve Outcome: Progressing   Problem: Physical Regulation: Goal: Ability to maintain clinical measurements within normal limits will improve Outcome: Progressing   Problem: Safety: Goal: Periods of time without injury will increase Outcome: Progressing

## 2024-01-29 NOTE — Progress Notes (Signed)
 Physical Therapy Treatment Patient Details Name: Jeff Parks MRN: [id number] DOB: 1929/05/28 Today's Date: 01/29/2024   History of Present Illness Pt is a 89 y/o M admitted on 01/23/24 after presenting with c/o weakness & AMS, endorses fall 3 days prior. Pt is being treated for anemia, UTI. PMH: AKI, anemia, hydronephrosis, constipation, foley catheter, hyperthyroidism, latent syphilis, UTI    PT Comments  Pt received in chair, restless and attempting to stand unassisted with chair alarm sounding, clearance for session from pt spouse and RN, pt participatory as able. Pt needing up to minA for sit<>stand transfers x2 from chair and gait with RW due to poor L sided awareness with pt bumping into objects on L side of RW and unable to manage AD on his own. Noted low BP in standing, RN/MD notified once pt safely in supine, could try abdominal or BLE compression to see if BP more stable, pt asymptomatic but appears quick to fatigue. Defer stair trial today due to pt hypotension in standing. Patient will benefit from continued inpatient follow up therapy, <3 hours/day   Vital Signs  Patient Position (if appropriate) Orthostatic Vitals  Orthostatic Sitting  BP- Sitting 95/51 (64)  Pulse- Sitting 89  Orthostatic Standing at 0 minutes  BP- Standing at 0 minutes (!) 78/45 (54)  Pulse- Standing at 0 minutes 106   Orthostatic Lying   BP- Lying 90/45 (58) sidelying to his R, taken in LUE with arm resting on pillow  Pulse- Lying 87     If plan is discharge home, recommend the following: Assistance with cooking/housework;Assistance with feeding;A lot of help with walking and/or transfers;A lot of help with bathing/dressing/bathroom;Direct supervision/assist for financial management;Supervision due to cognitive status;Direct supervision/assist for medications management;Help with stairs or ramp for entrance;Assist for transportation   Can travel by private vehicle     Yes  Equipment Recommendations   BSC/3in1;Rolling walker (2 wheels)    Recommendations for Other Services       Precautions / Restrictions Precautions Precautions: Fall Recall of Precautions/Restrictions: Impaired Precaution/Restrictions Comments: very impulsive; BP very low in standing Restrictions Weight Bearing Restrictions Per Provider Order: No     Mobility  Bed Mobility Overal bed mobility: Needs Assistance Bed Mobility: Sit to Supine, Rolling Rolling: Min assist, Used rails     Sit to supine: Contact guard assist, Used rails   General bed mobility comments: received up in chair; Poor technique with return to supine via quadruped position facing mattress, despite multimodal cues from PTA to turn and sit before return to sidelying. Pt rolling with minA to remove gait belt after return to sidelying/supine.    Transfers Overall transfer level: Needs assistance Equipment used: Rolling walker (2 wheels) Transfers: Sit to/from Stand Sit to Stand: Min assist           General transfer comment: from chair<>RW, attempts with CGA initially but needs minA and safety cues to achieve full upright posture at RW.    Ambulation/Gait Ambulation/Gait assistance: Min assist Gait Distance (Feet): 20 Feet Assistive device: Rolling walker (2 wheels) Gait Pattern/deviations: Decreased step length - right, Decreased step length - left, Decreased dorsiflexion - right, Decreased dorsiflexion - left, Decreased stride length Gait velocity: decreased Gait velocity interpretation: <1.8 ft/sec, indicate of risk for recurrent falls   General Gait Details: Pt ambulated from one side of the room and back to the other. Pt requiring heavy cuing/assistance for RW management (obstacle avoidance, ambulating within base of AD, steering) and tending to bump into furniture on L side  of RW in narrow areas of the room consistently. BP low standing prior to trial but pt reports he is asymptomatic, RN/MD notified. Pt denies fatigue during  trial but appears to be falling asleep quickly once back in supine.   Stairs Stairs:  (not safe to attempt this session due to MAP (54) standing at RW)           Wheelchair Mobility     Tilt Bed    Modified Rankin (Stroke Patients Only)       Balance Overall balance assessment: Needs assistance Sitting-balance support: Feet supported Sitting balance-Leahy Scale: Fair Sitting balance - Comments: EOB   Standing balance support: During functional activity, Reliant on assistive device for balance, Single extremity supported, Bilateral upper extremity supported Standing balance-Leahy Scale: Poor Standing balance comment: RW vs furniture and HHA                            Communication Communication Communication: No apparent difficulties  Cognition Arousal: Alert Behavior During Therapy: Impulsive, Flat affect   PT - Cognitive impairments: Orientation, Awareness, Memory, Problem solving, Safety/Judgement, Sequencing   Orientation impairments: Time, Situation                   PT - Cognition Comments: Pt participatory with encouragement, denies symptoms of low BP in standing, but self-limited with standing activity past few days. Pt attempting to stand up unassisted with chair alarm sounding when PTA arrived to his room. Pt ignores some cues from PTA for sequencing/safer body mechanics, transfers from standing to bed via quadruped position facing mattress despite cues to turn and sit down first. Pt appears HoH. Following commands: Impaired Following commands impaired: Follows one step commands with increased time, Follows one step commands inconsistently    Cueing Cueing Techniques: Tactile cues, Verbal cues, Gestural cues  Exercises      General Comments General comments (skin integrity, edema, etc.): see BP in comments above; RN/MD notified of pt low BP sitting/standing, recommend trial of BLE compression or abdominal binder if indicated to build  tolerance for standing activity.      Pertinent Vitals/Pain Pain Assessment Pain Assessment: No/denies pain    Home Living                          Prior Function            PT Goals (current goals can now be found in the care plan section) Acute Rehab PT Goals Patient Stated Goal: get better PT Goal Formulation: With patient/family Time For Goal Achievement: 02/07/24 Progress towards PT goals: Progressing toward goals    Frequency    Min 3X/week      PT Plan      Co-evaluation              AM-PAC PT 6 Clicks Mobility   Outcome Measure  Help needed turning from your back to your side while in a flat bed without using bedrails?: A Little Help needed moving from lying on your back to sitting on the side of a flat bed without using bedrails?: A Little Help needed moving to and from a bed to a chair (including a wheelchair)?: A Little Help needed standing up from a chair using your arms (e.g., wheelchair or bedside chair)?: A Little Help needed to walk in hospital room?: A Lot Help needed climbing 3-5 steps with a railing? : Total 6  Click Score: 15    End of Session Equipment Utilized During Treatment: Gait belt Activity Tolerance: Patient tolerated treatment well;Patient limited by fatigue;Other (comment) (soft BP standing) Patient left: in bed;with call bell/phone within reach;with bed alarm set;Other (comment) (x4 rails up per patient request, RN notified) Nurse Communication: Mobility status;Precautions;Other (comment) (BP readings; pt requesting both side rails up since he wants to remain in sidelying when PTA asked) PT Visit Diagnosis: Unsteadiness on feet (R26.81);Muscle weakness (generalized) (M62.81);Other abnormalities of gait and mobility (R26.89);Difficulty in walking, not elsewhere classified (R26.2)     Time: 8657-8643 PT Time Calculation (min) (ACUTE ONLY): 14 min  Charges:    $Therapeutic Activity: 8-22 mins PT General  Charges $$ ACUTE PT VISIT: 1 Visit                     Mikeal Winstanley P., PTA Acute Rehabilitation Services Secure Chat Preferred 9a-5:30pm Office: [phone]    Connell HERO [facility] 01/29/2024, 4:20 PM

## 2024-01-29 NOTE — Assessment & Plan Note (Signed)
-   Wound care consulted. Recommended the following:  -Cleanse with NS, pat dry. Apply 1/4 thick layer of leptospermum honey to wound bed, top with dry dressing and silicone foam, change daily.

## 2024-01-29 NOTE — Assessment & Plan Note (Signed)
 Not reporting any urinary symptoms. CTAP with moderate right and left hydronephrosis, likely 2/2 BPH. CTAP found lobulated mural mass of bladder, suspicious for primary urothelial malignancy. Has hx of recurrent UTI (recently diagnosed 7 weeks ago and treated with Keflex ). - Urology consulted, will follow-up outpatient - Finished course of IV ceftriaxone  2 g (9/25-9/29) - Blood Cx: NGTD

## 2024-01-29 NOTE — Discharge Summary (Addendum)
 Family Medicine Teaching [facility] Discharge Summary  Patient name: Jeff Parks Medical record number: [id number] Date of birth: 1929/06/17 Age: 89 y.o. Gender: male Date of Admission: 01/23/2024  Date of Discharge: 01/30/2024 Admitting Physician: Fairy Amy, MD  Primary Care Provider: Patient, No Pcp Per Consultants: Urology  Indication for Hospitalization: Anemia, gross hematuria  Discharge Diagnoses/Problem List:  Principal Problem for Admission: Anemia, UTI Other Problems addressed during stay:  Principal Problem:   Anemia Active Problems:   Generalized weakness   UTI (urinary tract infection)   Chronic health problem   Physical deconditioning   Sacral wound, initial encounter   BPH (benign prostatic hyperplasia)   Protein-calorie malnutrition, severe   Gross hematuria  Brief Hospital Course:  Jeff Parks is a 89 y.o. male with a past history of hyperthyroidism, hx of bladder outlet obstruction, T2DM, CKD, hx of hydronephrosis who was admitted to the [facility] Medicine Teaching Service at [facility] for symptomatic anemia. Hospital course is outlined below by problem.   Symptomatic anemia Altered mental status Fall Presented with weakness and hemoglobin of 6.5 in the setting of reported pus and blood in his adult brief. CT head with no acute abnormality. FOBT negative for bleed. 2 units of pRBC was transfused. At time of discharge, patient was asymptomatic and hemoglobin was stable.  UTI BPH Gross hematuria On admission, patient found to have pus and blood in his adult brief as reported above as well as thick and foul-smelling urine. UA suggestive of UTI, started on IV Ceftriaxone . CTAP with moderate right and left hydronephrosis, likely secondary to BPH. UC no growth, completed empiric antibiotic course.  Mass in bladder CTAP significant for 2.1 x 2.2 x 4.8 cm lobulated mural mass involving the left posterolateral base of the bladder, suspicious for primary urothelial  malignancy. Urology was consulted, and they recommended outpatient follow-up for further evaluation including cystoscopy and biopsy of mass.  Severe malnutrition Hypoalbuminemia Likely occurred in the setting of decreased p.o. intake with UTI and overall chronic illness and chronic protein calorie malnutrition. Nutrition was consulted and recommended Ensure twice daily, daily multivitamin, iron and folic acid  supplementation.  Sacral wound Wound care consulted. Recommended daily cleanse with NS, application of 1/4 thick layer of leptospermum honey to wound bed, top with dry dressing and silicone foam, change daily.   Other conditions that were chronic and stable: CKD, T2DM, Hyperthyroidism  Issues for follow up: Follow up with urology outpatient for cystoscopy and procedural workup Order anemia studies given transfused in the hospital    Results/Tests Pending at Time of Discharge:  Unresulted Labs (From admission, onward)    None        Disposition: SNF  Discharge Condition: Stable  Discharge Exam:  Vitals:   01/30/24 0359 01/30/24 0843  BP: (!) 93/50 (!) 96/52  Pulse: 86 78  Resp: 17 18  Temp: 98.3 F (36.8 C) 98 F (36.7 C)  SpO2:  98%   General: Lying in bed. NAD CV: RRR without murmur RESP: Normal work of breathing on room air.  CTAB Abdomen: Soft, nondistended. GU: Pure wick in place, yellow urine output without distinct hematuria. Ext: No peripheral edema  Significant Procedures: None  Significant Labs and Imaging:  No results for input(s): WBC, HGB, HCT, PLT in the last 48 hours. No results for input(s): NA, K, CL, CO2, GLUCOSE, BUN, CREATININE, CALCIUM , MG, PHOS, ALKPHOS, AST, ALT, ALBUMIN, PROTEIN in the last 48 hours.  Pertinent Imaging: CT ABDOMEN PELVIS WO CONTRAST Result Date: 01/23/2024 IMPRESSION: 1.  Moderate right hydronephrosis, possibly related to extrinsic compression by a 6.7 x 11.1 x 10.0 cm exophytic  simple cortical cyst arising from the lower pole of the right kidney. 2. Moderate left hydronephrosis. Circumferentially thick-walled bladder with a 2.1 x 3.3 x 4.8 cm lobulated mural mass involving the left posterolateral base, suspicious for a primary urothelial malignancy. 3. Extensive perivesicular inflammatory stranding may relate to superimposed infectious or inflammatory cystitis. Moderate gas within the bladder lumen is nonspecific and may relate to recent catheterization, aggressive infection, or an underlying enteric fistula. Correlation with urinalysis and urine culture may be helpful for further management. 4. Cholelithiasis without superimposed pericholecystic inflammatory change. No intra or extrahepatic biliary ductal dilation. 5. Mild sigmoid diverticulosis without evidence of diverticulitis.   DG Chest Portable 1 View Result Date: 01/23/2024 IMPRESSION: No active disease.   CT Head Wo Contrast Result Date: 01/23/2024 IMPRESSION: 1. No evidence of acute intracranial abnormality. 2. Mild chronic small vessel ischemic disease.      Discharge Medications:  Allergies as of 01/30/2024   No Known Allergies      Medication List     TAKE these medications    acetaminophen  500 MG tablet Commonly known as: TYLENOL  Take 500-1,000 mg by mouth every 6 (six) hours as needed for moderate pain (pain score 4-6).   feeding supplement Liqd Take 237 mLs by mouth 2 (two) times daily between meals.   ferrous sulfate  325 (65 FE) MG tablet Take 1 tablet (325 mg total) by mouth daily with breakfast.   folic acid  1 MG tablet Commonly known as: FOLVITE  Take 1 tablet (1 mg total) by mouth daily.   leptospermum manuka honey Pste paste Apply 1 Application topically daily.   multivitamin with minerals Tabs tablet Take 1 tablet by mouth daily.               Discharge Care Instructions  (From admission, onward)           Start     Ordered   01/29/24 0000  Discharge wound care:        Comments: Cleanse with NS, pat dry.  Apply 1/4 thick layer of leptospermum honey to wound bed,  top with dry dressing and silicone foam, change daily.   01/29/24 1246            Discharge Instructions: Please refer to Patient Instructions section of EMR for full details.  Patient was counseled important signs and symptoms that should prompt return to medical care, changes in medications, dietary instructions, activity restrictions, and follow up appointments.   Follow-Up Appointments:  Contact information for after-discharge care     Destination     [facility] .   Service: Skilled Nursing Contact information: [address] [place]  [address] [phone]                     Cleotilde Lukes, DO 01/30/2024, 10:36 AM PGY-2, [facility]

## 2024-01-29 NOTE — Assessment & Plan Note (Addendum)
 Stable, asymptomatic. Likely secondary to poor nutritional intake. S/p 2 units pRBCs. - Hgb 8.7 on 10/1

## 2024-01-29 NOTE — Assessment & Plan Note (Signed)
 CKD- no home meds, appears at baseline Hyperthyroidism- no home meds; TSH wnl

## 2024-01-29 NOTE — TOC Progression Note (Signed)
 Transition of Care [facility]) - Progression Note    Patient Details  Name: Jeff Parks MRN: [id number] Date of Birth: 01/15/1930  Transition of Care [facility]) CM/SW Contact  Lendia Dais, [place] Phone Number: 01/29/2024, 11:27 AM  Clinical Narrative:  Pt is disoriented x2. Pt and pt's wife Delayne chose [facility].  CSW spoke to [facility] and stated that a bed won't be available until tomorrow.   CSW left a VM for [facility].     Expected Discharge Plan: Skilled Nursing Facility Barriers to Discharge: Continued Medical Work up, Family Issues, Other (must enter comment) (SNF vs Home)               Expected Discharge Plan and Services In-house Referral: Clinical Social Work     Living arrangements for the past 2 months: Single Family Home                                       Social Drivers of Health (SDOH) Interventions SDOH Screenings   Food Insecurity: No Food Insecurity (01/23/2024)  Housing: Low Risk  (01/23/2024)  Transportation Needs: No Transportation Needs (01/23/2024)  Utilities: Not At Risk (01/23/2024)  Social Connections: Moderately Isolated (01/23/2024)  Tobacco Use: High Risk (01/23/2024)    Readmission Risk Interventions     No data to display

## 2024-01-29 NOTE — Progress Notes (Signed)
 Daily Progress Note Intern Pager: [phone]  Patient name: Jeff Parks Medical record number: [id number] Date of birth: [birth year] Age: 89 y.o. Gender: male  Primary Care Provider: Patient, No Pcp Per Consultants: Urology (signed off) Code Status: FULL  Pt Overview and Major Events to Date:  9/25-received 1 unit PRBC 9/25-admitted to [facility] TS 9/26-received 1 unit PRBC  Assessment and Plan:  Patient is a 89 year old male presenting with weakness and AMS, admitted with anemia and suspected UTI found to have lobulated mural mass of bladder on CTAP. Pertinent PMH/PSH includes hyperthyroidism, hx of bladder outlet obstruction, T2DM, CKD, hx of hydronephrosis.   Pt appears to be doing well today, and we are working to obtain a safe discharge plan. Currently pending response from SNF [facility] [place]. Assessment & Plan Anemia Stable, asymptomatic. Likely secondary to poor nutritional intake. S/p 2 units pRBCs. - Hgb 8.7 on 10/1 UTI (urinary tract infection) BPH (benign prostatic hyperplasia) Gross hematuria Not reporting any urinary symptoms. CTAP with moderate right and left hydronephrosis, likely 2/2 BPH. CTAP found lobulated mural mass of bladder, suspicious for primary urothelial malignancy. Has hx of recurrent UTI (recently diagnosed 7 weeks ago and treated with Keflex ). - Urology consulted, will follow-up outpatient - Finished course of IV ceftriaxone  2 g (9/25-9/29) - Blood Cx: NGTD Chronic health problem CKD- no home meds, appears at baseline Hyperthyroidism- no home meds; TSH wnl Sacral wound, initial encounter - Wound care consulted. Recommended the following:             -Cleanse with NS, pat dry. Apply 1/4 thick layer of leptospermum honey to wound bed, top with dry dressing and silicone foam, change daily.  Protein-calorie malnutrition, severe Determined by Dietician - Ensure supplementation BID    FEN/GI: Regular diet w/ Ensure supplementation BID PPx:  SCDs Dispo: SNF pending placement and authorization, awaiting response from SNF [facility] [place]. Does not qualify for medicaid.  Subjective:   No acute events overnight. Medication compliant w/ no PRNs required. Vital Signs Stable. BP remains with MAP stable in 60s. Pt appetite is improved. Spoke with patient and wife, and plan is still for SNF placement.  Objective: Temp:  [98 F (36.7 C)-99.4 F (37.4 C)] 98.4 F (36.9 C) (10/01 0600) Pulse Rate:  [78-100] 78 (10/01 0600) Resp:  [17-18] 18 (10/01 0400) BP: (90-107)/(50-76) 98/57 (10/01 0600) SpO2:  [100 %] 100 % (10/01 0600) Physical Exam: Physical Exam Vitals and nursing note reviewed. Exam conducted with a chaperone present.  Constitutional:      General: He is not in acute distress.    Appearance: He is not toxic-appearing.  Cardiovascular:     Rate and Rhythm: Normal rate and regular rhythm.     Pulses: Normal pulses.     Heart sounds: No murmur heard. Pulmonary:     Effort: Pulmonary effort is normal. No respiratory distress.     Breath sounds: Normal breath sounds. No stridor. No wheezing or rhonchi.  Abdominal:     General: There is no distension.     Palpations: Abdomen is soft.     Tenderness: There is no abdominal tenderness.  Musculoskeletal:     Right lower leg: No edema.     Left lower leg: No edema.  Skin:    General: Skin is warm and dry.  Neurological:     Mental Status: He is alert.      Laboratory: Most recent CBC Lab Results  Component Value Date   WBC 15.4 (H)  01/27/2024   HGB 8.7 (L) 01/27/2024   HCT 28.3 (L) 01/27/2024   MCV 88.2 01/27/2024   PLT 318 01/27/2024   Most recent BMP    Latest Ref Rng & Units 01/23/2024   10:47 AM  BMP  Glucose 70 - 99 mg/dL 891   BUN 8 - 23 mg/dL 36   Creatinine 9.38 - 1.24 mg/dL 8.19   Sodium 864 - 854 mmol/L 139   Potassium 3.5 - 5.1 mmol/L 4.7   Chloride 98 - 111 mmol/L 109     Other pertinent labs: n/a  Imaging/Diagnostic Tests: Radiologist  Impression: No results found. My interpretation: n/a Lera Nancyann KATHEE ROSALEA 01/29/2024, 8:25 AM  PGY-1, [facility] Health Family Medicine FPTS Intern pager: [phone], text pages welcome Secure chat group [facility] Teaching Service

## 2024-01-29 NOTE — Assessment & Plan Note (Signed)
 Determined by Dietician - Ensure supplementation BID

## 2024-01-29 NOTE — Plan of Care (Signed)
   Problem: Activity: Goal: Risk for activity intolerance will decrease Outcome: Progressing

## 2024-01-30 NOTE — TOC Transition Note (Signed)
 Transition of Care [facility]) - Discharge Note   Patient Details  Name: Jeff Parks MRN: [id number] Date of Birth: August 04, 1929  Transition of Care [facility]) CM/SW Contact:  Lendia Dais, LCSWA Phone Number: 01/30/2024, 11:21 AM   Clinical Narrative:  Pt will discharge to [facility]. RN report to [phone]. CSW spoke to wife at bedside, pt will be transported by family.  No further TOC/ICM (inpatient case management) needs.    Final next level of care: Skilled Nursing Facility Barriers to Discharge: Barriers Resolved   Patient Goals and CMS Choice   CMS Medicare.gov Compare Post Acute Care list provided to:: Patient Represenative (must comment) (Wife) Choice offered to / list presented to : Spouse      Discharge Placement              Patient chooses bed at: [facility] Patient to be transferred to facility by: Private vehicle/Family Name of family member notified: Delayne (spouse) Patient and family notified of of transfer: 01/30/24  Discharge Plan and Services Additional resources added to the After Visit Summary for   In-house Referral: Clinical Social Work                                   Social Drivers of Health (SDOH) Interventions SDOH Screenings   Food Insecurity: No Food Insecurity (01/23/2024)  Housing: Low Risk  (01/23/2024)  Transportation Needs: No Transportation Needs (01/23/2024)  Utilities: Not At Risk (01/23/2024)  Social Connections: Moderately Isolated (01/23/2024)  Tobacco Use: High Risk (01/23/2024)     Readmission Risk Interventions     No data to display

## 2024-01-30 NOTE — Plan of Care (Signed)

## 2024-01-30 NOTE — Assessment & Plan Note (Signed)
 CKD- no home meds, appears at baseline Hyperthyroidism- no home meds; TSH wnl

## 2024-01-30 NOTE — Assessment & Plan Note (Signed)
 Not reporting any urinary symptoms. CTAP with moderate right and left hydronephrosis, likely 2/2 BPH. CTAP found lobulated mural mass of bladder, suspicious for primary urothelial malignancy. Has hx of recurrent UTI (recently diagnosed 7 weeks ago and treated with Keflex ). - Urology consulted, will follow-up outpatient - Finished course of IV ceftriaxone  2 g (9/25-9/29) - Blood Cx: NGTD

## 2024-01-30 NOTE — Progress Notes (Signed)
 Occupational Therapy Treatment Patient Details Name: Jeff Parks MRN: [id number] DOB: June 28, 1929 Today's Date: 01/30/2024   History of present illness Pt is a 89 y/o M admitted on 01/23/24 after presenting with c/o weakness & AMS, endorses fall 3 days prior. Pt is being treated for anemia, UTI. PMH: AKI, anemia, hydronephrosis, constipation, foley catheter, hyperthyroidism, latent syphilis, UTI   OT comments  Patient making good gains with OT treatment. Patient performed LB dressing with donning pull up brief with min assist and min assist to donn bathrobe while standing. Patient performed sit to stands with min assist and CGA for transfers and mobility.  Patient will benefit from continued inpatient follow up therapy, <3 hours/day. Acute OT to continue to follow to address established goals to facilitate DC to next venue of care.        If plan is discharge home, recommend the following:  A little help with walking and/or transfers;A little help with bathing/dressing/bathroom;Assistance with cooking/housework;Assist for transportation;Direct supervision/assist for financial management;Direct supervision/assist for medications management;Help with stairs or ramp for entrance   Equipment Recommendations  BSC/3in1    Recommendations for Other Services      Precautions / Restrictions Precautions Precautions: Fall Recall of Precautions/Restrictions: Impaired Precaution/Restrictions Comments: very impulsive; BP very low in standing Restrictions Weight Bearing Restrictions Per Provider Order: No       Mobility Bed Mobility Overal bed mobility: Needs Assistance Bed Mobility: Sit to Supine     Supine to sit: Supervision, HOB elevated, Used rails     General bed mobility comments: cues to initiate and use of rail    Transfers Overall transfer level: Needs assistance Equipment used: Rolling walker (2 wheels) Transfers: Sit to/from Stand Sit to Stand: Min assist            General transfer comment: cues for hand placement and min assist to power up and CGA for transfers     Balance Overall balance assessment: Needs assistance Sitting-balance support: Feet supported Sitting balance-Leahy Scale: Fair Sitting balance - Comments: EOB   Standing balance support: During functional activity, Reliant on assistive device for balance, Single extremity supported, Bilateral upper extremity supported Standing balance-Leahy Scale: Poor Standing balance comment: reliant on external support for balanc e                           ADL either performed or assessed with clinical judgement   ADL Overall ADL's : Needs assistance/impaired                 Upper Body Dressing : Minimal assistance;Standing Upper Body Dressing Details (indicate cue type and reason): donning bath robe while standing Lower Body Dressing: Minimal assistance;Sit to/from stand Lower Body Dressing Details (indicate cue type and reason): assistance with pull up brief Toilet Transfer: Contact guard assist;Ambulation;Rolling walker (2 wheels) Toilet Transfer Details (indicate cue type and reason): simulated           General ADL Comments: impulsive with sit to stands    Extremity/Trunk Assessment              Vision       Perception     Praxis     Communication Communication Communication: No apparent difficulties   Cognition Arousal: Alert Behavior During Therapy: Impulsive, Flat affect Cognition: Cognition impaired   Orientation impairments: Situation   Memory impairment (select all impairments): Short-term memory   Executive functioning impairment (select all impairments): Initiation, Problem solving  Following commands: Impaired Following commands impaired: Follows one step commands with increased time, Follows one step commands inconsistently      Cueing   Cueing Techniques: Tactile cues, Verbal cues, Gestural cues   Exercises      Shoulder Instructions       General Comments Patient's wife present and supportive and encouraged patient    Pertinent Vitals/ Pain       Pain Assessment Pain Assessment: No/denies pain  Home Living                                          Prior Functioning/Environment              Frequency  Min 2X/week        Progress Toward Goals  OT Goals(current goals can now be found in the care plan section)  Progress towards OT goals: Progressing toward goals  Acute Rehab OT Goals Patient Stated Goal: to eat ice cream OT Goal Formulation: With patient Time For Goal Achievement: 02/08/24 Potential to Achieve Goals: Good ADL Goals Pt Will Perform Grooming: Independently;standing Pt Will Perform Upper Body Bathing: Independently;sitting;standing Pt Will Perform Lower Body Bathing: Independently;sit to/from stand Pt Will Perform Upper Body Dressing: Independently;sitting;standing Pt Will Perform Lower Body Dressing: Independently;sit to/from stand Pt Will Transfer to Toilet: Independently;ambulating;regular height toilet;grab bars Pt Will Perform Toileting - Clothing Manipulation and hygiene: Independently;sit to/from stand  Plan      Co-evaluation                 AM-PAC OT 6 Clicks Daily Activity     Outcome Measure   Help from another person eating meals?: None Help from another person taking care of personal grooming?: A Little Help from another person toileting, which includes using toliet, bedpan, or urinal?: A Little Help from another person bathing (including washing, rinsing, drying)?: A Little Help from another person to put on and taking off regular upper body clothing?: A Little Help from another person to put on and taking off regular lower body clothing?: A Little 6 Click Score: 19    End of Session Equipment Utilized During Treatment: Gait belt;Rolling walker (2 wheels)  OT Visit Diagnosis: Unsteadiness on  feet (R26.81);Muscle weakness (generalized) (M62.81)   Activity Tolerance Patient tolerated treatment well   Patient Left in chair;with call bell/phone within reach;with chair alarm set;with family/visitor present   Nurse Communication Mobility status        Time: 9192-9166 OT Time Calculation (min): 26 min  Charges: OT General Charges $OT Visit: 1 Visit OT Treatments $Self Care/Home Management : 8-22 mins $Therapeutic Activity: 8-22 mins  Dick Laine, OTA Acute Rehabilitation Services  Office [phone]   Jeb LITTIE Laine 01/30/2024, 12:07 PM

## 2024-01-30 NOTE — Progress Notes (Signed)
 DISCHARGE NOTE HOME Jeff Parks to be discharged to [facility] MD order and report called to [place]. Discussed prescriptions and follow up appointments with the patient. Prescriptions given to wife; medication list explained in detail. Patient verbalized understanding.  Skin clean, dry and intact. Dressing changed to sacrum. IV catheter discontinued intact. Site without signs and symptoms of complications. Dressing and pressure applied. Pt denies pain at the site currently. No complaints noted.  Patient free of lines, drains, and wounds.   An After Visit Summary (AVS) was printed and given to the patient. Patient escorted via wheelchair, and discharged to SNF via private auto.  Wife packed belongings.   Anaih Brander A Proctor-Gann, RN

## 2024-01-30 NOTE — Assessment & Plan Note (Signed)
 Stable, asymptomatic. Likely secondary to poor nutritional intake. S/p 2 units pRBCs. - Hgb 8.7 on 9/29

## 2024-01-30 NOTE — Progress Notes (Signed)
 Daily Progress Note Intern Pager: [phone]  Patient name: Jeff Parks Medical record number: [id number] Date of birth: 07-Oct-1929 Age: 89 y.o. Gender: male  Primary Care Provider: Patient, No Pcp Per Consultants: Urology (signed off) Code Status: Full  Pt Overview and Major Events to Date:  9/25-received 1 unit PRBC 9/25-admitted to [facility] 9/26-received 1 unit PRBC  Assessment and Plan:  Patient is a 89 year old male presenting with weakness and AMS, admitted with anemia and suspected UTI found to have lobulated mural mass of bladder on CTAP. Pertinent PMH/PSH includes hyperthyroidism, hx of bladder outlet obstruction, T2DM, CKD, hx of hydronephrosis.    Pt appears to be doing well today, and we are working at discharge to [facility] when there is availability. Concern for darker stool is likely related to iron supplementation. Vitals are unchanged from chronic lower levels, and pt appears clinically improved.  Assessment & Plan Anemia Stable, asymptomatic. Likely secondary to poor nutritional intake. S/p 2 units pRBCs. - Hgb 8.7 on 9/29 UTI (urinary tract infection) BPH (benign prostatic hyperplasia) Gross hematuria Not reporting any urinary symptoms. CTAP with moderate right and left hydronephrosis, likely 2/2 BPH. CTAP found lobulated mural mass of bladder, suspicious for primary urothelial malignancy. Has hx of recurrent UTI (recently diagnosed 7 weeks ago and treated with Keflex ). - Urology consulted, will follow-up outpatient - Finished course of IV ceftriaxone  2 g (9/25-9/29) - Blood Cx: NGTD Chronic health problem CKD- no home meds, appears at baseline Hyperthyroidism- no home meds; TSH wnl Sacral wound, initial encounter - Wound care consulted. Recommended the following:             -Cleanse with NS, pat dry. Apply 1/4 thick layer of leptospermum honey to wound bed, top with dry dressing and silicone foam, change daily.  Protein-calorie malnutrition,  severe Determined by Dietician - Ensure supplementation BID    FEN/GI: Regular diet w/ Ensure supplementation BID PPx: SCDs Dispo: SNF pending bed at [facility]  Subjective:   Vital Signs Stable. Pt clinically looks well and was seen with OT in the room. Concern was by wife for darker stools. Pt feels well for discharge to SNF.  Objective: Temp:  [98 F (36.7 C)-99.9 F (37.7 C)] 98 F (36.7 C) (10/02 0843) Pulse Rate:  [78-99] 78 (10/02 0843) Resp:  [17-18] 18 (10/02 0843) BP: (89-99)/(44-52) 96/52 (10/02 0843) SpO2:  [98 %-100 %] 98 % (10/02 0843) Physical Exam Vitals and nursing note reviewed. Exam conducted with a chaperone present.  Constitutional:      General: He is not in acute distress.    Appearance: He is not toxic-appearing.  Cardiovascular:     Rate and Rhythm: Normal rate and regular rhythm.     Pulses: Normal pulses.     Heart sounds: No murmur heard. Pulmonary:     Effort: Pulmonary effort is normal. No respiratory distress.     Breath sounds: Normal breath sounds. No stridor. No wheezing or rhonchi.  Abdominal:     General: There is no distension.     Palpations: Abdomen is soft.     Tenderness: There is no abdominal tenderness.  Musculoskeletal:     Right lower leg: No edema.     Left lower leg: No edema.  Skin:    General: Skin is warm and dry.  Neurological:     Mental Status: He is alert.      Laboratory: Most recent CBC Lab Results  Component Value Date   WBC 15.4 (H)  01/27/2024   HGB 8.7 (L) 01/27/2024   HCT 28.3 (L) 01/27/2024   MCV 88.2 01/27/2024   PLT 318 01/27/2024   Most recent BMP    Latest Ref Rng & Units 01/23/2024   10:47 AM  BMP  Glucose 70 - 99 mg/dL 891   BUN 8 - 23 mg/dL 36   Creatinine 9.38 - 1.24 mg/dL 8.19   Sodium 864 - 854 mmol/L 139   Potassium 3.5 - 5.1 mmol/L 4.7   Chloride 98 - 111 mmol/L 109     Other pertinent labs: n/a  Imaging/Diagnostic Tests: Radiologist Impression: No results found.  My  interpretation: n/a Lera Nancyann KATHEE ROSALEA 01/30/2024, 9:37 AM  PGY-1, [facility] Health Family Medicine FPTS Intern pager: [phone], text pages welcome Secure chat group [facility] Teaching Service

## 2024-01-30 NOTE — Assessment & Plan Note (Signed)
-   Wound care consulted. Recommended the following:             -Cleanse with NS, pat dry. Apply 1/4 thick layer of leptospermum honey to wound bed, top with dry dressing and silicone foam, change daily.

## 2024-01-30 NOTE — Plan of Care (Signed)
 Pt refused turns and mobility despite multiple attempts by this RN and wife who encouraged mobility.    Problem: Education: Goal: Knowledge of [facility] Education information/materials will improve Outcome: Progressing Goal: Emotional status will improve Outcome: Progressing Goal: Mental status will improve Outcome: Progressing Goal: Verbalization of understanding the information provided will improve Outcome: Progressing   Problem: Activity: Goal: Interest or engagement in activities will improve Outcome: Progressing Goal: Sleeping patterns will improve Outcome: Progressing   Problem: Coping: Goal: Ability to verbalize frustrations and anger appropriately will improve Outcome: Progressing Goal: Ability to demonstrate self-control will improve Outcome: Progressing   Problem: Health Behavior/Discharge Planning: Goal: Identification of resources available to assist in meeting health care needs will improve Outcome: Progressing Goal: Compliance with treatment plan for underlying cause of condition will improve Outcome: Progressing   Problem: Physical Regulation: Goal: Ability to maintain clinical measurements within normal limits will improve Outcome: Progressing   Problem: Safety: Goal: Periods of time without injury will increase Outcome: Progressing   Problem: Education: Goal: Knowledge of General Education information will improve Description: Including pain rating scale, medication(s)/side effects and non-pharmacologic comfort measures Outcome: Progressing   Problem: Health Behavior/Discharge Planning: Goal: Ability to manage health-related needs will improve Outcome: Progressing   Problem: Clinical Measurements: Goal: Ability to maintain clinical measurements within normal limits will improve Outcome: Progressing Goal: Will remain free from infection Outcome: Progressing Goal: Diagnostic test results will improve Outcome: Progressing Goal:  Respiratory complications will improve Outcome: Progressing Goal: Cardiovascular complication will be avoided Outcome: Progressing   Problem: Activity: Goal: Risk for activity intolerance will decrease Outcome: Progressing   Problem: Nutrition: Goal: Adequate nutrition will be maintained Outcome: Progressing   Problem: Coping: Goal: Level of anxiety will decrease Outcome: Progressing   Problem: Elimination: Goal: Will not experience complications related to bowel motility Outcome: Progressing Goal: Will not experience complications related to urinary retention Outcome: Progressing   Problem: Pain Managment: Goal: General experience of comfort will improve and/or be controlled Outcome: Progressing   Problem: Safety: Goal: Ability to remain free from injury will improve Outcome: Progressing   Problem: Skin Integrity: Goal: Risk for impaired skin integrity will decrease Outcome: Progressing   Problem: Education: Goal: Ability to describe self-care measures that may prevent or decrease complications (Diabetes Survival Skills Education) will improve Outcome: Progressing Goal: Individualized Educational Video(s) Outcome: Progressing   Problem: Coping: Goal: Ability to adjust to condition or change in health will improve Outcome: Progressing   Problem: Fluid Volume: Goal: Ability to maintain a balanced intake and output will improve Outcome: Progressing   Problem: Health Behavior/Discharge Planning: Goal: Ability to identify and utilize available resources and services will improve Outcome: Progressing Goal: Ability to manage health-related needs will improve Outcome: Progressing   Problem: Metabolic: Goal: Ability to maintain appropriate glucose levels will improve Outcome: Progressing   Problem: Nutritional: Goal: Maintenance of adequate nutrition will improve Outcome: Progressing Goal: Progress toward achieving an optimal weight will improve Outcome:  Progressing   Problem: Skin Integrity: Goal: Risk for impaired skin integrity will decrease Outcome: Progressing   Problem: Tissue Perfusion: Goal: Adequacy of tissue perfusion will improve Outcome: Progressing

## 2024-01-30 NOTE — Assessment & Plan Note (Signed)
 Determined by Dietician - Ensure supplementation BID

## 2024-01-30 NOTE — Progress Notes (Signed)
 Pt incontinent,

## 2024-02-03 ENCOUNTER — Emergency Department (HOSPITAL_COMMUNITY): Payer: Self-pay

## 2024-02-03 ENCOUNTER — Observation Stay (HOSPITAL_COMMUNITY)
Admission: EM | Admit: 2024-02-03 | Discharge: 2024-02-05 | Disposition: A | Discharge status: Home / Self Care | Payer: Self-pay | Attending: Family Medicine | Admitting: Family Medicine

## 2024-02-03 ENCOUNTER — Other Ambulatory Visit: Payer: Self-pay

## 2024-02-03 ENCOUNTER — Encounter (HOSPITAL_COMMUNITY): Payer: Self-pay

## 2024-02-03 DIAGNOSIS — D500 Iron deficiency anemia secondary to blood loss (chronic): Secondary | ICD-10-CM | POA: Insufficient documentation

## 2024-02-03 DIAGNOSIS — N3289 Other specified disorders of bladder: Secondary | ICD-10-CM

## 2024-02-03 DIAGNOSIS — E1122 Type 2 diabetes mellitus with diabetic chronic kidney disease: Secondary | ICD-10-CM | POA: Insufficient documentation

## 2024-02-03 DIAGNOSIS — N1330 Unspecified hydronephrosis: Secondary | ICD-10-CM | POA: Insufficient documentation

## 2024-02-03 DIAGNOSIS — A419 Sepsis, unspecified organism: Principal | ICD-10-CM | POA: Insufficient documentation

## 2024-02-03 DIAGNOSIS — N329 Bladder disorder, unspecified: Secondary | ICD-10-CM | POA: Insufficient documentation

## 2024-02-03 DIAGNOSIS — D631 Anemia in chronic kidney disease: Secondary | ICD-10-CM | POA: Insufficient documentation

## 2024-02-03 DIAGNOSIS — D649 Anemia, unspecified: Secondary | ICD-10-CM

## 2024-02-03 DIAGNOSIS — I2699 Other pulmonary embolism without acute cor pulmonale: Secondary | ICD-10-CM | POA: Insufficient documentation

## 2024-02-03 DIAGNOSIS — N189 Chronic kidney disease, unspecified: Secondary | ICD-10-CM | POA: Insufficient documentation

## 2024-02-03 LAB — CBC WITH DIFFERENTIAL/PLATELET
Abs Immature Granulocytes: 0.16 K/uL — ABNORMAL HIGH (ref 0.00–0.07)
Basophils Absolute: 0.1 K/uL (ref 0.0–0.1)
Basophils Relative: 0 %
Eosinophils Absolute: 0.3 K/uL (ref 0.0–0.5)
Eosinophils Relative: 1 %
HCT: 22 % — ABNORMAL LOW (ref 39.0–52.0)
Hemoglobin: 6.8 g/dL — CL (ref 13.0–17.0)
Immature Granulocytes: 1 %
Lymphocytes Relative: 13 %
Lymphs Abs: 2.7 K/uL (ref 0.7–4.0)
MCH: 27.1 pg (ref 26.0–34.0)
MCHC: 30.9 g/dL (ref 30.0–36.0)
MCV: 87.6 fL (ref 80.0–100.0)
Monocytes Absolute: 1.1 K/uL — ABNORMAL HIGH (ref 0.1–1.0)
Monocytes Relative: 5 %
Neutro Abs: 17 K/uL — ABNORMAL HIGH (ref 1.7–7.7)
Neutrophils Relative %: 80 %
Platelets: 365 K/uL (ref 150–400)
RBC: 2.51 MIL/uL — ABNORMAL LOW (ref 4.22–5.81)
RDW: 16.6 % — ABNORMAL HIGH (ref 11.5–15.5)
Smear Review: NORMAL
WBC: 21.3 K/uL — ABNORMAL HIGH (ref 4.0–10.5)
nRBC: 0 % (ref 0.0–0.2)

## 2024-02-03 LAB — COMPREHENSIVE METABOLIC PANEL WITH GFR
ALT: 57 U/L — ABNORMAL HIGH (ref 0–44)
AST: 41 U/L (ref 15–41)
Albumin: 1.7 g/dL — ABNORMAL LOW (ref 3.5–5.0)
Alkaline Phosphatase: 67 U/L (ref 38–126)
Anion gap: 10 (ref 5–15)
BUN: 46 mg/dL — ABNORMAL HIGH (ref 8–23)
CO2: 20 mmol/L — ABNORMAL LOW (ref 22–32)
Calcium: 8 mg/dL — ABNORMAL LOW (ref 8.9–10.3)
Chloride: 105 mmol/L (ref 98–111)
Creatinine, Ser: 1.52 mg/dL — ABNORMAL HIGH (ref 0.61–1.24)
GFR, Estimated: 43 mL/min — ABNORMAL LOW (ref >60)
Glucose, Bld: 98 mg/dL (ref 70–99)
Potassium: 4.7 mmol/L (ref 3.5–5.1)
Sodium: 135 mmol/L (ref 135–145)
Total Bilirubin: 0.3 mg/dL (ref 0.0–1.2)
Total Protein: 6.8 g/dL (ref 6.5–8.1)

## 2024-02-03 LAB — I-STAT VENOUS BLOOD GAS, ED
Acid-base deficit: 1 mmol/L (ref 0.0–2.0)
Bicarbonate: 23.7 mmol/L (ref 20.0–28.0)
Calcium, Ion: 1.17 mmol/L (ref 1.15–1.40)
HCT: 24 % — ABNORMAL LOW (ref 39.0–52.0)
Hemoglobin: 8.2 g/dL — ABNORMAL LOW (ref 13.0–17.0)
O2 Saturation: 76 %
Potassium: 5 mmol/L (ref 3.5–5.1)
Sodium: 138 mmol/L (ref 135–145)
TCO2: 25 mmol/L (ref 22–32)
pCO2, Ven: 36.6 mmHg — ABNORMAL LOW (ref 44–60)
pH, Ven: 7.42 (ref 7.25–7.43)
pO2, Ven: 40 mmHg (ref 32–45)

## 2024-02-03 LAB — MAGNESIUM: Magnesium: 1.9 mg/dL (ref 1.7–2.4)

## 2024-02-03 LAB — TSH: TSH: 2.162 u[IU]/mL (ref 0.350–4.500)

## 2024-02-03 LAB — AMMONIA: Ammonia: 15 umol/L (ref 9–35)

## 2024-02-03 LAB — PREPARE RBC (CROSSMATCH)

## 2024-02-03 LAB — I-STAT CG4 LACTIC ACID, ED: Lactic Acid, Venous: 1.2 mmol/L (ref 0.5–1.9)

## 2024-02-03 MED ORDER — IOHEXOL 350 MG/ML SOLN
75.0000 mL | Freq: Once | INTRAVENOUS | Status: AC | PRN
  Administered 2024-02-03: 75 mL via INTRAVENOUS

## 2024-02-03 MED ORDER — VANCOMYCIN HCL 1250 MG/250ML IV SOLN
1250.0000 mg | Freq: Once | INTRAVENOUS | Status: AC
  Administered 2024-02-03: 1250 mg via INTRAVENOUS
  Filled 2024-02-03: qty 250

## 2024-02-03 MED ORDER — ADULT MULTIVITAMIN W/MINERALS CH
1.0000 | ORAL_TABLET | Freq: Every day | ORAL | Status: DC
  Administered 2024-02-04 – 2024-02-05 (×2): 1 via ORAL
  Filled 2024-02-03 (×2): qty 1

## 2024-02-03 MED ORDER — PIPERACILLIN-TAZOBACTAM 3.375 G IVPB 30 MIN
3.3750 g | Freq: Once | INTRAVENOUS | Status: AC
  Administered 2024-02-03: 3.375 g via INTRAVENOUS
  Filled 2024-02-03: qty 50

## 2024-02-03 MED ORDER — ACETAMINOPHEN 325 MG PO TABS: 650.0000 mg | ORAL_TABLET | Freq: Four times a day (QID) | ORAL | Status: DC | PRN

## 2024-02-03 MED ORDER — LACTATED RINGERS IV BOLUS
1000.0000 mL | Freq: Once | INTRAVENOUS | Status: AC
Start: 2024-02-03 — End: 2024-02-03
  Administered 2024-02-03: 1000 mL via INTRAVENOUS

## 2024-02-03 MED ORDER — ACETAMINOPHEN 650 MG RE SUPP: 650.0000 mg | Freq: Four times a day (QID) | RECTAL | Status: DC | PRN

## 2024-02-03 MED ORDER — SODIUM CHLORIDE 0.9% IV SOLUTION: Freq: Once | INTRAVENOUS | Status: AC

## 2024-02-03 MED ORDER — POLYETHYLENE GLYCOL 3350 17 G PO PACK: 17.0000 g | PACK | Freq: Every day | ORAL | Status: DC | PRN

## 2024-02-03 NOTE — ED Notes (Signed)
 Critical lab result hemoglobin 6.8 notified from lab. MD made aware

## 2024-02-03 NOTE — ED Notes (Signed)
PT BACK FROM XRAY

## 2024-02-03 NOTE — H&P (Incomplete)
 Hospital Admission History and Physical Service Pager: [phone]  Patient name: Jeff Parks Medical record number: [id number] Date of Birth: 05/30/1929 Age: 89 y.o. Gender: male  Primary Care Provider: None Consultants: None Code Status: Full code which was confirmed with family if patient unable to confirm, wife at bedside confirmed this was his wish   Preferred Emergency Contact:  Contact Information     Name Relation Home Work Mobile   Ross Spouse [phone]  [phone]   Gladis Norris Daughter   [phone]      Other Contacts   None on File      Chief Complaint: AMS  Differential and Medical Decision Making:  Jeff Parks is a 89 y.o. male presenting with hypotension, anemia and fever meeting sepsis criteria.    Source of sepsis include:  HCAP with recent hospital admission  Aspiration pneumonia with waxing and waning mental status and continued PO intake  UTI, less likely with normal urine culture on 9/27  New acute PE found in addition to nonspecific lymphadenopathy in the retroperitoneal space on CT.  With new blood clot, reported melena, and lymphadenopathy, suspect underlying GI malignancy causing hypercoagulable state increasing risk for PE. Discussed the risks and benefits of treating a PE with his anemic state and melena. Wife elected to hold anticoagulation due to anemia and his stable respiratory status.   Long GOC discussion with wife. Patient able to understand conversation but did not appear to have capacity to make medical decisions. Wife's goals are to get him home and to make him comfortable. We will plan to treat the treatable this admission. Will consult palliative to hold family meeting to facilitate long term GOC. With his disease burden and age, I suspect he has weeks to months left to live. Wife would like him to remain full code per patient's wishes.  Assessment & Plan Sepsis (HCC) Most likely pulmonary source with CT findings.  Clinically appears stable.  - Admit to FMTS, attending Dr. Rosalynn  - Progressive, Vital signs per floor - Fluids: S/p 1 L bolus  - Antibiotics: continue Zosyn and Vanc (10/6- ) - Fall and Delirium precautions Anemia due to chronic blood loss Suspect anemia is 2/2 to chronic blood loss with reported history of melena. Kidney function normal. GI evaluated at last admission, patient is not a candidate for advanced diagnostic procedures. Family agreeable to treat with transfusions.  - s/p 1 unit RBC 10/6 - Post-transfusion CBC pending  - Transfuse <8 with new PE  Pulmonary embolism (HCC) Stable respiratory status. Suspect provoked by underlying malignancy. Not treating due to patient being high risk for bleeding.  - cardiac monitoring with pulse ox  - monitor clinically    Bladder scan  Straight cath pending  Palliative care consult, hospice  FEN/GI: N.p.o. VTE Prophylaxis: SCDs, high risk for bleeding   Disposition: Progressive  History of Present Illness:  Jeff Parks is a 89 y.o. male presenting with hypotension, low outpatient hemoglobin.   Wife reports melanotic bowel movements for the past few weeks.   In the ED, presented from SNF with hypotension and AMS with low hemoglobin.  Found to be anemic to 6.8.  CT head without contrast negative for acute abnormality.  EKG similar to prior.  He received 1 L bolus along with Zosyn and vancomycin for coverage of chest x-ray consolidation and meeting sepsis criteria with fever to 100.7.  He underwent a CT angio abdomen pelvis which showed lower lobe PE on the left with inflamed lymph nodes  possibly secondary to malignancy.  1 unit RBCs was ordered.  Lactic acid normal.  FMTS called for admission.   - Looked good earlier, has been doing well  Review Of Systems: Per HPI with the following additions: As above  Pertinent Past Medical History: Hyperthyroidism Hx of bladder outlet obstruction Anemia Hx of hemodialysis (appears to be due to  transient need due to renal failure from hydronephrosis) T2DM (self reported) CKD (self reported, likely 2/2 persistent obstruction) Remainder reviewed in history tab.   Pertinent Past Surgical History: Transurethral resection of prostate Remainder reviewed in history tab.   Pertinent Social History: Tobacco use: Yes*** Alcohol use: None Other Substance use: None Lives with wife  Pertinent Family History: Mother: Diabetes Father: Anemia  Important Outpatient Medications: Acetaminophen  Feeding supplement Ferrous sulfate  325 mg daily Folic acid  1 mg daily Medihoney Multivitamin  Objective: BP 112/61 (BP Location: Right Arm)   Pulse 79   Temp 98.5 F (36.9 C) (Oral)   Resp 16   SpO2 100%  Exam: General: Chronically ill-appearing, no acute distress Cardio: RRR, no murmur on exam Pulm:***, No increased work of breathing Abdomen: Soft, bowel sounds present, nontender Extremity: No peripheral edema   Labs:  CBC BMET  Recent Labs  Lab 02/03/24 2035  WBC 21.3*  HGB 6.8*  HCT 22.0*  PLT 365   Recent Labs  Lab 02/03/24 1921 02/03/24 1929  NA 135 138  K 4.7 5.0  CL 105  --   CO2 20*  --   BUN 46*  --   CREATININE 1.52*  --   GLUCOSE 98  --   CALCIUM  8.0*  --     Pertinent additional labs VBG pH 7.4, pCO2 36.6.   EKG: Normal sinus rhythm, QTc 395, mild elevation in ST segment with PAC   Imaging Studies Performed:  CT Angio Abdomen Pelvis:  Admit examination for PE on the right lung base Bilateral pleural effusions with basilar atelectasis greater on the right Pelvic, retroperitoneal, and retrocrural lymphadenopathy nonspecific but possibly metastatic Bilateral hydronephrosis and hydroureter similar to prior study Small amount of free fluid in the pelvis No focal site of active gastrointestinal bleeding  CT head without contrast Chronic white matter ischemic changes No acute abnormality  CXR: Developing of right lower to mid lung zone consolidation  as well as trace right pleural effusion.  Follow-up PA and lateral chest x-ray recommended in 3 to 4 weeks following therapy to ensure resolution  Cleotilde Perkins, DO 02/03/2024, 10:44 PM PGY-3, [facility] Family Medicine  FPTS Intern pager: [phone], text pages welcome Secure chat group [facility] Teaching Service

## 2024-02-03 NOTE — ED Provider Notes (Signed)
 [place] EMERGENCY DEPARTMENT AT [facility] Provider Note   CSN: [id number] Arrival date & time: 02/03/24  8164     Patient presents with: Altered Mental Status   Jeff Parks is a 89 y.o. male.   HPI Patient is a 89 year old male with PMH of hypothyroidism, history of bladder outlet obstruction, T2DM, and recent hospitalization 09/25-10/2 for symptomatic anemia presenting to the ED via EMS for reported low hemoglobin on outpatient testing as well as soft BPs with with SBP approximately 90 in the prehospital setting without intervention.  Per EMS, patient is reportedly A and O x 4 at baseline.  Upon arrival, patient in no acute distress, denying any acute somatic complaints at this time including headache, vision changes, chest pain, shortness of breath, fevers, chills, nausea, vomiting, abdominal pain, changes in urination, and changes in bowel movements    Patient's wife, Delayne, contacted for collateral, reports that patient has had melanotic/dark black bowel movements over the last several weeks.  Also of note, patient's wife reports that patient is generally oriented to person and place at baseline, but generally not time or situation.  Prior to Admission medications   Medication Sig Start Date End Date Taking? Authorizing Provider  acetaminophen  (TYLENOL ) 500 MG tablet Take 500-1,000 mg by mouth every 6 (six) hours as needed for moderate pain (pain score 4-6).    [provider]  feeding supplement (ENSURE PLUS HIGH PROTEIN) LIQD Take 237 mLs by mouth 2 (two) times daily between meals. 01/29/24   Cleotilde Lukes, DO  ferrous sulfate  325 (65 FE) MG tablet Take 1 tablet (325 mg total) by mouth daily with breakfast. 01/29/24   Cleotilde Lukes, DO  folic acid  (FOLVITE ) 1 MG tablet Take 1 tablet (1 mg total) by mouth daily. 01/29/24   Cleotilde Lukes, DO  leptospermum manuka honey (MEDIHONEY) PSTE paste Apply 1 Application topically daily. 01/29/24   Cleotilde Lukes, DO   Multiple Vitamin (MULTIVITAMIN WITH MINERALS) TABS tablet Take 1 tablet by mouth daily. 01/29/24   Cleotilde Lukes, DO    Allergies: Patient has no known allergies.    Review of Systems  Updated Vital Signs BP 112/61 (BP Location: Right Arm)   Pulse 79   Temp 98.5 F (36.9 C) (Oral)   Resp 16   SpO2 100%   Physical Exam Constitutional:      Appearance: He is underweight.  HENT:     Head: Atraumatic.     Mouth/Throat:     Mouth: Mucous membranes are moist.     Pharynx: Oropharynx is clear.  Eyes:     Extraocular Movements: Extraocular movements intact.     Pupils: Pupils are equal, round, and reactive to light.  Cardiovascular:     Rate and Rhythm: Normal rate and regular rhythm.     Pulses: Normal pulses.  Pulmonary:     Effort: Pulmonary effort is normal.     Breath sounds: Normal breath sounds.  Abdominal:     General: Abdomen is flat.     Palpations: Abdomen is soft.  Musculoskeletal:        General: Normal range of motion.     Cervical back: Normal range of motion.  Skin:    General: Skin is warm and dry.     Capillary Refill: Capillary refill takes 2 to 3 seconds.  Neurological:     General: No focal deficit present.     Mental Status: He is alert.     Comments: Oriented to person, but not place,  time, or situation  Psychiatric:        Behavior: Behavior is cooperative.     (all labs ordered are listed, but only abnormal results are displayed) Labs Reviewed  COMPREHENSIVE METABOLIC PANEL WITH GFR - Abnormal; Notable for the following components:      Result Value   CO2 20 (*)    BUN 46 (*)    Creatinine, Ser 1.52 (*)    Calcium  8.0 (*)    Albumin 1.7 (*)    ALT 57 (*)    GFR, Estimated 43 (*)    All other components within normal limits  CBC WITH DIFFERENTIAL/PLATELET - Abnormal; Notable for the following components:   WBC 21.3 (*)    RBC 2.51 (*)    Hemoglobin 6.8 (*)    HCT 22.0 (*)    RDW 16.6 (*)    Neutro Abs 17.0 (*)    Monocytes  Absolute 1.1 (*)    Abs Immature Granulocytes 0.16 (*)    All other components within normal limits  I-STAT VENOUS BLOOD GAS, ED - Abnormal; Notable for the following components:   pCO2, Ven 36.6 (*)    HCT 24.0 (*)    Hemoglobin 8.2 (*)    All other components within normal limits  CULTURE, BLOOD (ROUTINE X 2)  CULTURE, BLOOD (ROUTINE X 2)  MAGNESIUM  AMMONIA  TSH  CBC WITH DIFFERENTIAL/PLATELET  URINALYSIS, ROUTINE W REFLEX MICROSCOPIC  I-STAT CG4 LACTIC ACID, ED  TYPE AND SCREEN  PREPARE RBC (CROSSMATCH)    EKG: None  Radiology: CT Angio Abd/Pel W and/or Wo Contrast Result Date: 02/03/2024 CLINICAL DATA:  Acute anemia. Gastrointestinal bleeding. Altered mental status. EXAM: CTA ABDOMEN AND PELVIS WITHOUT AND WITH CONTRAST TECHNIQUE: Multidetector CT imaging of the abdomen and pelvis was performed using the standard protocol during bolus administration of intravenous contrast. Multiplanar reconstructed images and MIPs were obtained and reviewed to evaluate the vascular anatomy. RADIATION DOSE REDUCTION: This exam was performed according to the departmental dose-optimization program which includes automated exposure control, adjustment of the mA and/or kV according to patient size and/or use of iterative reconstruction technique. CONTRAST:  75mL OMNIPAQUE  IOHEXOL  350 MG/ML SOLN COMPARISON:  01/23/2024 FINDINGS: VASCULAR Aorta: Moderate aortic calcifications. No aneurysm. No dissection or significant stenosis. Celiac: Patent without evidence of aneurysm, dissection, vasculitis or significant stenosis. SMA: Patent without evidence of aneurysm, dissection, vasculitis or significant stenosis. Renals: Both renal arteries are patent without evidence of aneurysm, dissection, vasculitis, fibromuscular dysplasia or significant stenosis. IMA: Patent without evidence of aneurysm, dissection, vasculitis or significant stenosis. Inflow: Patent without evidence of aneurysm, dissection, vasculitis or  significant stenosis. Proximal Outflow: Bilateral common femoral and visualized portions of the superficial and profunda femoral arteries are patent without evidence of aneurysm, dissection, vasculitis or significant stenosis. Veins: No obvious venous abnormality within the limitations of this arterial phase study. Review of the MIP images confirms the above findings. NON-VASCULAR Lower chest: Filling defects demonstrated in the right lower lung pulmonary arteries consistent with acute pulmonary embolus. Small bilateral pleural effusions, greater on the right. Bilateral basilar atelectasis. Hepatobiliary: Multiple hepatic cysts, unchanged. No imaging follow-up is indicated. Cholelithiasis. No gallbladder wall thickening or edema. No bile duct dilatation. Pancreas: Unremarkable. No pancreatic ductal dilatation or surrounding inflammatory changes. Spleen: Normal in size without focal abnormality. Adrenals/Urinary Tract: No adrenal gland nodules. Bilateral renal cysts are unchanged since prior study. No imaging follow-up is indicated. Largest includes a right lower pole cyst measuring 9.7 cm diameter. There is bilateral hydronephrosis and  hydroureter to the level of the bladder. Diffuse bladder wall thickening with polypoid mass in the left bladder base measuring 2.9 cm diameter. Small amount of gas in the bladder could result from instrumentation or infection. Similar appearance to the bladder and kidneys compared with prior study. Stomach/Bowel: Stomach, small bowel, and colon are not abnormally distended. Stool scattered throughout the colon. No discrete wall thickening or inflammatory stranding. Appendix is normal. No intraluminal contrast extravasation or contrast pooling is seen. No focal site of active gastrointestinal hemorrhage is identified. Lymphatic: Lymphadenopathy in the retrocrural region, throughout the retroperitoneum, and in the pelvis bilaterally. Mild prominence of lymph nodes in the groin regions.  Largest retroperitoneal lymph nodes measure up to about 2 cm short axis dimension. Lymphadenopathy is similar to prior study, possibly metastatic. Reproductive: Prostate gland is poorly demonstrated, possibly surgically absent. Other: Small amount of free fluid in the pelvis is possibly ascites or reactive. No loculated collections. Abdominal wall musculature appears intact. Diffuse edema in the subcutaneous fatty tissues. Musculoskeletal: Degenerative changes in the spine and hips. No acute bony abnormalities. IMPRESSION: VASCULAR Diffuse aortic atherosclerosis. No evidence of occlusion, dissection, or critical stenosis of the major vessels in the abdomen or pelvis. NON-VASCULAR 1. Positive examination for pulmonary embolus in the right lung base. 2. Bilateral pleural effusions with basilar atelectasis, greater on the right. 3. Diffuse wall thickening of the bladder left posterior bladder base mass lesion, suggesting transitional cell neoplasm possibly with superimposed infection. Gas in the bladder may be due to infection or instrumentation. Similar appearance to prior study. 4. Pelvic, retroperitoneal, and retrocrural lymphadenopathy, nonspecific but possibly metastatic. 5. Cholelithiasis. 6. Bilateral hydronephrosis and hydroureter. Similar appearance to previous study. This is likely due to reflux or bladder outlet obstruction. 7. Small amount of free fluid in the pelvis is likely reactive or ascites. 8. No focal site of active gastrointestinal bleeding is identified. Critical Value/emergent results were called by telephone at the time of interpretation on 02/03/2024 at 10:32 pm to provider [facility] , who verbally acknowledged these results. Electronically Signed   By: Elsie Gravely M.D.   On: 02/03/2024 22:36   CT Head Wo Contrast Result Date: 02/03/2024 CLINICAL DATA:  Altered mental status EXAM: CT HEAD WITHOUT CONTRAST TECHNIQUE: Contiguous axial images were obtained from the base of the skull  through the vertex without intravenous contrast. RADIATION DOSE REDUCTION: This exam was performed according to the departmental dose-optimization program which includes automated exposure control, adjustment of the mA and/or kV according to patient size and/or use of iterative reconstruction technique. COMPARISON:  01/23/2024 FINDINGS: Brain: No evidence of acute infarction, hemorrhage, hydrocephalus, extra-axial collection or mass lesion/mass effect. Mild chronic white matter ischemic changes are noted. Vascular: No hyperdense vessel or unexpected calcification. Skull: Normal. Negative for fracture or focal lesion. Sinuses/Orbits: No acute finding. Other: None. IMPRESSION: Chronic white matter ischemic changes.  No acute abnormality noted. Electronically Signed   By: Oneil Devonshire M.D.   On: 02/03/2024 22:20   DG Chest 2 View Result Date: 02/03/2024 CLINICAL DATA:  hypotension EXAM: CHEST - 2 VIEW COMPARISON:  Chest x-ray 01/23/2024 FINDINGS: The heart and mediastinal contours are unchanged. Atherosclerotic plaque. Developing of right lower to mid lung zone consolidation and nodular-like density. No pulmonary edema. Interval development of trace right pleural effusion pleural effusion. No pneumothorax. No acute osseous abnormality. IMPRESSION: 1. Developing of right lower to mid lung zone consolidation as well as trace right pleural effusion. Followup PA and lateral chest X-ray is recommended in 3-4  weeks following therapy to ensure resolution. 2.  Aortic Atherosclerosis (ICD10-I70.0). Electronically Signed   By: Morgane  Naveau M.D.   On: 02/03/2024 19:51     Ultrasound ED Echo  Date/Time: 02/03/2024 11:00 PM  Performed by: Dorcus Fallow, MD Authorized by: Tonia Chew, MD   Procedure details:    Indications comment:  Known PE   Views: subxiphoid, parasternal long axis view, parasternal short axis view and apical 4 chamber view     Images: archived     Limitations:  Increased thoracic air and  body habitus Findings:    Pericardium: no pericardial effusion     LV Function: normal (>50% EF)     RV Diameter: normal     Other signs of RV strain comment:  No overt signs of RV strain Impression:    Impression: normal      Medications Ordered in the ED  lactated ringers  bolus 1,000 mL (0 mLs Intravenous Stopped 02/03/24 2024)  piperacillin-tazobactam (ZOSYN) IVPB 3.375 g (0 g Intravenous Stopped 02/03/24 2018)  vancomycin (VANCOREADY) IVPB 1250 mg/250 mL (0 mg Intravenous Stopped 02/03/24 2154)  0.9 %  sodium chloride  infusion (Manually program via Guardrails IV Fluids) ( Intravenous New Bag/Given 02/03/24 2217)  iohexol  (OMNIPAQUE ) 350 MG/ML injection 75 mL (75 mLs Intravenous Contrast Given 02/03/24 2216)    Clinical Course as of 02/03/24 2305  Mon Feb 03, 2024  1931 Hemoglobin 8.2 on VBG [WB]  2000 CXR indicative of right lower lobe consolidation concerning for newly acquired pneumonia [WB]  2234 Pulm embolus in right lung base.  [WB]    Clinical Course User Index [WB] Dorcus Fallow, MD                                 Medical Decision Making Amount and/or Complexity of Data Reviewed Labs: ordered. Radiology: ordered.  Risk Prescription drug management.  89 year old male with recent hospitalization 09/25-10/02 to [facility] service in the setting of symptomatic anemia, requiring 2 units PRBCs during hospital course and notably on IV ceftriaxone  for UTI, presenting to the ED with soft BPs and reported worsening mental status with outpatient hemoglobin low per EMS.  No prehospital interventions.  Upon arrival, patient with borderline soft BP 96/46 heart rate 94, with elevated shock index.  Febrile T38.2C.  No tachypnea.  Saturating 99% RA.  GCS 14, with patient disoriented to time, place, and situation.  Nontoxic-appearing in no acute distress.  Prompt type and screen ordered.  Blood cultures collected with concern for sepsis/bacteremia.  Rectal examination performed with RN chaperone  present -negative for evidence of gross blood.  No obvious lesions or masses.  Abdominal examination benign, soft, non-tender.  Patient's wife, Delayne contacted for collateral, as above.  Upon reassessment, patient resting comfortably with wife at bedside.  Patient given 500 cc LR bolus in the setting of soft BPs with appropriate response.  In the setting of fever and hypotension, with IV ceftriaxone  during recent hospitalization, patient initiated on broad-spectrum IV vancomycin and Zosyn.  CXR resulted with right lower lobe consolidation concerning for early developing pneumonia.  CBC significant for neutrophil predominant leukocytosis, WBC 21.3.  Hemoglobin 6.8 -infusion ordered for 1 unit PRBCs in place. Lactate notably 1.2.  CTA abdomen pelvis without evidence of active extravasation/gastrointestinal hemorrhage, however pulmonary embolus appreciated in the right lung base per radiology.  Diffuse wall thickening of the bladder left posterior bladder with bladder base mass lesion noted, unchanged from prior.  Bedside US  without evidence of overt RV strain, EKG without signs of acute core pulmonale, patient denying chest pain/SOB, tolerating RA without dyspnea,  and hemodynamics responsive to fluid resuscitation, low suspicion for significant hemodynamic dysfunction secondary to PE at this time.    Discussed with FM service, patient to be admitted for ongoing medical management.   Final diagnoses:  Sepsis without acute organ dysfunction, due to unspecified organism (HCC)  Anemia, unspecified type  Acute pulmonary embolism without acute cor pulmonale, unspecified pulmonary embolism type [facility])    ED Discharge Orders     None          Dorcus Fallow, MD 02/03/24 7694    Tonia Chew, MD 02/10/24 1357

## 2024-02-03 NOTE — ED Triage Notes (Signed)
 Pt BIBA from [facility] SNF for hypotension, AMS and low hemoglobin. Pt denies complaints at this time, oriented to self only.

## 2024-02-03 NOTE — Progress Notes (Signed)
 ED Pharmacy Antibiotic Sign Off An antibiotic consult was received from an ED provider for vancomycin per pharmacy dosing for sepsis. A chart review was completed to assess appropriateness.   The following one time order(s) were placed:  Vancomycin 1250mg  IV x 1  Further antibiotic and/or antibiotic pharmacy consults should be ordered by the admitting provider if indicated.   Thank you for allowing pharmacy to be a part of this patient's care.   Dorn Poot, [facility]  Clinical Pharmacist 02/03/24 7:18 PM

## 2024-02-03 NOTE — ED Notes (Signed)
 Called blood bank to verify ok to start blood since p/u time was 2138. Blood bank states ok to initiate as long as infusion is complete W/I 4 hours.

## 2024-02-03 NOTE — H&P (Incomplete)
 Hospital Admission History and Physical Service Pager: [phone]  Patient name: Rushil Kimbrell Medical record number: [id number] Date of Birth: 02-Jun-1929 Age: 89 y.o. Gender: male  Primary Care Provider: None Consultants: None Code Status: Full code which was confirmed with family if patient unable to confirm, wife at bedside confirmed this was his wish   Preferred Emergency Contact:  Contact Information     Name Relation Home Work Mobile   [place] Spouse [phone]  [phone]   Gladis Norris Daughter   [phone]      Other Contacts   None on File      Chief Complaint: AMS  Differential and Medical Decision Making:  Mozes Sagar is a 89 y.o. male presenting with hypotension, anemia and fever meeting sepsis criteria.    Source of sepsis include:  HCAP with recent hospital admission  Aspiration pneumonia with waxing and waning mental status and continued PO intake  UTI, less likely with normal urine culture on 9/27  New acute PE found in addition to nonspecific lymphadenopathy in the retroperitoneal space on CT.  With new blood clot, reported melena, and lymphadenopathy, suspect underlying GI malignancy causing hypercoagulable state increasing risk for PE. Other source of malignancy is 2.1 x 2.2 x 4.8 cm lobulated mural mass involving the left posterolateral base of the bladder, suspicious for primary urothelial malignancy. Discussed the risks and benefits of treating a PE with his anemic state and melena. Wife elected to hold anticoagulation due to anemia and his stable respiratory status.   Long GOC discussion with wife. Patient able to understand conversation but did not appear to have capacity to make medical decisions. Wife's goals are to get him home and to make him comfortable. We will plan to treat the treatable this admission. Will consult palliative to hold family meeting to facilitate long term GOC. With his disease burden and age, I suspect he has weeks to  months left to live. Wife would like him to remain full code per patient's wishes.  Assessment & Plan Sepsis (HCC) Most likely pulmonary source with CT findings. Clinically appears stable.  - Admit to FMTS, attending Dr. Rosalynn  - Progressive, Vital signs per floor - Fluids: S/p 1 L bolus  - Antibiotics: continue Zosyn and Vanc (10/6- ) - Fall and Delirium precautions - Tylenol  PRN for fever  Anemia due to chronic blood loss Suspect anemia is 2/2 to chronic blood loss with reported history of melena and prior iron panel from last admission. Family agreeable to treat with transfusions and decline further diagnostic testing.   - s/p 1 unit RBC 10/6 - Post-transfusion CBC pending  - Transfuse <8 with new PE  Pulmonary embolism (HCC) Stable respiratory status. Suspect provoked by underlying malignancy. Not treating due to patient being high risk for bleeding.  - cardiac monitoring with pulse ox  - monitor clinically  Hydronephrosis, bilateral Present from prior admission. Suspect outlet obstruction.  - Bladder scans q8h, if <300 x3 can discontinue  - consider Flomax, may not be tolerated with patient's soft BPs  FEN/GI: N.p.o. VTE Prophylaxis: SCDs, high risk for bleeding   Disposition: Progressive  History of Present Illness:  Rolland Steinert is a 89 y.o. male presenting with hypotension, low outpatient hemoglobin.   Wife reports melanotic bowel movements for the past few weeks. SNF checking Hgb and found it to be in the 6s per EMS. Wife reports he was doing well at rehab and is asymptomatic.   In the ED, found to be anemic to  6.8.  CT head without contrast negative for acute abnormality.  EKG similar to prior.  He received 1 L bolus along with Zosyn and vancomycin for coverage of chest x-ray consolidation and meeting sepsis criteria with fever to 100.7.  He underwent a CT angio abdomen pelvis which showed lower lobe PE on the left with inflamed lymph nodes possibly secondary to malignancy.   1 unit RBCs was ordered.  Lactic acid normal.  FMTS called for admission.  Review Of Systems: Per HPI with the following additions: As above  Pertinent Past Medical History: Hyperthyroidism Hx of bladder outlet obstruction Anemia Hx of hemodialysis (appears to be due to transient need due to renal failure from hydronephrosis) T2DM (self reported) CKD (self reported, likely 2/2 persistent obstruction) Remainder reviewed in history tab.   Pertinent Past Surgical History: Transurethral resection of prostate Remainder reviewed in history tab.   Pertinent Social History: Tobacco use: Yes Alcohol use: None Other Substance use: None Lives with wife  Pertinent Family History: Mother: Diabetes Father: Anemia  Important Outpatient Medications: Acetaminophen  Feeding supplement Ferrous sulfate  325 mg daily Folic acid  1 mg daily Medihoney Multivitamin  Objective: BP 112/61 (BP Location: Right Arm)   Pulse 79   Temp 98.5 F (36.9 C) (Oral)   Resp 16   SpO2 100%  Exam: General: Chronically ill-appearing, cachectic, no acute distress  Patient declined exam.   Labs:  CBC BMET  Recent Labs  Lab 02/03/24 2035  WBC 21.3*  HGB 6.8*  HCT 22.0*  PLT 365   Recent Labs  Lab 02/03/24 1921 02/03/24 1929  NA 135 138  K 4.7 5.0  CL 105  --   CO2 20*  --   BUN 46*  --   CREATININE 1.52*  --   GLUCOSE 98  --   CALCIUM  8.0*  --     Pertinent additional labs VBG pH 7.4, pCO2 36.6.   EKG: Normal sinus rhythm, QTc 395, mild elevation in ST segment with PAC   Imaging Studies Performed:  CT Angio Abdomen Pelvis:  Admit examination for PE on the right lung base Bilateral pleural effusions with basilar atelectasis greater on the right Pelvic, retroperitoneal, and retrocrural lymphadenopathy nonspecific but possibly metastatic Bilateral hydronephrosis and hydroureter similar to prior study Small amount of free fluid in the pelvis No focal site of active gastrointestinal  bleeding  CT head without contrast Chronic white matter ischemic changes No acute abnormality  CXR: Developing of right lower to mid lung zone consolidation as well as trace right pleural effusion.  Follow-up PA and lateral chest x-ray recommended in 3 to 4 weeks following therapy to ensure resolution  Cleotilde Perkins, DO 02/03/2024, 10:44 PM PGY-3, [facility]  FPTS Intern pager: [phone], text pages welcome Secure chat group [facility] Teaching Service

## 2024-02-03 NOTE — ED Notes (Signed)
 PT TO XRAY

## 2024-02-03 NOTE — ED Notes (Signed)
 RN to bedside for SPO2 monitor alarm. SPO2 monitor noted to be off pt, pt and spouse at bedside states admitting MD removed and said pt did not need SPO2 monitoring.

## 2024-02-04 ENCOUNTER — Other Ambulatory Visit: Payer: Self-pay

## 2024-02-04 DIAGNOSIS — N3289 Other specified disorders of bladder: Secondary | ICD-10-CM

## 2024-02-04 DIAGNOSIS — Z515 Encounter for palliative care: Secondary | ICD-10-CM

## 2024-02-04 DIAGNOSIS — I2699 Other pulmonary embolism without acute cor pulmonale: Secondary | ICD-10-CM

## 2024-02-04 DIAGNOSIS — N1330 Unspecified hydronephrosis: Secondary | ICD-10-CM

## 2024-02-04 DIAGNOSIS — D500 Iron deficiency anemia secondary to blood loss (chronic): Secondary | ICD-10-CM

## 2024-02-04 DIAGNOSIS — D649 Anemia, unspecified: Secondary | ICD-10-CM

## 2024-02-04 DIAGNOSIS — A419 Sepsis, unspecified organism: Principal | ICD-10-CM

## 2024-02-04 DIAGNOSIS — Z7189 Other specified counseling: Secondary | ICD-10-CM

## 2024-02-04 LAB — CBC
HCT: 24.2 % — ABNORMAL LOW (ref 39.0–52.0)
Hemoglobin: 7.5 g/dL — ABNORMAL LOW (ref 13.0–17.0)
MCH: 27.6 pg (ref 26.0–34.0)
MCHC: 31 g/dL (ref 30.0–36.0)
MCV: 89 fL (ref 80.0–100.0)
Platelets: 361 K/uL (ref 150–400)
RBC: 2.72 MIL/uL — ABNORMAL LOW (ref 4.22–5.81)
RDW: 16.1 % — ABNORMAL HIGH (ref 11.5–15.5)
WBC: 19.8 K/uL — ABNORMAL HIGH (ref 4.0–10.5)
nRBC: 0 % (ref 0.0–0.2)

## 2024-02-04 LAB — TYPE AND SCREEN
ABO/RH(D): A POS
Antibody Screen: NEGATIVE
Unit division: 0

## 2024-02-04 LAB — COMPREHENSIVE METABOLIC PANEL WITH GFR
ALT: 41 U/L (ref 0–44)
AST: 29 U/L (ref 15–41)
Albumin: 1.5 g/dL — ABNORMAL LOW (ref 3.5–5.0)
Alkaline Phosphatase: 66 U/L (ref 38–126)
Anion gap: 10 (ref 5–15)
BUN: 39 mg/dL — ABNORMAL HIGH (ref 8–23)
CO2: 21 mmol/L — ABNORMAL LOW (ref 22–32)
Calcium: 8 mg/dL — ABNORMAL LOW (ref 8.9–10.3)
Chloride: 106 mmol/L (ref 98–111)
Creatinine, Ser: 1.69 mg/dL — ABNORMAL HIGH (ref 0.61–1.24)
GFR, Estimated: 38 mL/min — ABNORMAL LOW (ref >60)
Glucose, Bld: 111 mg/dL — ABNORMAL HIGH (ref 70–99)
Potassium: 4.4 mmol/L (ref 3.5–5.1)
Sodium: 137 mmol/L (ref 135–145)
Total Bilirubin: 0.4 mg/dL (ref 0.0–1.2)
Total Protein: 6.3 g/dL — ABNORMAL LOW (ref 6.5–8.1)

## 2024-02-04 LAB — HEMOGLOBIN AND HEMATOCRIT, BLOOD
HCT: 25.1 % — ABNORMAL LOW (ref 39.0–52.0)
Hemoglobin: 7.9 g/dL — ABNORMAL LOW (ref 13.0–17.0)

## 2024-02-04 LAB — BPAM RBC
Blood Product Expiration Date: 202511022359
ISSUE DATE / TIME: 202510062138
Unit Type and Rh: 6200

## 2024-02-04 MED ORDER — VANCOMYCIN HCL 750 MG/150ML IV SOLN: 750.0000 mg | INTRAVENOUS | Status: DC

## 2024-02-04 MED ORDER — PIPERACILLIN-TAZOBACTAM 3.375 G IVPB
3.3750 g | Freq: Three times a day (TID) | INTRAVENOUS | Status: DC
  Administered 2024-02-04: 3.375 g via INTRAVENOUS
  Filled 2024-02-04: qty 50

## 2024-02-04 MED ORDER — PIPERACILLIN-TAZOBACTAM 3.375 G IVPB 30 MIN: 3.3750 g | Freq: Three times a day (TID) | INTRAVENOUS | Status: DC

## 2024-02-04 NOTE — Assessment & Plan Note (Addendum)
 Nonfocal lung exam and VSS. Significant leukocytosis. CT chest with pleural effusions but no clear evidence of consolidations   - Will stop antibiotics at this point -Continue to monitor vitals -AM CBC

## 2024-02-04 NOTE — ED Notes (Signed)
 Unable to draw off pivs, Phlebotomy asked to stick.

## 2024-02-04 NOTE — Care Management CC44 (Signed)
 Condition Code 44 Documentation Completed  Patient Details  Name: Jeff Parks MRN: [id number] Date of Birth: 1929/09/05   Condition Code 44 given:  Yes Patient signature on Condition Code 44 notice:  Yes Documentation of 2 MD's agreement:  Yes Code 44 added to claim:  Yes    Nena LITTIE Coffee, RN 02/04/2024, 4:30 PM

## 2024-02-04 NOTE — Progress Notes (Signed)
     Daily Progress Note Intern Pager: [phone]  Patient name: Jeff Parks Medical record number: [id number] Date of birth: [birth year] Age: 89 y.o. Gender: male  Primary Care Provider: Patient, No Pcp Per Consultants: none Code Status: full  Pt Overview and Major Events to Date:  10/06 - admitted  Assessment and Plan:  This is a 89 yo male with PMH of renal failure and BPH presenting with hypotension, anemia and fever initially meeting septic criteria that is now resolved.  CT without evidence of pulmonary embolism; however, he does have persistent anemia and hematuria, with CT findings concerning for renal mass and associated lymphadenopathy.   Assessment & Plan Sepsis (HCC) (Resolved: 02/04/2024) Nonfocal lung exam and VSS. Significant leukocytosis. CT chest with pleural effusions but no clear evidence of consolidations   - Will stop antibiotics at this point -Continue to monitor vitals -AM CBC Anemia due to chronic blood loss Hematuria may be contributing to anemia.  FOBT collected last admission was negative though wife does report he has dark stools, in the setting of iron supplementation. Urinalysis still needs to be collected -P.m. H&H -AM CBC --Palliative to see today Pulmonary embolism (HCC) Respiratory status continues to be stable.  Rediscussed with wife and patient at bedside that we will hold off treatment at this time given his anemia.  Reengaged patient and wife and goals of care discussion today.  Wife believes his wish is to remain full code.  Patient has difficulty following conversation, so I defer to his wife as his primary decision-maker.  She ultimately would like to take him home and make him comfortable, but reiterates that his CODE STATUS should remain full. - Monitor respiratory status -P.m. H&H -Palliative to see today Hydronephrosis, bilateral -Will reassess need for bladder scans throughout the day, last was not concerning for retention  FEN/GI:  regular PPx: none Dispo:pending palliative discussions   Subjective:  No shortness of breath, chest pain, heart racing, lightheadedness.  No other concerns  Objective: Temp:  [97.9 F (36.6 C)-100.7 F (38.2 C)] 98.2 F (36.8 C) (10/07 1112) Pulse Rate:  [66-94] 69 (10/07 1100) Resp:  [14-21] 17 (10/07 1100) BP: (90-112)/(46-65) 93/55 (10/07 1100) SpO2:  [97 %-100 %] 100 % (10/07 1100) Physical Exam: General: Thin man lying comfortably in bed Cardiovascular: Regular rate and rhythm no murmurs, 2+ radial pulse, 2+ DP pulse, cap refill less than 2 seconds Respiratory: Faint breath sounds throughout all lung fields, but no focal findings, clear to auscultation bilaterally, no increased work of breathing Abdomen: Soft, nontender, nondistended  Laboratory: Most recent CBC Lab Results  Component Value Date   WBC 19.8 (H) 02/04/2024   HGB 7.5 (L) 02/04/2024   HCT 24.2 (L) 02/04/2024   MCV 89.0 02/04/2024   PLT 361 02/04/2024   Most recent BMP    Latest Ref Rng & Units 02/03/2024    7:29 PM  BMP  Sodium 135 - 145 mmol/L 138   Potassium 3.5 - 5.1 mmol/L 5.0     Imaging/Diagnostic Tests: None  Alena Morrison, Elio, MD 02/04/2024, 12:32 PM  PGY-1, [facility] Family Medicine FPTS Intern pager: [phone], text pages welcome Secure chat group [facility] Teaching Service

## 2024-02-04 NOTE — Assessment & Plan Note (Signed)
 Stable respiratory status. Suspect provoked by underlying malignancy. Not treating due to patient being high risk for bleeding.  - cardiac monitoring with pulse ox  - monitor clinically

## 2024-02-04 NOTE — Assessment & Plan Note (Signed)
 Suspect anemia is 2/2 to chronic blood loss with reported history of melena and prior iron panel from last admission. Family agreeable to treat with transfusions and decline further diagnostic testing.   - s/p 1 unit RBC 10/6 - Post-transfusion CBC pending  - Transfuse <8 with new PE

## 2024-02-04 NOTE — Progress Notes (Signed)
 [facility] Liaison Note  Received a referral for hospice services at home after discharge from [facility]. Spoke with Delayne, spouse and family to initiate education related to hospice philosophy, services and team approach to care. Family verbalized understanding of information given.  Per discussion, the plan is for discharge once DME is delivered (Wednesday 10.8)  Walid Haig, [phone] is the family contact.  Secondary contacts are Jamee Lunger (daughter) [phone] and Kaydin Karbowski (son) [phone].  DME requested is hospital bed and over the bed table.    Patient is a full code. Please provide prescriptions at discharge as needed to ensure ongoing symptom management.  [facility] information and contact numbers given to [place].  Above information shared with Transitions of Care Manager.  Please call with any questions or concerns.  Thank you for the opportunity to participate in this patient's care.  Inocente Jacobs, BSN, RN [employer] [phone]

## 2024-02-04 NOTE — Assessment & Plan Note (Signed)
 Most likely pulmonary source with CT findings. Clinically appears stable.  - Admit to FMTS, attending Dr. Rosalynn  - Progressive, Vital signs per floor - Fluids: S/p 1 L bolus  - Antibiotics: continue Zosyn and Vanc (10/6- ) - Fall and Delirium precautions - Tylenol  PRN for fever

## 2024-02-04 NOTE — ED Notes (Signed)
 Pt refused for his RN to take his VS. States he wanted to be left alone and his cousin is coming to pick him up. Pt reoriented and informed that pt is in the hospital and will be staying the night

## 2024-02-04 NOTE — ED Notes (Signed)
 Provider verified no need for second lactic acid at this time

## 2024-02-04 NOTE — Assessment & Plan Note (Addendum)
-  Will reassess need for bladder scans throughout the day, last was not concerning for retention

## 2024-02-04 NOTE — Care Management Obs Status (Signed)
 MEDICARE OBSERVATION STATUS NOTIFICATION   Patient Details  Name: Kaiser Belluomini MRN: [id number] Date of Birth: October 04, 1929   Medicare Observation Status Notification Given:  Yes    Nena LITTIE Coffee, RN 02/04/2024, 4:30 PM

## 2024-02-04 NOTE — Assessment & Plan Note (Signed)
 Present from prior admission. Suspect outlet obstruction.  - Bladder scans q8h, if <300 x3 can discontinue  - consider Flomax, may not be tolerated with patient's soft BPs

## 2024-02-04 NOTE — Assessment & Plan Note (Addendum)
 Hematuria may be contributing to anemia.  FOBT collected last admission was negative though wife does report he has dark stools, in the setting of iron supplementation. Urinalysis still needs to be collected -P.m. H&H -AM CBC --Palliative to see today

## 2024-02-04 NOTE — TOC Initial Note (Signed)
 Transition of Care [facility]) - Initial/Assessment Note    Patient Details  Name: Jeff Parks MRN: [id number] Date of Birth: 04-01-1930  Transition of [facility]) CM/SW Contact:    Nena LITTIE Coffee, RN Phone Number: 02/04/2024, 6:36 PM  Clinical Narrative:                 Pt from [facility] presented to the ED c/AMS, low Hgb. Family has opted to take pt home c/[facility] home hospice services rather than return to SNF. [facility] met c/pt, wife, daughter and son at the bedside this afternoon and discussed home hospice, ongoing care and DME. Admission orders are in place, pt currently in ED awaiting bed placement. Son is at the bedside.   Expected Discharge Plan: Home w Hospice Care Barriers to Discharge: Continued Medical Work up, Equipment Delay   Patient Goals and CMS Choice Patient states their goals for this hospitalization and ongoing recovery are:: Return home          Expected Discharge Plan and Services In-house Referral: Clinical Social Work Discharge Planning Services: CM Consult Post Acute Care Choice: Hospice Living arrangements for the past 2 months: Single Family Home, Skilled Nursing Facility                           HH Arranged: RN HH Agency: Other - See comment [facility] home hospice) Date HH Agency Contacted: 02/04/24   Representative spoke with at [facility] Agency: Inocente  Prior Living Arrangements/Services Living arrangements for the past 2 months: Single Family Home, Skilled Nursing Facility Lives with:: Spouse Patient language and need for interpreter reviewed:: Yes Do you feel safe going back to the place where you live?: Yes      Need for Family Participation in Patient Care: Yes (Comment) Care giver support system in place?: Yes (comment)   Criminal Activity/Legal Involvement Pertinent to Current Situation/Hospitalization: No - Comment as needed  Activities of Daily Living   ADL Screening (condition at time of  admission) Independently performs ADLs?: Yes (appropriate for developmental age) Is the patient deaf or have difficulty hearing?: No Does the patient have difficulty seeing, even when wearing glasses/contacts?: No Does the patient have difficulty concentrating, remembering, or making decisions?: No  Permission Sought/Granted Permission sought to share information with : Case Manager, Magazine features editor, Family Supports Permission granted to share information with : Yes, Verbal Permission Granted              Emotional Assessment Appearance:: Appears younger than stated age Attitude/Demeanor/Rapport: Engaged Affect (typically observed): Appropriate Orientation: : Oriented to Self, Oriented to Place, Oriented to Situation Alcohol / Substance Use: Not Applicable Psych Involvement: No (comment)  Admission diagnosis:  Sepsis (HCC) [A41.9] Patient Active Problem List   Diagnosis Date Noted   Hydronephrosis, bilateral 02/04/2024   Bladder mass 02/04/2024   Sepsis (HCC) 02/04/2024   Sepsis without acute organ dysfunction (HCC) 02/03/2024   Pulmonary embolism (HCC) 02/03/2024   Anemia due to chronic blood loss 02/03/2024   Gross hematuria 01/29/2024   Pressure injury of skin 01/24/2024   Protein-calorie malnutrition, severe 01/24/2024   Chronic health problem 01/23/2024   Physical deconditioning 01/23/2024   Sacral wound, initial encounter 01/23/2024   BPH (benign prostatic hyperplasia) 01/23/2024   Benign prostatic hyperplasia with urinary obstruction 07/15/2015   Hydronephrosis, bilateral 06/28/2015   Bladder outlet obstruction 06/28/2015   Acute encephalopathy 06/28/2015   Protein-calorie malnutrition, moderate 06/28/2015   UTI (urinary tract infection)  06/15/2015   Hyperthyroidism 06/15/2015   Bright red blood per rectum 06/14/2015   Renal failure 06/13/2015   AKI (acute kidney injury) 06/13/2015   Urinary retention 06/13/2015   Anorexia 06/13/2015    Leukocytosis 06/13/2015   Elevated blood pressure 06/13/2015   Anemia 06/13/2015   Generalized weakness    PCP:  Patient, No Pcp Per Pharmacy:   [id number] [place], [place] - [address] [place] [address] Phone: [phone] Fax: [phone]  [facility] - [place], [place] - [address] [place] [address] Phone: [phone] Fax: [phone]     Social Drivers of Health (SDOH) Social History: SDOH Screenings   Food Insecurity: No Food Insecurity (02/04/2024)  Housing: Low Risk  (02/04/2024)  Transportation Needs: No Transportation Needs (02/04/2024)  Utilities: Not At Risk (02/04/2024)  Social Connections: Moderately Isolated (02/04/2024)  Tobacco Use: High Risk (02/03/2024)   SDOH Interventions:     Readmission Risk Interventions     No data to display

## 2024-02-04 NOTE — ED Notes (Signed)
 Iv pump continues to beep occluded due to patient arm placement. IV's are in forearm but he curls his arms up tight. This paramedic has attempted several times to straighten his arm to allow it to flow. Pt becomes extremely agitated and states to leave me alone

## 2024-02-04 NOTE — Consult Note (Signed)
 Consultation Note Date: 02/04/2024   Patient Name: Jeff Parks  DOB: 05-28-1929  MRN: [id number]  Age / Sex: 89 y.o., male  PCP: Patient, No Pcp Per Referring Physician: Rosalynn Camie CROME, MD  Reason for Consultation: Establishing goals of care  HPI/Patient Profile: 89 y.o. male  with past medical history of hypothyroidism, bladder outlet obstruction, previous dialysis, CKD, DM2, anemia admitted on 02/03/2024 with hypotension, recurrent anemia identified during blood work at [facility].   In the ED, hemoglobin showed of 6.8.  Patient also met sepsis criteria with fever 100.7 and CT angio abdomen pelvis was obtained which showed lower lobe PE on the left with inflamed lymph nodes secondary to likely malignancy given symptoms of melena.  1 unit RBCs was given and patient was admitted for management of sepsis, acute on chronic anemia, PE.   PMT has been consulted to assist with goals of care conversation.  Clinical Assessment and Goals of Care:  I have reviewed medical records including EPIC notes, labs and imaging, discussed with RN, assessed the patient and then met at the bedside with patient's wife, son, daughter to discuss diagnosis prognosis, GOC, EOL wishes, disposition and options.  I introduced Palliative Medicine as specialized medical care for people living with serious illness. It focuses on providing relief from the symptoms and stress of a serious illness. The goal is to improve quality of life for both the patient and the family.  We discussed a brief life review of the patient and then focused on their current illness.  The natural disease trajectory and expectations at EOL were discussed.  I attempted to elicit values and goals of care important to the patient.    Medical History Review and Understanding:  We discussed patient's acute illness in the context of their chronic comorbidities.  Patient's family understand the severity of patient's illness.  Social  History: Patient has been married for 69 years.  He has 1 son and 1 daughter.  He greatly values his independence and being home.  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  He previously worked as a Curator.  Functional and Nutritional State: Family reports the patient has been in bed all day since recent admission and hardly eating during this time (9/11 to 9/23).  Palliative Symptoms: Urinary incontinence, frustration related to this  Code Status: Concepts specific to code status, artifical feeding and hydration, and rehospitalization were considered and discussed.  Patient shared during his stay at SNF that he would want to be a full code. Recommended consideration of DNR status, understanding evidenced-based poor outcomes in similar hospitalized patients, as the cause of the arrest is likely associated with chronic/terminal disease rather than a reversible acute cardio-pulmonary event.  Discussed that patient may feel differently now that he has a likely malignancy and encouraged patient's family to continue discussing with him as much as they are able.  Discussion: Clarified that patient would be a candidate for hospice at home despite preference for full code at this time, encouraging continued discussions with new information taken into consideration (malignancy).  Patient's family shared that goal at this time is to return patient home, let him be comfortable and put his care in God's hands for however long he has in-store for patient.  Patient's wife and son are cancer survivors themselves.  After review of hospice philosophy and different resources, they are ready for hospice referral.  They do note concern for patient's coverage, as he only has Medicare part A and  not part B.  I shared that it is likely he is still covered, though I would check with social work to make sure.  Patient does have a hospital bed already.  We discussed the timing of return  home and pending placement upstairs.  If possible, patient's family would like to avoid moving him upstairs and take him home sooner rather than later.   The difference between aggressive medical intervention and comfort care was considered in light of the patient's goals of care. Hospice and Palliative Care services outpatient were explained and offered.   Discussed the importance of continued conversation with family and the medical providers regarding overall plan of care and treatment options, ensuring decisions are within the context of the patient's values and GOCs.   Questions and concerns were addressed.  Hard Choices booklet left for review. The family was encouraged to call with questions or concerns.  PMT will continue to support holistically.   SUMMARY OF RECOMMENDATIONS   -Continue full code -Continue current care plan -Goal is to discharge home with hospice as soon as possible.  TOC and primary team notified.  Appreciate assistance with referral and coordination of disposition -Psychosocial and emotional support provided -PMT remains available as needed  Prognosis:  < 6 months  Discharge Planning: Home with Hospice      Primary Diagnoses: Present on Admission:  Sepsis [facility]   Physical Exam Vitals and nursing note reviewed.  Constitutional:      General: He is not in acute distress.    Appearance: He is ill-appearing.  HENT:     Head: Normocephalic and atraumatic.  Cardiovascular:     Rate and Rhythm: Normal rate.  Pulmonary:     Effort: Pulmonary effort is normal.  Neurological:     Mental Status: He is alert.  Psychiatric:        Behavior: Behavior is withdrawn.     Comments: Did not engage in conversation     Vital Signs: BP (!) 98/53   Pulse 79   Temp 97.9 F (36.6 C) (Oral)   Resp 20   SpO2 100%  Pain Scale: 0-10   Pain Score: 0-No pain   SpO2: SpO2:  (pt refuses to wear) O2 Device:SpO2:  (pt refuses to wear) O2 Flow Rate: .    Billing  based on MDM: High  Problems Addressed: One acute or chronic illness or injury that poses a threat to life or bodily function  Amount and/or Complexity of Data: Category 1:Review of prior external note(s) from each unique source, Review of the result(s) of each unique test, and Assessment requiring an independent historian(s), Category 2:Independent interpretation of a test performed by another physician/other qualified health care professional (not separately reported), and Category 3:Discussion of management or test interpretation with external physician/other qualified health care professional/appropriate source (not separately reported)  Risks: Decision not to resuscitate or to de-escalate care because of poor prognosis

## 2024-02-04 NOTE — Progress Notes (Signed)
 Pharmacy Antibiotic Note  Jeff Parks is a 89 y.o. male admitted on 02/03/2024 with pneumonia.  Pharmacy has been consulted for Vancomycin dosing. WBC is elevated. Noted renal dysfunction.   Plan: Vancomycin 750 mg IV q24h >>>Estimated AUC: 532 Zosyn per MD Trend WBC, temp, renal function  F/U infectious work-up Drug levels as indicated   Temp (24hrs), Avg:99.1 F (37.3 C), Min:98 F (36.7 C), Max:100.7 F (38.2 C)  Recent Labs  Lab 02/03/24 1921 02/03/24 2035 02/03/24 2250 02/04/24 0120  WBC  --  21.3*  --  19.8*  CREATININE 1.52*  --   --   --   LATICACIDVEN  --   --  1.2  --     Estimated Creatinine Clearance: 26.5 mL/min (A) (by C-G formula based on SCr of 1.52 mg/dL (H)).    No Known Allergies  Jeff Parks, PharmD, BCPS Clinical Pharmacist Phone: [phone]

## 2024-02-04 NOTE — ED Notes (Signed)
 CCMD called.

## 2024-02-04 NOTE — Assessment & Plan Note (Addendum)
 Respiratory status continues to be stable.  Rediscussed with wife and patient at bedside that we will hold off treatment at this time given his anemia.  Reengaged patient and wife and goals of care discussion today.  Wife believes his wish is to remain full code.  Patient has difficulty following conversation, so I defer to his wife as his primary decision-maker.  She ultimately would like to take him home and make him comfortable, but reiterates that his CODE STATUS should remain full. - Monitor respiratory status -P.m. H&H -Palliative to see today

## 2024-02-04 NOTE — ED Notes (Signed)
 Pt will not allow pulse ox monitoring. Became very agitated

## 2024-02-05 NOTE — Assessment & Plan Note (Signed)
 Patient with good urine output per nursing

## 2024-02-05 NOTE — Progress Notes (Signed)
     Daily Progress Note Intern Pager: [phone]  Patient name: Jeff Parks Medical record number: [id number] Date of birth: [birth year] Age: 89 y.o. Gender: male  Primary Care Provider: Patient, No Pcp Per Consultants: palliative care, hospice nurse Code Status: FULL  Pt Overview and Major Events to Date:  10/6 - admitted  Assessment and Plan:  89 year old male with past medical history of renal failure and BPH presenting with hypotension, anemia, and fever initially septic, now resolved.  Found to have pulmonary embolism in setting of significant anemia requiring blood transfusions.  CT findings concerning for renal mass with associated lymphadenopathy, suspect source of bleeding and anemia with clinical reports of hematuria.  Family opting to take patient home with home hospice. Assessment & Plan Sepsis without acute organ dysfunction (HCC) (Resolved: 02/05/2024) Continues to have hypotension, otherwise vitals are stable.  No labs collected this morning to assess leukocytosis given patient is opting for hospice care Anemia due to chronic blood loss Bladder mass Last hemoglobin was stable.  Patient without symptoms of anemia at this time. Pulmonary embolism (HCC) He is breathing comfortably on room air, no tachypnea.  Will not treat further at this time given coexisting anemia and patient election to go home with hospice Hydronephrosis, bilateral Patient with good urine output per nursing  FEN/GI: soft PPx: none Dispo:home with hospice today pending delivery of hospice supplies.  Subjective:  He does not have any pain or shortness of breath today.  specifically no pleuritic pain. asks for cold cup of water  with no other questions or concerns  Objective: Temp:  [98.2 F (36.8 C)-98.7 F (37.1 C)] 98.7 F (37.1 C) (10/08 0532) Pulse Rate:  [65-77] 77 (10/08 0532) Resp:  [15-21] 18 (10/08 0532) BP: (91-104)/(42-63) 91/42 (10/08 0532) SpO2:  [97 %-100 %] 100 % (10/08  0532) Physical Exam: General: Thin male lying comfortably in bed CV: Heart sounds are soft but regular rate and rhythm Pulm: Lungs clear to auscultation, no increased work of breathing Abdomen: Soft, nontender, nondistended  Laboratory: Most recent CBC Lab Results  Component Value Date   WBC 19.8 (H) 02/04/2024   HGB 7.9 (L) 02/04/2024   HCT 25.1 (L) 02/04/2024   MCV 89.0 02/04/2024   PLT 361 02/04/2024   Most recent BMP    Latest Ref Rng & Units 02/04/2024    5:00 AM  BMP  Glucose 70 - 99 mg/dL 888   BUN 8 - 23 mg/dL 39   Creatinine 9.38 - 1.24 mg/dL 8.30   Sodium 864 - 854 mmol/L 137   Potassium 3.5 - 5.1 mmol/L 4.4   Chloride 98 - 111 mmol/L 106   CO2 22 - 32 mmol/L 21   Calcium  8.9 - 10.3 mg/dL 8.0    Imaging/Diagnostic Tests: None  Jeff Parks, Elio, MD 02/05/2024, 7:23 AM  PGY-1, [facility] Health Family Medicine FPTS Intern pager: [phone], text pages welcome Secure chat group [facility] Teaching Service

## 2024-02-05 NOTE — Discharge Instructions (Addendum)
 Dear Jackee Gentry,  Thank you for letting us  participate in your care. You were hospitalized for a pulmonary embolism (blood clot in your lung) and no blood thinner was prescribed to treat the embolism in the setting of your low blood count for which you received blood. Your anemia/low blood count is likely because of blood from your urine which could be related to the bladder mass that you have.   You chose to go home with hospice based on the extent of your condition. We hope this is the most comfortable for you.  POST-HOSPITAL & CARE INSTRUCTIONS Follow up with hospice coordinator Go to your follow up appointments (listed below)   DOCTOR'S APPOINTMENT   No future appointments.  Follow-up Information     [facility] Follow up.   Specialty: Hospice and Palliative Medicine Why: [facility] will provide home hospice services. Contact information: [address] [facility] [place]  [address] [phone]                Take care and be well!  Family Medicine Teaching Service Inpatient Team [place]  [facility]  [address] [place], [place] [address] [phone]

## 2024-02-05 NOTE — Hospital Course (Signed)
 This is a 89 year old male with past medical history of renal failure and BPH presenting with hypotension, anemia, and fever found to have a pulmonary embolism and anemia suspected from urinary malignancy.  He was admitted to the family medicine teaching service and his hospital course is detailed here:  Sepsis, resolved Patient brought from SNF with hypotension and fever meeting septic criteria.  Initially thought he may have pneumonia, based on CT findings with pleural effusions; however, the patient had no focal lung findings.  Initial dose of vancomycin and Zosyn IV administered on 10/6, but discontinued based on patient's clinical presentation, afebrile status, and suspicion that hypotension was due to anemia and poor prognosis detailed below.  Anemia due to chronic blood loss Bladder mass, suspected malignancy Initial hemoglobin 8.2 dropped to 6.8, stable after 1 pRBC transfusions.  Patient with hematuria and CT abdomen pelvis showing a left posterior bladder base mass lesion suggesting transitional cell neoplasm with pelvic, retroperitoneal, and retrocrural lymphadenopathy, concerning for metastasis.  Discussed findings with patient and family regarding likely poor prognosis given patient's age and lymphadenopathy.  Patient and family elected to take him home with hospice.  Pulmonary embolism Patient breathing comfortably on room air without pleuritis.  Elected not to treat pulmonary embolism in the setting of anemia, discussed above.  Respiratory status monitored during admission, and patient stable.  PCP recommendations: Patient going home with hospice, does not need further PCP care

## 2024-02-05 NOTE — Assessment & Plan Note (Signed)
 Last hemoglobin was stable.  Patient without symptoms of anemia at this time.

## 2024-02-05 NOTE — Discharge Summary (Cosign Needed Addendum)
 Family Medicine Teaching [facility] Discharge Summary  Patient name: Jeff Parks Medical record number: [id number] Date of birth: 03-17-1930 Age: 89 y.o. Gender: male Date of Admission: 02/03/2024  Date of Discharge: 02/05/24 Admitting Physician: Damien Pinal, DO  Primary Care Provider: Patient, No Pcp Per Consultants: palliative care  Indication for Hospitalization: Sepsis, anemia   Discharge Diagnoses/Problem List:  Active Problems:   Pulmonary embolism (HCC)   Anemia due to chronic blood loss   Hydronephrosis, bilateral   Bladder mass   Sepsis (HCC)  The above problem list has been updated and reviewed for accuracy, including the initial reason for admission.   Brief Hospital Course:  This is a 89 year old male with past medical history of renal failure and BPH presenting with hypotension, anemia, and fever found to have a pulmonary embolism and anemia suspected from urinary malignancy.  He was admitted to the family medicine teaching service and his hospital course is detailed here:  Sepsis, resolved Patient brought from SNF with hypotension and fever meeting septic criteria.  Initially thought he may have pneumonia, based on CT findings with pleural effusions; however, the patient had no focal lung findings.  Initial dose of vancomycin and Zosyn IV administered on 10/6, but discontinued based on patient's clinical presentation, afebrile status, and suspicion that hypotension was due to anemia and poor prognosis detailed below.  Anemia due to chronic blood loss Bladder mass, suspected malignancy Initial hemoglobin 8.2 dropped to 6.8, stable after 1 pRBC transfusions.  Patient with hematuria and CT abdomen pelvis showing a left posterior bladder base mass lesion suggesting transitional cell neoplasm with pelvic, retroperitoneal, and retrocrural lymphadenopathy, concerning for metastasis.  Discussed findings with patient and family regarding likely poor prognosis given  patient's age and lymphadenopathy.  Patient and family elected to take him home with hospice.  Pulmonary embolism Patient breathing comfortably on room air without pleuritis.  Elected not to treat pulmonary embolism in the setting of anemia, discussed above.  Respiratory status monitored during admission, and patient stable.  PCP recommendations: Patient going home with hospice, follow up with PCP as needed  Results/Tests Pending at Time of Discharge:  Unresulted Labs (From admission, onward)     Start     Ordered   02/03/24 1902  CBC with Differential  Once,   STAT        02/03/24 1901           Disposition: home with hospice  Discharge Condition: stable  Discharge Exam:  Vitals:   02/05/24 0532 02/05/24 0724  BP: (!) 91/42 (!) 86/51  Pulse: 77 80  Resp: 18   Temp: 98.7 F (37.1 C) 99.4 F (37.4 C)  SpO2: 100% 100%   General: Thin male lying comfortably in bed CV: Heart sounds are soft but regular rate and rhythm Pulm: Lungs clear to auscultation, no increased work of breathing Abdomen: Soft, nontender, nondistended  Significant Procedures: none  Significant Labs and Imaging:  Recent Labs  Lab 02/03/24 2035 02/04/24 0120 02/04/24 1610  WBC 21.3* 19.8*  --   HGB 6.8* 7.5* 7.9*  HCT 22.0* 24.2* 25.1*  PLT 365 361  --    Recent Labs  Lab 02/03/24 1921 02/03/24 1929 02/04/24 0500  NA 135 138 137  K 4.7 5.0 4.4  CL 105  --  106  CO2 20*  --  21*  GLUCOSE 98  --  111*  BUN 46*  --  39*  CREATININE 1.52*  --  1.69*  CALCIUM  8.0*  --  8.0*  MG 1.9  --   --   ALKPHOS 67  --  66  AST 41  --  29  ALT 57*  --  41  ALBUMIN 1.7*  --  1.5*    CT Angio Abd/Pel W and/or Wo Contrast Result Date: 02/03/2024 CLINICAL DATA:  Acute anemia. Gastrointestinal bleeding. Altered mental status. EXAM: CTA ABDOMEN AND PELVIS WITHOUT AND WITH CONTRAST TECHNIQUE: Multidetector CT imaging of the abdomen and pelvis was performed using the standard protocol during bolus  administration of intravenous contrast. Multiplanar reconstructed images and MIPs were obtained and reviewed to evaluate the vascular anatomy. RADIATION DOSE REDUCTION: This exam was performed according to the departmental dose-optimization program which includes automated exposure control, adjustment of the mA and/or kV according to patient size and/or use of iterative reconstruction technique. CONTRAST:  75mL OMNIPAQUE  IOHEXOL  350 MG/ML SOLN COMPARISON:  01/23/2024 FINDINGS: VASCULAR Aorta: Moderate aortic calcifications. No aneurysm. No dissection or significant stenosis. Celiac: Patent without evidence of aneurysm, dissection, vasculitis or significant stenosis. SMA: Patent without evidence of aneurysm, dissection, vasculitis or significant stenosis. Renals: Both renal arteries are patent without evidence of aneurysm, dissection, vasculitis, fibromuscular dysplasia or significant stenosis. IMA: Patent without evidence of aneurysm, dissection, vasculitis or significant stenosis. Inflow: Patent without evidence of aneurysm, dissection, vasculitis or significant stenosis. Proximal Outflow: Bilateral common femoral and visualized portions of the superficial and profunda femoral arteries are patent without evidence of aneurysm, dissection, vasculitis or significant stenosis. Veins: No obvious venous abnormality within the limitations of this arterial phase study. Review of the MIP images confirms the above findings. NON-VASCULAR Lower chest: Filling defects demonstrated in the right lower lung pulmonary arteries consistent with acute pulmonary embolus. Small bilateral pleural effusions, greater on the right. Bilateral basilar atelectasis. Hepatobiliary: Multiple hepatic cysts, unchanged. No imaging follow-up is indicated. Cholelithiasis. No gallbladder wall thickening or edema. No bile duct dilatation. Pancreas: Unremarkable. No pancreatic ductal dilatation or surrounding inflammatory changes. Spleen: Normal in size  without focal abnormality. Adrenals/Urinary Tract: No adrenal gland nodules. Bilateral renal cysts are unchanged since prior study. No imaging follow-up is indicated. Largest includes a right lower pole cyst measuring 9.7 cm diameter. There is bilateral hydronephrosis and hydroureter to the level of the bladder. Diffuse bladder wall thickening with polypoid mass in the left bladder base measuring 2.9 cm diameter. Small amount of gas in the bladder could result from instrumentation or infection. Similar appearance to the bladder and kidneys compared with prior study. Stomach/Bowel: Stomach, small bowel, and colon are not abnormally distended. Stool scattered throughout the colon. No discrete wall thickening or inflammatory stranding. Appendix is normal. No intraluminal contrast extravasation or contrast pooling is seen. No focal site of active gastrointestinal hemorrhage is identified. Lymphatic: Lymphadenopathy in the retrocrural region, throughout the retroperitoneum, and in the pelvis bilaterally. Mild prominence of lymph nodes in the groin regions. Largest retroperitoneal lymph nodes measure up to about 2 cm short axis dimension. Lymphadenopathy is similar to prior study, possibly metastatic. Reproductive: Prostate gland is poorly demonstrated, possibly surgically absent. Other: Small amount of free fluid in the pelvis is possibly ascites or reactive. No loculated collections. Abdominal wall musculature appears intact. Diffuse edema in the subcutaneous fatty tissues. Musculoskeletal: Degenerative changes in the spine and hips. No acute bony abnormalities. IMPRESSION: VASCULAR Diffuse aortic atherosclerosis. No evidence of occlusion, dissection, or critical stenosis of the major vessels in the abdomen or pelvis. NON-VASCULAR 1. Positive examination for pulmonary embolus in the right lung base. 2. Bilateral pleural effusions with basilar  atelectasis, greater on the right. 3. Diffuse wall thickening of the bladder  left posterior bladder base mass lesion, suggesting transitional cell neoplasm possibly with superimposed infection. Gas in the bladder may be due to infection or instrumentation. Similar appearance to prior study. 4. Pelvic, retroperitoneal, and retrocrural lymphadenopathy, nonspecific but possibly metastatic. 5. Cholelithiasis. 6. Bilateral hydronephrosis and hydroureter. Similar appearance to previous study. This is likely due to reflux or bladder outlet obstruction. 7. Small amount of free fluid in the pelvis is likely reactive or ascites. 8. No focal site of active gastrointestinal bleeding is identified. Critical Value/emergent results were called by telephone at the time of interpretation on 02/03/2024 at 10:32 pm to provider [facility] , who verbally acknowledged these results. Electronically Signed   By: Elsie Gravely M.D.   On: 02/03/2024 22:36   CT Head Wo Contrast Result Date: 02/03/2024 CLINICAL DATA:  Altered mental status EXAM: CT HEAD WITHOUT CONTRAST TECHNIQUE: Contiguous axial images were obtained from the base of the skull through the vertex without intravenous contrast. RADIATION DOSE REDUCTION: This exam was performed according to the departmental dose-optimization program which includes automated exposure control, adjustment of the mA and/or kV according to patient size and/or use of iterative reconstruction technique. COMPARISON:  01/23/2024 FINDINGS: Brain: No evidence of acute infarction, hemorrhage, hydrocephalus, extra-axial collection or mass lesion/mass effect. Mild chronic white matter ischemic changes are noted. Vascular: No hyperdense vessel or unexpected calcification. Skull: Normal. Negative for fracture or focal lesion. Sinuses/Orbits: No acute finding. Other: None. IMPRESSION: Chronic white matter ischemic changes.  No acute abnormality noted. Electronically Signed   By: Oneil Devonshire M.D.   On: 02/03/2024 22:20   DG Chest 2 View Result Date: 02/03/2024 CLINICAL DATA:   hypotension EXAM: CHEST - 2 VIEW COMPARISON:  Chest x-ray 01/23/2024 FINDINGS: The heart and mediastinal contours are unchanged. Atherosclerotic plaque. Developing of right lower to mid lung zone consolidation and nodular-like density. No pulmonary edema. Interval development of trace right pleural effusion pleural effusion. No pneumothorax. No acute osseous abnormality. IMPRESSION: 1. Developing of right lower to mid lung zone consolidation as well as trace right pleural effusion. Followup PA and lateral chest X-ray is recommended in 3-4 weeks following therapy to ensure resolution. 2.  Aortic Atherosclerosis (ICD10-I70.0). Electronically Signed   By: Morgane  Naveau M.D.   On: 02/03/2024 19:51      Discharge Medications:  Allergies as of 02/05/2024   No Known Allergies      Medication List     STOP taking these medications    ferrous sulfate  325 (65 FE) MG tablet   folic acid  1 MG tablet Commonly known as: FOLVITE    multivitamin with minerals Tabs tablet   Pro-Stat Liqd       TAKE these medications    acetaminophen  500 MG tablet Commonly known as: TYLENOL  Take 500-1,000 mg by mouth every 6 (six) hours as needed for moderate pain (pain score 4-6).   bisacodyl 10 MG suppository Commonly known as: DULCOLAX Place 10 mg rectally daily as needed for moderate constipation.   Enema Ready-To-Use 7-19 GM/118ML Enem Place 1 Bottle rectally daily as needed (moderate constipation).   Milk of Magnesia 1200 MG/15ML suspension Generic drug: magnesium hydroxide Take 30 mLs by mouth daily as needed for mild constipation. Give 30ml by mouth  as needed for constipation -no BM in 3 days        Discharge Instructions: Please refer to Patient Instructions section of EMR for full details.  Patient was counseled important  signs and symptoms that should prompt return to medical care, changes in medications, dietary instructions, activity restrictions, and follow up appointments.   Follow-Up  Appointments:  Follow-up Information     [facility] Follow up.   Specialty: Hospice and Palliative Medicine Why: [facility] will provide home hospice services. Contact information: [address] [place]  [address] [phone]                Alena Morrison, Elio, MD 02/05/2024, 12:08 PM PGY-1, [facility] Family Medicine  I have discussed the above with Dr. Alena Morrison and agree with the documented plan. My edits for correction/addition/clarification are included above. Please see any attending notes.   Kathrine Melena, DO PGY-2, [facility] 02/05/2024 12:41 PM

## 2024-02-05 NOTE — Assessment & Plan Note (Addendum)
 He is breathing comfortably on room air, no tachypnea.  Will not treat further at this time given coexisting anemia and patient election to go home with hospice

## 2024-02-05 NOTE — Assessment & Plan Note (Signed)
 Continues to have hypotension, otherwise vitals are stable.  No labs collected this morning to assess leukocytosis given patient is opting for hospice care

## 2024-02-05 NOTE — Progress Notes (Signed)
 Transition of Care [facility]) - Inpatient Brief Assessment   Patient Details  Name: Jeff Parks MRN: [id number] Date of Birth: 22-Feb-1930  [facility]) CM/SW Contact:    Rosaline JONELLE Joe, RN Phone Number: 02/05/2024, 10:49 AM   Clinical Narrative: CM spoke with [facility] CM and patient's DME will be delivered to the home this afternoon.  The patient plans to return home by private vehicle.  [facility] home hospice will continue to follow the patient in the home for services.  MD state that the patient wishes to be full code.   Transition of Care Asessment: Insurance and Status: (P) Insurance coverage has been reviewed Patient has primary care physician: (P) Yes Home environment has been reviewed: (P) from home Prior level of function:: (P) family assistance at the home Prior/Current Home Services: (P) No current home services Social Drivers of Health Review: (P) SDOH reviewed interventions complete Readmission risk has been reviewed: (P) Yes Transition of care needs: (P) transition of care needs identified, TOC will continue to follow

## 2024-02-08 LAB — CULTURE, BLOOD (ROUTINE X 2)
Culture: NO GROWTH
Culture: NO GROWTH
Special Requests: ADEQUATE

## 2024-04-30 DEATH — deceased
